# Patient Record
Sex: Female | Born: 1980 | Hispanic: No | Smoking: Former smoker
Health system: Southern US, Community
[De-identification: ages and names within clinical notes are randomized; demographics above are authoritative.]

## PROBLEM LIST (undated history)

## (undated) DIAGNOSIS — F419 Anxiety disorder, unspecified: Secondary | ICD-10-CM

## (undated) DIAGNOSIS — M545 Low back pain, unspecified: Secondary | ICD-10-CM

## (undated) DIAGNOSIS — Z87442 Personal history of urinary calculi: Secondary | ICD-10-CM

## (undated) DIAGNOSIS — K429 Umbilical hernia without obstruction or gangrene: Secondary | ICD-10-CM

## (undated) DIAGNOSIS — R10A0 Flank pain, unspecified side: Secondary | ICD-10-CM

## (undated) DIAGNOSIS — R109 Unspecified abdominal pain: Secondary | ICD-10-CM

## (undated) DIAGNOSIS — R002 Palpitations: Secondary | ICD-10-CM

## (undated) HISTORY — DX: Personal history of urinary calculi: Z87.442

## (undated) HISTORY — PX: NOSE SURGERY: SHX723

## (undated) HISTORY — DX: Anxiety disorder, unspecified: F41.9

## (undated) HISTORY — PX: INDUCED ABORTION: SHX677

## (undated) HISTORY — PX: APPENDECTOMY: SHX54

## (undated) HISTORY — DX: Palpitations: R00.2

## (undated) HISTORY — DX: Flank pain, unspecified side: R10.A0

## (undated) HISTORY — DX: Unspecified abdominal pain: R10.9

## (undated) HISTORY — DX: Umbilical hernia without obstruction or gangrene: K42.9

## (undated) HISTORY — PX: COSMETIC SURGERY: SHX468

## (undated) HISTORY — PX: LIPOSUCTION: SHX10

## (undated) HISTORY — DX: Low back pain, unspecified: M54.50

---

## 2011-02-15 ENCOUNTER — Other Ambulatory Visit: Payer: Self-pay | Admitting: *Deleted

## 2011-02-15 DIAGNOSIS — N23 Unspecified renal colic: Secondary | ICD-10-CM

## 2011-02-16 ENCOUNTER — Other Ambulatory Visit: Payer: Self-pay

## 2011-02-17 ENCOUNTER — Ambulatory Visit
Admission: RE | Admit: 2011-02-17 | Discharge: 2011-02-17 | Disposition: A | Discharge status: Home / Self Care | Payer: Self-pay | Source: Ambulatory Visit | Attending: *Deleted | Admitting: *Deleted

## 2011-02-17 DIAGNOSIS — N23 Unspecified renal colic: Secondary | ICD-10-CM

## 2014-12-23 ENCOUNTER — Other Ambulatory Visit (HOSPITAL_COMMUNITY)
Admission: RE | Admit: 2014-12-23 | Discharge: 2014-12-23 | Disposition: A | Discharge status: Home / Self Care | Payer: Self-pay | Source: Ambulatory Visit | Attending: Family Medicine | Admitting: Family Medicine

## 2014-12-23 ENCOUNTER — Emergency Department (HOSPITAL_COMMUNITY)
Admission: EM | Admit: 2014-12-23 | Discharge: 2014-12-23 | Disposition: A | Discharge status: Home / Self Care | Payer: Self-pay | Source: Home / Self Care | Attending: Family Medicine | Admitting: Family Medicine

## 2014-12-23 ENCOUNTER — Encounter (HOSPITAL_COMMUNITY): Payer: Self-pay | Admitting: Emergency Medicine

## 2014-12-23 DIAGNOSIS — N760 Acute vaginitis: Secondary | ICD-10-CM

## 2014-12-23 DIAGNOSIS — N12 Tubulo-interstitial nephritis, not specified as acute or chronic: Secondary | ICD-10-CM

## 2014-12-23 DIAGNOSIS — N939 Abnormal uterine and vaginal bleeding, unspecified: Secondary | ICD-10-CM

## 2014-12-23 DIAGNOSIS — Z113 Encounter for screening for infections with a predominantly sexual mode of transmission: Secondary | ICD-10-CM | POA: Insufficient documentation

## 2014-12-23 DIAGNOSIS — N730 Acute parametritis and pelvic cellulitis: Secondary | ICD-10-CM

## 2014-12-23 LAB — POCT PREGNANCY, URINE: Preg Test, Ur: NEGATIVE

## 2014-12-23 LAB — POCT URINALYSIS DIP (DEVICE)
BILIRUBIN URINE: NEGATIVE
Glucose, UA: NEGATIVE mg/dL
KETONES UR: NEGATIVE mg/dL
Nitrite: POSITIVE — AB
Protein, ur: 100 mg/dL — AB
Specific Gravity, Urine: >=1.03 (ref 1.005–1.030)
Urobilinogen, UA: 0.2 mg/dL (ref 0.0–1.0)
pH: 6 (ref 5.0–8.0)

## 2014-12-23 MED ORDER — CEFTRIAXONE SODIUM 1 G IJ SOLR
1.0000 g | Freq: Once | INTRAMUSCULAR | Status: AC
  Administered 2014-12-23: 1 g via INTRAMUSCULAR

## 2014-12-23 MED ORDER — AZITHROMYCIN 250 MG PO TABS
ORAL_TABLET | ORAL | Status: AC
  Filled 2014-12-23: qty 4

## 2014-12-23 MED ORDER — CEFTRIAXONE SODIUM 1 G IJ SOLR
INTRAMUSCULAR | Status: AC
Start: 2014-12-23 — End: 2014-12-23
  Filled 2014-12-23: qty 10

## 2014-12-23 MED ORDER — LIDOCAINE HCL (PF) 1 % IJ SOLN
INTRAMUSCULAR | Status: AC
  Filled 2014-12-23: qty 5

## 2014-12-23 MED ORDER — AZITHROMYCIN 250 MG PO TABS
1000.0000 mg | ORAL_TABLET | Freq: Every day | ORAL | Status: DC
  Administered 2014-12-23: 1000 mg via ORAL

## 2014-12-23 MED ORDER — CEPHALEXIN 500 MG PO CAPS: 500.0000 mg | ORAL_CAPSULE | Freq: Four times a day (QID) | ORAL | Status: DC

## 2014-12-23 MED ORDER — TRAMADOL HCL 50 MG PO TABS: ORAL_TABLET | ORAL | Status: DC

## 2014-12-23 NOTE — ED Notes (Addendum)
C/o RLQ pain that radiates to right flank x1 week Also reports white vag d/c and fevers; d/c has a foul odor to it Denies dysuria, hematuria, n/v Alert, no signs of acute distress.

## 2014-12-23 NOTE — Discharge Instructions (Signed)
Cervicitis (Cervicitis) Es el dolor e hinchazn (inflamacin) del cuello uterino. El cuello del tero se encuentra en la parte inferior del tero. Se abre hacia la vagina. CAUSAS   Enfermedades de transmisin sexual (ETS).   Reacciones alrgicas.   Medicamentos o dispositivos anticonceptivos que se Research officer, trade unioncolocan en la vagina.   Traumatismo en el cuello del tero.   Infecciones bacterianas.  FACTORES DE RIESGO Usted tendr mayor riesgo de sufrir esta enfermedad si:  Tiene relaciones sexuales sin proteccin.  Ha tenido relaciones sexuales con varias parejas.  Comienza a Management consultanttener relaciones sexuales a una edad temprana.  Tiene una historia de ETS. SNTOMAS  Puede ser que no haya sntomas. Si hay sntomas, pueden ser:   Secrecin vaginal de color gris, blanco, amarillo o que tiene mal olor.   Picazn o dolor en la zona externa de la vagina.   Relaciones sexuales dolorosas.   Dolor en la zona baja del abdomen o la espalda, especialmente durante las The St. Paul Travelersrelaciones sexuales.   Ganas de orinar con frecuencia.   Sangrado vaginal anormal entre perodos, despus de las relaciones sexuales o despus de la menopausia.   Sensacin de presin o pesadez en la pelvis.  DIAGNSTICO  El diagnstico se realiza mediante un examen plvico. Otras pruebas son:   Examen de las secreciones en el microscopio (preparado fresco).   Papanicolau  TRATAMIENTO  El tratamiento depender de la causa de la cervicitis. Si la causa es una enfermedad de transmisin sexual, usted y su pareja necesitarn Pensions consultantrealizar un tratamiento. Le prescribirn antibiticos.  INSTRUCCIONES PARA EL CUIDADO EN EL HOGAR   No tenga relaciones sexuales hasta que el mdico la autorice.   No tenga relaciones sexuales hasta que su compaero haya sido tratado si la causa de la cervicitis es una enfermedad de transmisin sexual.   Tome los antibiticos como se le indic. Tmelos todos, aunque se sienta mejor.  SOLICITE  ATENCIN MDICA SI:  Los sntomas vuelven a Research officer, trade unionaparecer.   Tiene fiebre.  ASEGRESE DE QUE:   Comprende estas instrucciones.  Controlar su afeccin.  Recibir ayuda de inmediato si no mejora o si empeora. Document Released: 10/30/2005 Document Revised: 07/02/2013 [facility] Patient Information 2015 [place], [place]. This information is not intended to replace advice given to you by your health care provider. Make sure you discuss any questions you have with your health care provider.  Enfermedad inflamatoria plvica (Pelvic Inflammatory Disease)  La enfermedad inflamatoria plvica (PID) se refiere a una infeccin en algunos o todos de los rganos femeninos. La infeccin puede estar en el tero, en los ovarios, en las trompas de [place] o en los tejidos circundantes en la pelvis. La PID puede causar un dolor abdominal o plvico que aparece repentinamente (dolor plvico agudo). La PID es una infeccin grave, ya que puede conducir a un dolor plvico duradero (crnico) o a la imposibilidad de Warehouse managertener hijos (infertilidad).  CAUSAS  La infeccin generalmente es causada por las bacterias normales que se encuentran en los tejidos vaginales. Otras causas son las infecciones que se transmiten durante el contacto sexual. Tambin puede aparecer despus de:   El nacimiento de un beb.   Un aborto espontneo.   Un aborto inducido.   Una ciruga plvica mayor.   El uso de un dispositivo intrauterino (DIU).   Una violacin sexual.  FACTORES DE RIESGO  Ciertos factores pueden aumentar el riesgo de PID, por ejemplo:   Ser menor de 25 aos de edad.  Ser sexualmente activa a una edad temprana.  El Home Gardensuso de  anticonceptivos que no son de barrera.  Tener mltiples compaeros sexuales.  Tener relaciones sexuales con alguien que tiene sntomas de una infeccin genital.  El uso de anticonceptivos orales. Otras veces, ciertos factores pueden aumentar la posibilidad de sufrir una PID, por ejemplo:     Tener relaciones sexuales durante la menstruacin.  Usar una ducha vaginal.  Tener un DIU (dispositivo intrauterino) en su lugar. SNTOMAS   Dolor abdominal o plvico   Fiebre.   Escalofros.   Flujo vaginal anormal.  Sangrado uterino anormal.   Dolor inusual poco despus de finalizado el perodo. DIAGNSTICO  El mdico decidir algunos de los siguientes mtodos para hacer el diagnstico:   Education officer, environmental un examen fsico y Neomia Dear historia clnica. El examen plvico muestra el tero y esa zona de la pelvis muy sensibles.   Pruebas de laboratorio, como un test de [place], [place] de [place] y de [place].  Cultivos de la vagina y del cuello uterino para Engineer, manufacturing una infeccin de transmisin sexual (ITS).  Realizar una ecografa.  Una laparoscopia para observar el interior de la pelvis.  TRATAMIENTO   Se pueden prescribir antibiticos por va oral.   Las parejas sexuales deben tratarse cuando la infeccin es [place] en una enfermedad de transmisin sexual (ETS).   Puede que necesite ser hospitalizada para aplicarle los antibiticos por va intravenosa.  En algunos casos poco frecuentes es necesaria la [place]. Pueden pasar semanas hasta la completa curacin. Si se le diagnostica una PID, tambin debern realizarle estudios para descartar el virus de inmunodeficiencia humana (VIH).  INSTRUCCIONES PARA EL CUIDADO EN EL HOGAR   Tome los antibiticos como le indic el mdico, si se los receta. Finalice el [place], aunque comience a Actor.   Slo tome medicamentos de venta libre o recetados para Primary school teacher, las molestias o bajar la fiebre segn las indicaciones de su mdico.   No tenga relaciones sexuales hasta completar el [place], o segn las indicaciones del mdico. Si se confirma la PID, sus compaeros sexuales recientes Therapist, occupational.   Cumpla con las citas tal como se le indic. SOLICITE ATENCIN MDICA SI:   Brett Fairy secrecin vaginal anormal o abundante.   Necesita medicamentos recetados para Chief Technology Officer.   Vomita.   No puede tomar los medicamentos.   Su pareja tiene una enfermedad de transmisin sexual.  SOLICITE ATENCIN MDICA DE Lanney Gins SI:   Lance Muss.   Aumenta el dolor abdominal o plvico.   Siente escalofros.   Siente dolor al [employer].   No mejora luego de 72 horas del tratamiento.  ASEGRESE DE QUE:   Comprende estas instrucciones.  Controlar su enfermedad.  Solicitar ayuda de inmediato si no mejora o si empeora. Document Released: 08/09/2005 Document Revised: 02/24/2013 [facility] Patient Information 2015 [place], [place]. This information is not intended to replace advice given to you by your health care provider. Make sure you discuss any questions you have with your health care provider.  Pielonefritis - Adultos  (Pyelonephritis, Adult)  La pielonefritis es una infeccin del rin. Hay dos tipos principales de pielonefritis:   Una infeccin que se inicia rpidamente sin sntomas previos (pielonefritis [place]).  Infecciones que persisten por un largo perodo (pielonefritis crnica). CAUSAS  Hay dos causas principales:   Pasaje de bacterias desde la vejiga al rin. Este problema aparece especialmente en mujeres embarazadas. La orina en la vejiga puede infectarse por diferentes causas, por ejemplo:  Inflamacin de la prstata (prostatitis).  Durante las [employer].  Infeccin en la vejiga (cistitis).  Pasaje de bacterias desde la sangre hacia el rin. Las enfermedades que aumentan el riesgo son:   Diabetes.  Clculos renales o en la vescula.  Cncer.  Un catter colocado en la vejiga.  Otras anormalidades del rin o de Engineer, mining. SNTOMAS   Dolor abdominal  Dolor en la zona del costado o flanco.  Grant Ruts.  Escalofros.  Malestar estomacal.  Sangre en la orina Larose Kells).  Necesidad frecuente de  orinar  Necesidad intensa o persistente de Geographical information systems officer.  Sensacin de ardor o pinchazos al [employer]. DIAGNSTICO  El mdico diagnosticar una infeccin en su rin basndose en los sntomas. Tambin tomar [place] de [place].  TRATAMIENTO  Generalmente el tratamiento depende de la gravedad de la infeccin.   Si la infeccin es leve y se diagnostica a tiempo, el mdico lo tratar con antibiticos por va oral y lo dejar irse a su casa.  Si la infeccin es ms grave, la bacteria podra haber ingresado al torrente sanguneo. Esto requerir antibiticos por va intravenosa y Administrator, arts en el hospital. Los sntomas pueden incluir:  Fiebre alta.  Dolor intenso en un costado del cuerpo.  Escalofros  An despus de Financial risk analyst hospital, el mdico podr indicarle antibiticos por va oral durante cierto perodo de [place].  Podr prescribirle otros tratamientos segn la causa de la infeccin. INSTRUCCIONES PARA EL CUIDADO EN EL HOGAR   Tome los antibiticos como se le indic. Tmelos todos, aunque se sienta mejor.  Concurra para Education officer, environmental un control y asegurarse de que la infeccin ha desaparecido.  Debe ingerir gran cantidad de lquido para mantener la orina de tono claro o color amarillo plido.  Tome medicamentos para la vejiga si siente urgencia para Geographical information systems officer o lo hace con mucha frecuencia. SOLICITE ATENCIN MDICA DE INMEDIATO SI:   Tiene fiebre o sntomas persistentes durante ms de 2  3 das.  Tiene fiebre y los sntomas [address].  No puede tomar los antibiticos ni ingerir lquidos.  Comienza a sentir escalofros.  Siente debilidad extrema o se desmaya.  No mejora despus de 2 das de [place]. ASEGRESE DE QUE:   Comprende estas instrucciones.  Controlar su enfermedad.  Solicitar ayuda de inmediato si no mejora o empeora. Document Released: 08/09/2005 Document Revised: 04/30/2012 [facility] Patient Information 2015 [place], [place]. This information is  not intended to replace advice given to you by your health care provider. Make sure you discuss any questions you have with your health care provider.  Infeccin urinaria  (Urinary Tract Infection)  La infeccin urinaria puede ocurrir en Corporate treasurer del tracto urinario. El tracto urinario es un sistema de drenaje del cuerpo por el que se eliminan los desechos y el exceso de [place]. El tracto urinario est formado por dos riones, dos urteres, la vejiga y Engineer, mining. Los riones son rganos que tienen forma de frijol. Cada rin tiene aproximadamente el tamao del puo. Estn situados debajo de las [place], uno a cada lado de la columna vertebral CAUSAS  La causa de la infeccin son los microbios, que son organismos microscpicos, que incluyen hongos, virus, y bacterias. Estos organismos son tan pequeos que slo pueden verse a travs del microscopio. Las bacterias son los microorganismos que ms comnmente causan infecciones urinarias.  SNTOMAS  Los sntomas pueden variar segn la edad y el sexo del paciente y por la ubicacin de la infeccin. Los sntomas en las mujeres jvenes incluyen la necesidad frecuente e intensa de orinar y Neomia Dear sensacin dolorosa  de ardor en la vejiga o en la uretra durante la miccin. Las mujeres y los hombres mayores podrn sentir cansancio, temblores y debilidad y Futures trader musculares y Engineer, mining abdominal. Si tiene [place], puede significar que la infeccin est en los riones. Otros sntomas son dolor en la espalda o en los lados debajo de las [place], nuseas y vmitos.  DIAGNSTICO  Para diagnosticar una infeccin urinaria, el mdico le preguntar acerca de sus sntomas. [employer] una [place] de [place]. La muestra de orina se analiza para Engineer, manufacturing bacterias y glbulos blancos de Risk manager. Los glbulos blancos se forman en el organismo para ayudar a Artist las infecciones.  TRATAMIENTO  Por lo general, las infecciones urinarias pueden tratarse con  medicamentos. Debido a que la [employer] de las infecciones son causadas por bacterias, por lo general pueden tratarse con antibiticos. La eleccin del antibitico y la duracin del tratamiento depender de sus sntomas y el tipo de bacteria causante de la infeccin.  INSTRUCCIONES PARA EL CUIDADO EN EL HOGAR   Si le recetaron antibiticos, tmelos exactamente como su mdico le indique. Termine el medicamento aunque se sienta mejor despus de haber tomado slo algunos.  Beba gran cantidad de lquido para mantener la orina de tono claro o color amarillo plido.  Evite la cafena, el t y las [address]. Estas sustancias irritan la vejiga.  Vaciar la vejiga con frecuencia. Evite retener la orina durante largos perodos.  Vace la vejiga antes y despus de Management consultant.  Despus de mover el intestino, las mujeres deben higienizarse la regin perineal desde adelante hacia atrs. Use slo un papel tissue por vez. SOLICITE ATENCIN MDICA SI:   Siente dolor en la espalda.  Le sube la fiebre.  Los sntomas no mejoran luego de [address]. SOLICITE ATENCIN MDICA DE INMEDIATO SI:   Siente dolor intenso en la espalda o en la zona inferior del abdomen.  Comienza a sentir escalofros.  Tiene nuseas o vmitos.  Tiene una sensacin continua de quemazn o molestias al [employer]. ASEGRESE DE QUE:   Comprende estas instrucciones.  Controlar su enfermedad.  Solicitar ayuda de inmediato si no mejora o empeora. Document Released: 08/09/2005 Document Revised: 07/24/2012 [facility] Patient Information 2015 [place], [place]. This information is not intended to replace advice given to you by your health care provider. Make sure you discuss any questions you have with your health care provider.  Vaginitis  (Vaginitis)  La vaginitis es la inflamacin de la vagina. Generalmente se debe a un cambio en el equilibrio normal de las bacterias y hongos que viven en la vagina. Este cambio en el  equilibrio causa un crecimiento excesivo de ciertas bacterias y hongos, lo que causa la inflamacin. Hay diferentes tipos de vaginitis, pero los ms comunes son:   Vaginitis bacteriana.  Infeccin por hongos (candidiasis).  Vaginitis por tricomoniasis. Esta es una enfermedad de transmisin sexual (ETS).  Vaginitis viral.  Vaginitis atrfica.  Vaginitis alrgica. CAUSAS  El tratamiento depende del tipo de vaginitis. Las causas pueden ser:   Bacterias (vaginitis bacteriana).  Hongos (infeccin por hongos).  Parsitos (vaginitis por tricomoniasis).  Virus (vaginitis viral).  Niveles hormonales bajos (vaginitis atrfica). Los niveles bajos de hormonas pueden ocurrir durante el Psychiatrist, la Market researcher o despus de la menopausia.  Irritantes, como los baos de [place], los tampones perfumados y los aerosoles femeninos (vaginitis Counselling psychologist). Otros factores pueden alterar el equilibrio normal de los hongos y las bacterias que viven en la vagina. Ellos son:   Antibiticos.  Higiene personal deficiente.  Diafragmas, esponjas vaginales, espermicidas, pldoras anticonceptivas y dispositivos intrauterinos (DIU).  Las [employer].  Infecciones.  La diabetes no controlada.  Tener un sistema inmunolgico debilitado. SNTOMAS  Los sntomas pueden variar segn la causa de la vaginitis. Los sntomas ms comunes son:   Flujo vaginal anormal.  La secrecin es de color blanco, gris o amarillento en la vaginitis bacteriana.  La secrecin es espesa, blanca y con apariencia de queso en la infeccin por hongos.  La secrecin es espumosa y de color amarillo o verdoso en la tricomoniasis.  Mal olor vaginal.  En la vaginitis bacteriana puede haber olor a "pescado".  Picazn, dolor o hinchazn vaginal.  Relaciones sexuales dolorosas.  Dolor o ardor al Geographical information systems officer. En ocasiones puede no haber sntomas.  TRATAMIENTO  El tratamiento depende de la gravedad de la lesin.   La vaginitis  bacteriana y la tricomoniasis a menudo se tratan con cremas o comprimidos antibiticos.  Las infecciones por hongos se tratan con medicamentos antifngicos, como cremas o supositorios vaginales.  La vaginitis viral no tiene [place], [employer] los sntomas pueden tratarse con medicamentos que [place] [employer]. Su pareja sexual tambin debe ser tratarse.  La vaginitis atrfica puede tratarse con crema, comprimidos, supositorios, o anillo vaginal con estrgenos. Si tiene sequedad vaginal, los lubricantes y las cremas hidratantes pueden ayudarla. Posiblemente le pedirn que evite los [place], aerosoles o duchas perfumados.  El tratamiento de la vaginitis alrgica implica renunciar al uso del producto que est causando el problema. Las cremas vaginales pueden usarse para tratar los sntomas. INSTRUCCIONES PARA EL CUIDADO EN EL HOGAR   Tome todos los medicamentos segn le indic su mdico.  Mantenga la zona vaginal limpia y seca. Evite el jabn y slo enjuague el rea con agua.  Evite la ducha vaginal. Puede eliminar las bacterias saludables que hay en la vagina.  No utilice tampones ni tenga relaciones sexuales hasta que el profesional la autorice. No use apsitos mientras tenga vaginitis.  Higiencese de adelante hacia atrs. Esto evita la propagacin de bacterias desde el recto hacia la vagina.  Deje que el aire llegue a su rea genital.  Use ropa interior de algodn para reducir la acumulacin de humedad.  Evite el uso de ropa interior cuando duerme hasta que la vaginitis haya mejorado.  Evite la ropa interior o medias de nylon ajustadas y que no tengan un panel de algodn.  Qutese la ropa hmeda (especialmente el traje de bao) tan pronto como sea posible.  Utilice productos suaves sin perfume. Evite el uso de sustancias irritantes como:  Aerosoles femeninos perfumados.  Suavizantes de tela.  Detergentes perfumados.  Tampones perfumados.  Jabones o baos de espuma  perfumados.  Practique el sexo seguro y use condones. Los condones pueden prevenir la transmisin de la tricomoniasis y la vaginitis viral. Romona Curls ATENCIN MDICA SI:   Siente dolor abdominal.  Tiene fiebre o sntomas persistentes durante ms de 2  3 das.  Tiene fiebre y los sntomas empeoran repentinamente. Document Released: 02/15/2009 Document Revised: 07/24/2012 [facility] Patient Information 2015 [place], [place]. This information is not intended to replace advice given to you by your health care provider. Make sure you discuss any questions you have with your health care provider.

## 2014-12-23 NOTE — ED Provider Notes (Signed)
CSN: [id number]     Arrival date & time 12/23/14  1621 History   First MD Initiated Contact with Patient 12/23/14 1654     Chief Complaint  Patient presents with  . Abdominal Pain   (Consider location/radiation/quality/duration/timing/severity/associated sxs/prior Treatment) HPI Comments: 34 year old female states that she has had pain across the lower back for 3 days. It was primarily to the right side and migrated to the left. She states it is worse it was 10 out of 10. After taking ibuprofen this afternoon it is now 8 out of 10. It is often worse with movement such as leaning forward and with ambulation. She is complaining of recurrent vaginal discharge. She has been treated for vaginal discharge and urinary tract infection several times over the past few months. In December she had an elective abortion. She denies dysuria. Her LMP was 1 week ago. At the end of the exam and decision making portion the patient stated that she took a "day after peel" 2 days ago.   History reviewed. No pertinent past medical history. History reviewed. No pertinent past surgical history. No family history on file. History  Substance Use Topics  . Smoking status: Current Every Day Smoker  . Smokeless tobacco: Not on file  . Alcohol Use: Yes   OB History    No data available     Review of Systems  Constitutional: Negative for fever, chills and activity change.  HENT: Negative.   Respiratory: Negative.   Cardiovascular: Negative for chest pain.  Gastrointestinal: Positive for abdominal pain and constipation. Negative for vomiting.  Genitourinary: Positive for flank pain, vaginal bleeding, vaginal discharge and pelvic pain. Negative for dysuria, urgency, frequency and menstrual problem.  Skin: Negative.   Neurological: Negative.     Allergies  Review of patient's allergies indicates no known allergies.  Home Medications   Prior to Admission medications   Medication Sig Start Date End Date Taking?  Authorizing Provider  cephALEXin (KEFLEX) 500 MG capsule Take 1 capsule (500 mg total) by mouth 4 (four) times daily. 12/23/14   Hayden Rasmussen, NP  traMADol (ULTRAM) 50 MG tablet 1-2 tabs po q 6 hr prn pain Maximum dose= 8 tablets per day 12/23/14   Hayden Rasmussen, NP   BP 108/70 mmHg  Pulse 80  Temp(Src) 98.2 F (36.8 C) (Oral)  Resp 16  SpO2 99%  LMP 12/17/2014 Physical Exam  Constitutional: She is oriented to person, place, and time. She appears well-developed and well-nourished. No distress.  Neck: Normal range of motion. Neck supple.  Cardiovascular: Normal rate, regular rhythm and normal heart sounds.   Pulmonary/Chest: Effort normal and breath sounds normal. No respiratory distress.  No tenderness to palpation to the back. Positive CVA tenderness bilaterally.  Abdominal: Soft. Bowel sounds are normal. She exhibits no distension and no mass. There is no rebound and no guarding.  Abdomen flat Minor generalized tenderness to include the right upper quadrant. Mild tenderness to the anterior pelvis.  Genitourinary:  Normal external female genitalia. There is a moderate amount of dark maroon blood with clots in the vaginal vault. The cervix is small and right of the midline. There is blood oozing slowly from the os. No cervical/echo cervical lesions seen. The blood was removed with Fox swabs. Bimanual reveals bilateral adnexal tenderness and cervical motion tenderness. There was a scant amount of white vaginal discharge but primarily deluded by the amount of blood.  Musculoskeletal: She exhibits no edema.  Neurological: She is alert and oriented to person, place,  and time. She exhibits normal muscle tone.  Skin: Skin is warm and dry.  Psychiatric: She has a normal mood and affect.  Nursing note and vitals reviewed.   ED Course  Procedures (including critical care time) Labs Review Labs Reviewed  POCT URINALYSIS DIP (DEVICE) - Abnormal; Notable for the following:    Hgb urine dipstick  MODERATE (*)    Protein, ur 100 (*)    Nitrite POSITIVE (*)    Leukocytes, UA LARGE (*)    All other components within normal limits  URINE CULTURE  POCT PREGNANCY, URINE  CERVICOVAGINAL ANCILLARY ONLY    Imaging Review No results found. Results for orders placed or performed during the hospital encounter of 12/23/14  POCT urinalysis dip (device)  Result Value Ref Range   Glucose, UA NEGATIVE NEGATIVE mg/dL   Bilirubin Urine NEGATIVE NEGATIVE   Ketones, ur NEGATIVE NEGATIVE mg/dL   Specific Gravity, Urine >=1.030 1.005 - 1.030   Hgb urine dipstick MODERATE (A) NEGATIVE   pH 6.0 5.0 - 8.0   Protein, ur 100 (A) NEGATIVE mg/dL   Urobilinogen, UA 0.2 0.0 - 1.0 mg/dL   Nitrite POSITIVE (A) NEGATIVE   Leukocytes, UA LARGE (A) NEGATIVE  Pregnancy, urine POC  Result Value Ref Range   Preg Test, Ur NEGATIVE NEGATIVE   Preg HCG negative.    MDM   1. Pyelonephritis   2. PID (acute pelvic inflammatory disease)   3. Recurrent vaginitis   4. Abnormal uterine bleeding (AUB)    Rocephin 1 g IM Azithromycin 1 g by mouth Prescription for Keflex 4 times a day Urine culture pending Cervical cytology pending Drink plenty of fluids, stay well-hydrated , Tramadol 50 mg #20 May take ibuprofen for pain when necessary Call your doctor for follow-up appointment to see later this week or early next week. For any worsening new symptoms or problems such as increased pain, increased bleeding, fevers or other symptoms [facility].    Hayden Rasmussenavid Fines Kimberlin, NP 12/23/14 [id number]

## 2014-12-24 LAB — CERVICOVAGINAL ANCILLARY ONLY
CHLAMYDIA, DNA PROBE: NEGATIVE
Neisseria Gonorrhea: NEGATIVE

## 2014-12-25 LAB — CERVICOVAGINAL ANCILLARY ONLY
WET PREP (BD AFFIRM): NEGATIVE
WET PREP (BD AFFIRM): NEGATIVE
Wet Prep (BD Affirm): NEGATIVE

## 2015-01-01 ENCOUNTER — Emergency Department (HOSPITAL_COMMUNITY)
Admission: EM | Admit: 2015-01-01 | Discharge: 2015-01-02 | Disposition: A | Discharge status: Home / Self Care | Payer: PRIVATE HEALTH INSURANCE | Attending: Emergency Medicine | Admitting: Emergency Medicine

## 2015-01-01 ENCOUNTER — Encounter (HOSPITAL_COMMUNITY): Payer: Self-pay | Admitting: Emergency Medicine

## 2015-01-01 DIAGNOSIS — B373 Candidiasis of vulva and vagina: Secondary | ICD-10-CM | POA: Insufficient documentation

## 2015-01-01 DIAGNOSIS — B3731 Acute candidiasis of vulva and vagina: Secondary | ICD-10-CM

## 2015-01-01 DIAGNOSIS — Z792 Long term (current) use of antibiotics: Secondary | ICD-10-CM | POA: Diagnosis not present

## 2015-01-01 DIAGNOSIS — Z3202 Encounter for pregnancy test, result negative: Secondary | ICD-10-CM | POA: Insufficient documentation

## 2015-01-01 DIAGNOSIS — R300 Dysuria: Secondary | ICD-10-CM | POA: Diagnosis present

## 2015-01-01 LAB — CBC
HCT: 39.5 % (ref 36.0–46.0)
HEMOGLOBIN: 12.9 g/dL (ref 12.0–15.0)
MCH: 30 pg (ref 26.0–34.0)
MCHC: 32.7 g/dL (ref 30.0–36.0)
MCV: 91.9 fL (ref 78.0–100.0)
PLATELETS: 230 10*3/uL (ref 150–400)
RBC: 4.3 MIL/uL (ref 3.87–5.11)
RDW: 13.1 % (ref 11.5–15.5)
WBC: 7.2 10*3/uL (ref 4.0–10.5)

## 2015-01-01 LAB — URINALYSIS, ROUTINE W REFLEX MICROSCOPIC
BILIRUBIN URINE: NEGATIVE
Glucose, UA: NEGATIVE mg/dL
Hgb urine dipstick: NEGATIVE
KETONES UR: NEGATIVE mg/dL
LEUKOCYTES UA: NEGATIVE
NITRITE: NEGATIVE
PROTEIN: NEGATIVE mg/dL
Specific Gravity, Urine: 1.021 (ref 1.005–1.030)
Urobilinogen, UA: 0.2 mg/dL (ref 0.0–1.0)
pH: 6.5 (ref 5.0–8.0)

## 2015-01-01 LAB — COMPREHENSIVE METABOLIC PANEL
ALT: 31 U/L (ref 0–35)
ANION GAP: 6 (ref 5–15)
AST: 21 U/L (ref 0–37)
Albumin: 4 g/dL (ref 3.5–5.2)
Alkaline Phosphatase: 55 U/L (ref 39–117)
BUN: 16 mg/dL (ref 6–23)
CALCIUM: 8.9 mg/dL (ref 8.4–10.5)
CO2: 23 mmol/L (ref 19–32)
Chloride: 107 mmol/L (ref 96–112)
Creatinine, Ser: 0.65 mg/dL (ref 0.50–1.10)
GFR calc non Af Amer: >90 mL/min (ref >90)
GLUCOSE: 92 mg/dL (ref 70–99)
Potassium: 3.9 mmol/L (ref 3.5–5.1)
Sodium: 136 mmol/L (ref 135–145)
TOTAL PROTEIN: 7.9 g/dL (ref 6.0–8.3)
Total Bilirubin: 0.3 mg/dL (ref 0.3–1.2)

## 2015-01-01 LAB — WET PREP, GENITAL
CLUE CELLS WET PREP: NONE SEEN
Trich, Wet Prep: NONE SEEN

## 2015-01-01 LAB — PREGNANCY, URINE: Preg Test, Ur: NEGATIVE

## 2015-01-01 MED ORDER — FLUCONAZOLE 150 MG PO TABS
150.0000 mg | ORAL_TABLET | Freq: Once | ORAL | Status: AC
  Administered 2015-01-01: 150 mg via ORAL
  Filled 2015-01-01: qty 1

## 2015-01-01 NOTE — ED Notes (Signed)
PA at bedside.

## 2015-01-01 NOTE — ED Provider Notes (Signed)
CSN: [id number]     Arrival date & time 01/01/15  1758 History   First MD Initiated Contact with Patient 01/01/15 2002     Chief Complaint  Patient presents with  . Vaginal Discharge  . Dysuria   HPI  Patient is a 34 year old with no past medical history presents emergency room for evaluation of dysuria and vaginal discharge. Patient states that last week she was seen and evaluated by an outside doctor who diagnosed her with a UTI versus early pyelonephritis. Patient was given Toradol and also Keflex. She states that she has been taking her medications. She has 2 doses of Keflex left. She has noticed that for the past couple days she has been having an increasing white vaginal discharge. She also has noted that the dysuria has returned. Patient states that she has a history of yeast infections with antibiotic use. She is requesting STD testing as well. Patient denies fevers, chills, nausea, vomiting, abdominal pain, diarrhea, melena, hematochezia.  History reviewed. No pertinent past medical history. History reviewed. No pertinent past surgical history. No family history on file. History  Substance Use Topics  . Smoking status: Never Smoker   . Smokeless tobacco: Not on file  . Alcohol Use: No   OB History    No data available     Review of Systems  Respiratory: Negative for chest tightness and shortness of breath.   Cardiovascular: Negative for chest pain and palpitations.  Gastrointestinal: Negative for nausea, vomiting, abdominal pain, diarrhea and constipation.  Genitourinary: Positive for dysuria, urgency, frequency and vaginal discharge. Negative for hematuria, flank pain, decreased urine volume, vaginal bleeding, vaginal pain and pelvic pain.  All other systems reviewed and are negative.     Allergies  Review of patient's allergies indicates no known allergies.  Home Medications   Prior to Admission medications   Medication Sig Start Date End Date Taking? Authorizing  Provider  cephALEXin (KEFLEX) 500 MG capsule Take 500 mg by mouth 4 (four) times daily.   Yes Historical Provider, MD  ibuprofen (ADVIL,MOTRIN) 200 MG tablet Take 200 mg by mouth every 6 (six) hours as needed for moderate pain.   Yes Historical Provider, MD   BP 95/68 mmHg  Pulse 64  Temp(Src) 98 F (36.7 C) (Oral)  Resp 18  SpO2 97%  LMP 12/18/2014 Physical Exam  Constitutional: She is oriented to person, place, and time. She appears well-developed and well-nourished. No distress.  HENT:  Head: Normocephalic and atraumatic.  Mouth/Throat: Oropharynx is clear and moist. No oropharyngeal exudate.  Eyes: Conjunctivae and EOM are normal. Pupils are equal, round, and reactive to light. No scleral icterus.  Neck: Normal range of motion. Neck supple. No JVD present. No thyromegaly present.  Cardiovascular: Normal rate, regular rhythm, normal heart sounds and intact distal pulses.  Exam reveals no gallop and no friction rub.   No murmur heard. Pulmonary/Chest: Effort normal and breath sounds normal. No respiratory distress. She has no wheezes. She has no rales. She exhibits no tenderness.  Abdominal: Soft. Normal appearance and bowel sounds are normal. She exhibits no mass. There is no tenderness. There is no rigidity, no rebound, no guarding, no CVA tenderness, no tenderness at McBurney's point and negative Murphy's sign.  Genitourinary: Uterus normal. No labial fusion. There is no rash, tenderness, lesion or injury on the right labia. There is no rash, tenderness, lesion or injury on the left labia. Cervix exhibits no motion tenderness, no discharge and no friability. Right adnexum displays no mass, no  tenderness and no fullness. Left adnexum displays no mass, no tenderness and no fullness. No erythema, tenderness or bleeding in the vagina. No foreign body around the vagina. No signs of injury around the vagina. Vaginal discharge found.  Patient has a thick white discharge in the vaginal vault   Musculoskeletal: Normal range of motion.  Lymphadenopathy:    She has no cervical adenopathy.  Neurological: She is alert and oriented to person, place, and time.  Skin: Skin is warm and dry. She is not diaphoretic.  Psychiatric: She has a normal mood and affect. Her behavior is normal. Judgment and thought content normal.  Nursing note and vitals reviewed.   ED Course  Procedures (including critical care time) Labs Review Labs Reviewed  WET PREP, GENITAL - Abnormal; Notable for the following:    Yeast Wet Prep HPF POC FEW (*)    WBC, Wet Prep HPF POC FEW (*)    All other components within normal limits  URINALYSIS, ROUTINE W REFLEX MICROSCOPIC - Abnormal; Notable for the following:    APPearance CLOUDY (*)    All other components within normal limits  CBC  COMPREHENSIVE METABOLIC PANEL  PREGNANCY, URINE  RPR  HIV ANTIBODY (ROUTINE TESTING)  GC/CHLAMYDIA PROBE AMP ([facility])    Imaging Review No results found.   EKG Interpretation None      MDM   Final diagnoses:  Vaginal candidiasis   Patient is a 34 year old female who presents emergency room for evaluation of vaginal discharge and dysuria. Physical exam reveals an alert and nontoxic-appearing female. There is a thick white vaginal discharge on pelvic exam. There is no cervical motion tenderness, friability, adnexal tenderness or mass. Patient was recently treated for urinary tract infection with Keflex and has 2 doses left. Patient has history very consistent with yeast infection. Wet prep does show few use. Urinalysis reveals no evidence of UTI. CBC, CMP, urine pregnancy are negative and unremarkable. GC, RPR, and HIV are currently pending. We'll treat the patient here with 150 mg of Diflucan. We'll refer the patient to [facility] outpatient office. Patient is stable for discharge at this time.    Eben Burow, PA-C 01/02/15 0006  Doug Sou, MD 01/02/15 0030

## 2015-01-01 NOTE — ED Notes (Signed)
Pt c/o vaginal discharge x 7 months, foul odor, more after sex.  Pt also thinks she is starting to get a UTI. Pt was treated one week ago for pyelonephritis. Pt sts she is having lower abdominal cramping "like period cramping" but she shouldn't be on her period. Pt sts she has blood in underwear at times but doesn't know "which hole it is coming from." A&Ox4. Ambulatory with Triage. Pt also sts that she gets vaginal infections after having sex with her partner of 5 years. Pt sts that they have unprotected sex and she is worried "they have something." Pt would like to be treated.

## 2015-01-01 NOTE — Discharge Instructions (Signed)
Infección por cándida °(Candida Infection) °Una infección por Cándida (también llamado infección por hongos levaduriformes, o infección por Monilia) es un crecimiento excesivo de hongos que puede ocurrir en cualquier parte del cuerpo. Una infección por cándida comúnmente ocurre en las zonas más calientes y húmedas del cuerpo. Usualmente, la infección permanece localizada pero puede diseminarse y convertirse en una infección sistémica. Una infección por cándida puede ser signo de un trastorno más grave como diabetes, leucemia, o SIDA. °Una infección por cándida puede ocurrir tanto en hombres como en mujeres. En mujeres, la vaginitis Cándida es una infección vaginal. Se trata de una de las causas más frecuentes de la vaginitis. Los hombres no suelen tener síntomas hasta que se le aparecen otros problemas. Pueden descubrir que tienen una infección por hongos porque su compañera sexual los tiene. Es más probable que los hombres no circuncisos adquieran una infección por hongo que aquellos que están circuncindados. Esto se debe a que el glande no circuncidado no está expuesto al aire y no se mantiene tan seco como un glande circuncidado. Los adultos mayores pueden desarrollar infecciones por cándida en la zona de la dentadura. °CAUSAS °Mujeres °· Antibióticos. °· Medicamento con esteroides durante mucho tiempo. °· Tener sobrepeso (obesidad). °· Diabetes. °· Sistema inmune deficiente. °· Ciertas enfermedades. °· Medicamentos inmunosupresores para pacientes con trasplante de órganos. °· Quimioterapia. °· El embarazo. °· Menstruación. °· Estrés o fatiga. °· Consumo de drogas de forma intravenosa. °· Anticonceptivos orales. °· Utilizar ropa ajustada en la zona de la entrepierna. °· Contagio a partir de un compañero sexual que ya tiene la infección. °· Los espermicidas. °· Catéteres intravenosos, urinarios, u de otro tipo. °Hombres °· Contagio a partir de una compañera sexual que ya tiene la infección. °· Tener sexo oral o  anal con una persona que tiene la infección. °· Los espermicidas. °· Diabetes. °· Antibióticos. °· Sistema inmune deficiente. °· Medicamentos que suprimen el sistema inmune. °· El uso de drogas por vía intravenosa. °· Intravenosa, urinarias o catéteres otros. °SÍNTOMAS °Mujeres °· Flujo vaginal espeso y blanco. °· Picazón vaginal. °· Enrojecimiento e hinchazón en la zona de la vagina. °· Irritación de los labios de la vagina y perineo. °· Úlceras en labios vaginales y perineo. °· Relaciones sexuales dolorosas. °· Bajo nivel de azúcar en la sangre (hipoglucemia). °· Dolor al orinar. °· Infecciones en la vejiga. °· Problemas intestinales como constipación, indigestión, mal aliento, hinchazón, gases, diarrea o heces blandas. °Hombres °· Primero los hombres desarrollan problemas intestinales como constipación, indigestión, mal aliento, hinchazón, gases, diarrea o heces blandas. °· Piel del pene seca y quebradiza con picazón o molestias. °· Prurito de la ingle. °· Piel seca y escamosa. °· Pie de atleta. °· Hipoglucemia. °DIAGNÓSTICO °Mujeres °· Se revisa el historial y se realiza un análisis. °· La supuración se observa con un microscopio. °· Deberá tomarse un cultivo de la secreción. °Hombres °· Se revisa el historial y se realiza un análisis. °· Se observará cualquier secreción proveniente del pene y la piel quebrada en el microscopio y se realizará un cultivo. °· Podrán realizarle cultivos de heces. °TRATAMIENTO °Mujeres °· Supositorios y cremas antifúngicos vaginales. °· Cremas medicadas para disminuir la picazón e irritación de la zona externa de la vagina. °· Aplique una bolsa caliente en la zona del perineo para disminuir molestias o inflamación. °· Medicamentos antifúngicos orales. °· Supositorios vaginales o cremas medicinales, para infecciones repetidas o recurrentes. °· Lave y seque la zona por completo antes de aplicar la crema. °· La ingesta de yogur   con lactobacillus le ayudar con la prevencin y [place].  Si las cremas y supositorios no funcionan, la aplicacin de solucin violeta de genciana puede ayudar. Hombres  Cremas anti hongos y medicamentos orales anti hongos.  A veces el tratamiento deber continuar por 30 das despus de que los sntomas desaparecen para prevenir la recurrencia. INSTRUCCIONES PARA EL CUIDADO DOMICILIARIO Mujeres  Utilice ropa interior de algodn y evite las ropas ajustadas.  Evite el papel higinico de color o perfumado y los tampones o toallitas con desodorante.  No utilice duchas vaginales.  Mantenga la diabetes bajo control.  Tome todos los medicamentos tal como se le indic.  Mantenga su piel limpia y [place].  Consuma leche o yogur con lactobacillus de [place] regular. Si contrae infecciones por levaduras frecuentes y cree saber cul es la infeccin, existen medicamentos de [place]. Si la infeccin no parece curarse en 3 das, hable con el mdico.  Comunique a su compaero sexual que padece una infeccin por hongos. El tambin Network engineer, en especial si la infeccin no desaparece o es recurrente. Hombres  Mantenga su piel limpia y [place].  Mantenga la diabetes bajo control.  Tome todos los medicamentos tal como se le indic.  Comunique a su compaera sexual que sufre una infeccin por hongos. SOLICITE ANTENCIN MDICA SI:  Los sntomas no desaparecen o empeoran luego de 1 semana de tratamiento.  Usted tiene una temperatura oral de ms de 38,9 C (102 F).  Presenta dificultades para tragar o para comer durante un tiempo prolongado.  Aparecen ampollas en los genitales.  Si aparece una hemorragia vaginal y no es el momento del perodo.  Siente dolor abdominal.  Presentara problemas intestinales como los ya descritos.  Si se siente dbil o desfalleciente.  Siente dolor al Geographical information systems officer u observa una mayor cantidad [place].  Tiene dolor durante las [employer]. ASEGRESE DE QUE:  Comprende esas  instrucciones para el alta mdica.  Controlar su enfermedad.  Pedir ayuda de inmediato si no mejora o empeora. Document Released: 10/30/2005 Document Revised: 03/16/2014 [facility] Patient Information 2015 [place], [place]. This information is not intended to replace advice given to you by your health care provider. Make sure you discuss any questions you have with your health care provider.   Emergency Department Resource Guide 1) Find a Doctor and Pay Out of Pocket Although you won't have to find out who is covered by your insurance plan, it is a good idea to ask around and get recommendations. You will then need to call the office and see if the doctor you have chosen will accept you as a new patient and what types of options they offer for patients who are self-pay. Some doctors offer discounts or will set up payment plans for their patients who do not have insurance, but you will need to ask so you aren't surprised when you get to your appointment.  2) Contact Your Local Health Department Not all health departments have doctors that can see patients for sick visits, but many do, so it is worth a call to see if yours does. If you don't know where your local health department is, you can check in your phone book. The CDC also has a tool to help you locate your state's health department, and many state websites also have listings of all of their local health departments.  3) Find a Walk-in Clinic If your illness is not likely to be very severe or complicated, you may want to try a  walk in clinic. These are popping up all over the country in pharmacies, drugstores, and shopping centers. They're usually staffed by nurse practitioners or physician assistants that have been trained to treat common illnesses and complaints. They're usually fairly quick and inexpensive. However, if you have serious medical issues or chronic medical problems, these are probably not your best option.  No Primary Care  Doctor: - Call Health Connect at  [phone] - they can help you locate a primary care doctor that  accepts your insurance, provides certain services, etc. - Physician Referral Service- [phone]  Chronic Pain Problems: Organization         Address  Phone   Notes  [facility]  [phone] Patients need to be referred by their primary care doctor.   Medication Assistance: Organization         Address  Phone   Notes  [facility] Medication [facility] [address] [place], [place] [address] [phone] --Must be a resident of [facility] [place] -- Must have NO insurance coverage whatsoever (no Medicaid/ Medicare, etc.) -- The pt. MUST have a primary care doctor that directs their care regularly and follows them in the community   MedAssist  [phone]   [employer]  [phone]    Agencies that provide inexpensive medical care: Organization         Address  Phone   Notes  [facility]  [phone]   [facility]    [phone]) [phone]   [facility] [address] [place], [place] [address] [phone]   [facility] [address], [place] [phone]   Planned Parenthood    [phone]   [facility]    [phone]   [facility]  [address], [place] Phone:  [phone], Fax:  [phone] Hours of Operation:  9 am - 6 pm, M-F.  Also accepts Medicaid/Medicare and self-pay.  [facility]  [address], [place] Phone: [phone], Fax: [phone]. Hours of Operation:  8:30 am - 5:30 pm, M-F.  Also accepts Medicaid and self-pay.  [facility] [place] [address], [place] Phone: [phone]) [phone]   [facility] [address], [place], [place] [phone], Ext. [phone] Mondays & Thursdays: 7-9 AM.  First 15 patients are seen on a first  come, first serve basis.    Medicaid-accepting [facility] Providers:  Organization         Address  Phone   Notes  [facility] [address], [place] [phone] Also accepts self-pay patients.  [facility] [address], [place]  [phone]   [facility] [address], [place] [phone]) [phone]   [facility] [address] [place] [address], [place] [phone]) [phone]   Renaye RakersVeita Bland [address], [place]   [phone] Only accepts [place] Access [place] patients after they have their name applied to their card.   Self-Pay (no insurance) in [facility]:  Organization         Address  Phone   Notes  Sickle Cell Patients, [facility] [address], [place] [phone]   [facility] [address], [place] [phone]   [facility]  [address], [place] [phone]   [facility]/Dr. Osei-Bonsu  [address], [place] or [address], [place] [phone] Phone number for both [place] and [place] locations is the same.  [facility] [address], [place] [phone]   [facility] [address] [place] or [address] [phone]   [facility] [address] [place] [phone], phone; [phone], fax Sees patients 1st and 3rd Saturday of every month.  Must not qualify for public or private insurance (i.e. Medicaid, Medicare, [place] Health Choice, Veterans' Benefits)  Household income should be no more than 200% of the poverty level The clinic cannot treat you if you are pregnant or think you are pregnant  Sexually transmitted diseases are not treated at the clinic.    Dental Care: Organization          Address  Phone  Notes  [facility] [address] [place], [place] [phone] Accepts children up to age 38 who are enrolled in [place] or [place] Health Choice; pregnant women with a Medicaid card; and children who have applied for Medicaid or [place] Health Choice, but were declined, whose parents can pay a reduced fee at time of service.  [facility]  [address], [place] [phone] Accepts children up to age 43 who are enrolled in [place] or [place] Health Choice; pregnant women with a Medicaid card; and children who have applied for Medicaid or [place] Health Choice, but were declined, whose parents can pay a reduced fee at time of service.  [facility]  [address] [place], [place] [phone] Patients are seen by appointment only. Walk-ins are not accepted. [facility] will see patients 33 years of age and older. Monday - Tuesday (8am-5pm) Most Wednesdays (8:30-5pm) $30 per visit, cash only  [facility] Adult [facility] Access PRO[facility]  [address], [facility] [phone] Patients are seen by appointment only. Walk-ins are not accepted. [facility] will see patients 24 years of age and older. One Wednesday Evening (Monthly: Volunteer Based).  $30 per visit, cash only  [employer]  [phone] for adults; Children under age 58, call Graduate Pediatric Dentistry at [phone]. Children aged 31-14, please call [phone] to request a pediatric application.  Dental services are provided in all areas of dental care including fillings, crowns and bridges, complete and partial dentures, implants, gum treatment, root canals, and extractions. Preventive care is also provided. Treatment is provided to both adults and children. Patients are selected via a lottery and there is often a waiting list.   [facility] [address], [place]  [phone] [email or web address]   [facility] [address] [place], [place] [phone], Ext. [phone] Second and Fourth Thursday of each month, opens at 6:30 AM; Clinic ends at 9 AM.  Patients are seen on a first-come first-served basis, and a limited number are seen during each clinic.   [facility]  [address] [place] [place], [place] [phone]   Eligibility Requirements You must have lived in [place], [place], or [place] counties for at least the last three months.   You cannot be eligible for state or federal sponsored healthcare  insurance, including CIGNA, [place], or [employer].   You generally cannot be eligible for healthcare insurance through your employer.    How to apply: Eligibility screenings are held every Tuesday and Wednesday afternoon from 1:00 pm until 4:00 pm. You do not need an appointment for the interview!  [facility] [address], [place], [place] [phone]   [facility] Health Department  [phone]   [facility] Health Department  [phone]   [facility] Health Department  [phone]    Behavioral Health Resources in the Community: Intensive Outpatient Programs Organization         Address  Phone  Notes  [facility] [address], [place], [place] [phone]   [facility]patient [address], [place], [place] [phone]   [facility] [address] [place], [place]  [phone]   [facility] Mental Health [address],  [place], [place] [phone] or [phone]   Substance Abuse Resources Organization         Address  Phone  Notes  [facility]  [phone]   [facility]  [phone]   [facility] [place]  [phone]   Floydene Flock  [phone]   Residential & Outpatient Substance Abuse Program  [phone]   Psychological  Services Organization         Address  Phone  Notes  [facility] Behavioral Health  [phone]   [facility]  [phone]   [facility] Mental Health [address] [place] [phone] or [phone]    Mobile Crisis Teams Organization         Address  Phone  Notes  [facility], Mobile Crisis Care Unit  [phone]   [facility]  [address] [place], [place] [phone]   Doristine Locks [address] [place] [phone]    Self-Help/Support Groups Organization         Address  Phone             Notes  [facility] [place] - variety of support groups  336- I7437963 Call for more information  Narcotics Anonymous (NA), [facility] [address], [employer] [place]  2 meetings at this location   Statistician         Address  Phone  Notes  [facility] [address],    [place]  [phone]   [facility]  [address] [place] [address], [place], [place] [phone]   [facility] [address] [place] [address] [place], [place] [phone] Admissions: 8am-3pm M-F  [facility] [address]    [place], [place] [phone]   [facility] [address] [place], [place], [place] [phone]   [facility] [address],  [place], [place] [phone]   [facility] - Intensive Outpatient [address], [place], [place] [phone]   [facility] ([facility] [address]  [place], [place] [phone] or [phone]   [facility] ([facility]) [address], [place], [place] [phone] Accepts Medicaid  [facility] [address],  [place] [phone] Substance Abuse/Addiction Treatment   [facility] Organization         Address  Phone  Notes  [facility]  [phone]   Angie Fava, PhD [address] [place], [place]   [phone] or (  336) [phone]   °[facility]   [address] °[place], [place] [phone]) [phone]   °[facility] [address], [place], [place] [phone]) [phone] Insurance/Medicaid/sponsorship through [facility]  °[facility] [address]                                    [place], [place] [phone] Therapy/tele-psych/case  °[facility] [address]  ° [place], [place] [phone]    °Dr. Arfeen  ([phone]   °[facility] [place]  [employer] [place] [facility] 1) [address], [place] °2) [address], [place] °3)  [address], [place] [phone] °([phone]) [phone] ° °([phone]) [phone]   °[place] Child Abuse Hotline (336) 342-1394 or (336) 342-3537 (After Hours)    ° ° ° °

## 2015-01-01 NOTE — ED Notes (Signed)
Delay in blood draw, pt refusing...notified nurse and nurse spoke with pt.

## 2015-01-01 NOTE — ED Notes (Signed)
Patient upset r/t ED wait time Patient informed of reason for increased ED time: higher acuity of other patients, patient volume, waiting for ordered test results to come back Patient did not acknowledge this nurse after providing answer to questions

## 2015-01-01 NOTE — ED Notes (Signed)
Pelvic cart set up in room 

## 2015-01-01 NOTE — ED Notes (Signed)
PA at bedside to speak with patient about test results Per PA, patient to be DC home

## 2015-01-02 NOTE — ED Notes (Signed)
Patient did not want to stay for DC VS or to sign for papers Patient denies c/o pain or additional questions at time of DC home Patient in NAD at time of DC

## 2015-01-03 LAB — RPR: RPR Ser Ql: NONREACTIVE

## 2015-01-04 LAB — GC/CHLAMYDIA PROBE AMP (~~LOC~~) NOT AT ARMC
CHLAMYDIA, DNA PROBE: NEGATIVE
NEISSERIA GONORRHEA: NEGATIVE

## 2015-01-04 LAB — HIV ANTIBODY (ROUTINE TESTING W REFLEX): HIV Screen 4th Generation wRfx: NONREACTIVE

## 2015-01-19 ENCOUNTER — Ambulatory Visit: Payer: Self-pay

## 2015-04-27 ENCOUNTER — Inpatient Hospital Stay (HOSPITAL_COMMUNITY)
Admission: AD | Admit: 2015-04-27 | Discharge: 2015-04-27 | Disposition: A | Discharge status: Home / Self Care | Payer: Self-pay | Source: Ambulatory Visit | Attending: Family Medicine | Admitting: Family Medicine

## 2015-04-27 ENCOUNTER — Encounter (HOSPITAL_COMMUNITY): Payer: Self-pay | Admitting: *Deleted

## 2015-04-27 DIAGNOSIS — N898 Other specified noninflammatory disorders of vagina: Secondary | ICD-10-CM | POA: Insufficient documentation

## 2015-04-27 DIAGNOSIS — F17210 Nicotine dependence, cigarettes, uncomplicated: Secondary | ICD-10-CM | POA: Insufficient documentation

## 2015-04-27 LAB — URINALYSIS, ROUTINE W REFLEX MICROSCOPIC
Bilirubin Urine: NEGATIVE
Glucose, UA: NEGATIVE mg/dL
HGB URINE DIPSTICK: NEGATIVE
Ketones, ur: NEGATIVE mg/dL
Leukocytes, UA: NEGATIVE
Nitrite: NEGATIVE
PROTEIN: NEGATIVE mg/dL
SPECIFIC GRAVITY, URINE: 1.025 (ref 1.005–1.030)
Urobilinogen, UA: 0.2 mg/dL (ref 0.0–1.0)
pH: 7 (ref 5.0–8.0)

## 2015-04-27 LAB — POCT PREGNANCY, URINE: PREG TEST UR: NEGATIVE

## 2015-04-27 NOTE — MAU Provider Note (Signed)
CSN: [id number]     Arrival date & time 04/27/15  1628 History   None    Chief Complaint  Patient presents with  . Vaginal Discharge     (Consider location/radiation/quality/duration/timing/severity/associated sxs/prior Treatment) HPI Belinda Mcintosh. Belinda Mcintosh is a 34 y.o. female who presents to the ED with vaginal d/c that has been off and on since Feb. She was evaluated in the ED at [facility] and treated for yeast infection after being on Keflex for a UTI. She was to follow up with the GYN clinic for pap smear and yearly physical, per review of previous visit. Patient understood that [facility] time for the [facility] smear. She is here today requesting pap smear and testing for HPV.   History reviewed. No pertinent past medical history. Past Surgical History  Procedure Laterality Date  . Induced abortion    . Nose surgery     Family History  Problem Relation Age of Onset  . Alcohol abuse Neg Hx   . Arthritis Neg Hx   . Asthma Neg Hx   . Birth defects Neg Hx   . Cancer Neg Hx   . COPD Neg Hx   . Depression Neg Hx   . Diabetes Neg Hx   . Drug abuse Neg Hx   . Early death Neg Hx   . Heart disease Neg Hx   . Hearing loss Neg Hx   . Hyperlipidemia Neg Hx   . Hypertension Neg Hx   . Kidney disease Neg Hx   . Learning disabilities Neg Hx   . Mental illness Neg Hx   . Mental retardation Neg Hx   . Miscarriages / Stillbirths Neg Hx   . Stroke Neg Hx   . Vision loss Neg Hx   . Varicose Veins Neg Hx    History  Substance Use Topics  . Smoking status: Current Every Day Smoker -- 1.00 packs/day  . Smokeless tobacco: Not on file  . Alcohol Use: No   OB History    Gravida Para Term Preterm AB TAB SAB Ectopic Multiple Living   1    1 1          Review of Systems Negative except as stated in HPI   Allergies  Review of patient's allergies indicates no known allergies.  Home Medications   Prior to Admission medications   Medication Sig Start Date End Date Taking?  Authorizing Provider  cephALEXin (KEFLEX) 500 MG capsule Take 500 mg by mouth 4 (four) times daily.    Historical Provider, MD  ibuprofen (ADVIL,MOTRIN) 200 MG tablet Take 200 mg by mouth every 6 (six) hours as needed for moderate pain.    Historical Provider, MD   BP 116/69 mmHg  Pulse 75  Temp(Src) 98.6 F (37 C) (Oral)  Resp 18  LMP 04/22/2015 Physical Exam  Constitutional: She is oriented to person, place, and time. She appears well-developed and well-nourished.  HENT:  Head: Atraumatic.  Eyes: EOM are normal.  Neck: Neck supple.  Cardiovascular: Normal rate.   Pulmonary/Chest: Effort normal.  Musculoskeletal: Normal range of motion.  Neurological: She is alert and oriented to person, place, and time. No cranial nerve deficit.  Skin: Skin is warm and dry.  Psychiatric: She has a normal mood and affect. Her behavior is normal.  Nursing note and vitals reviewed.   ED Course  Procedures (including critical care time) Labs Review Results for orders placed or performed during the hospital encounter of 04/27/15 (from  the past 24 hour(s))  Urinalysis, Routine w reflex microscopic (not at [facility])     Status: Abnormal   Collection Time: 04/27/15  4:40 PM  Result Value Ref Range   Color, Urine YELLOW YELLOW   APPearance HAZY (A) CLEAR   Specific Gravity, Urine 1.025 1.005 - 1.030   pH 7.0 5.0 - 8.0   Glucose, UA NEGATIVE NEGATIVE mg/dL   Hgb urine dipstick NEGATIVE NEGATIVE   Bilirubin Urine NEGATIVE NEGATIVE   Ketones, ur NEGATIVE NEGATIVE mg/dL   Protein, ur NEGATIVE NEGATIVE mg/dL   Urobilinogen, UA 0.2 0.0 - 1.0 mg/dL   Nitrite NEGATIVE NEGATIVE   Leukocytes, UA NEGATIVE NEGATIVE  Pregnancy, urine POC     Status: None   Collection Time: 04/27/15  4:51 PM  Result Value Ref Range   Preg Test, Ur NEGATIVE NEGATIVE    I discussed with the patient that we could do pelvic exam with wet prep and cultures but she will need to make an appointment with the GYN Clinic for her pap  smear. Patient states that she will make the appointment and have everything done at the same time.   MDM  34 y.o. female requesting pap smear and GYN check up. She will follow up with the GYN Clinic. Stable for d/c without pain, fever or other problems.  Final diagnoses:  Vaginal discharge

## 2015-04-27 NOTE — MAU Note (Signed)
Pt came to MAU and she was suppose to go to the clinic downstairs; given information and phone number for clinic in her discharge papers; no treatment given;

## 2015-04-27 NOTE — MAU Note (Signed)
Heavy d/c noted, has been itching.  Intercourse is painful. Odor noted with d/c. Has had before and was treated, doesn't understand how it is back.  Thinks her partner has cheated on her

## 2015-05-14 ENCOUNTER — Encounter: Payer: Self-pay | Admitting: Obstetrics & Gynecology

## 2015-05-14 ENCOUNTER — Ambulatory Visit (INDEPENDENT_AMBULATORY_CARE_PROVIDER_SITE_OTHER): Payer: No Typology Code available for payment source | Admitting: Obstetrics & Gynecology

## 2015-05-14 ENCOUNTER — Other Ambulatory Visit: Payer: Self-pay | Admitting: Obstetrics & Gynecology

## 2015-05-14 VITALS — BP 119/65 | HR 60 | Temp 97.8°F | Ht 60.0 in | Wt 120.6 lb

## 2015-05-14 DIAGNOSIS — Z118 Encounter for screening for other infectious and parasitic diseases: Secondary | ICD-10-CM

## 2015-05-14 DIAGNOSIS — Z113 Encounter for screening for infections with a predominantly sexual mode of transmission: Secondary | ICD-10-CM | POA: Diagnosis not present

## 2015-05-14 NOTE — Progress Notes (Signed)
[employer] [phone].  Free Pap Screening # given to pt to make an appt. Pt stated understanding.

## 2015-05-14 NOTE — Patient Instructions (Signed)
Vaginosis bacteriana (Bacterial Vaginosis) La vaginosis bacteriana es una infeccin vaginal que perturba el equilibrio normal de las bacterias que se encuentran en la vagina. Es el resultado de un crecimiento excesivo de ciertas bacterias. Esta es la infeccin vaginal ms frecuente en mujeres en edad reproductiva. El tratamiento es importante para prevenir complicaciones, especialmente en mujeres embarazadas, dado que puede causar un parto prematuro. CAUSAS  La vaginosis bacteriana se origina por un aumento de bacterias nocivas que, generalmente, estn presentes en cantidades ms pequeas en la vagina. Varios tipos diferentes de bacterias pueden causar esta afeccin. Sin embargo, la causa de su desarrollo no se comprende totalmente. FACTORES DE RIESGO Ciertas actividades o comportamientos pueden exponerlo a un mayor riesgo de desarrollar vaginosis bacteriana, entre los que se incluyen:  Tener una nueva pareja sexual o mltiples parejas sexuales.  Las duchas vaginales  El uso del DIU (dispositivo intrauterino) como mtodo anticonceptivo. El contagio no se produce en baos, por ropas de cama, en piscinas o por contacto con objetos. SIGNOS Y SNTOMAS  Algunas mujeres que padecen vaginosis bacteriana no presentan signos ni sntomas. Los sntomas ms comunes son:  Secrecin vaginal de color grisceo.  Secrecin vaginal con olor similar al pescado, especialmente despus de mantener relaciones sexuales.  Picazn o sensacin de ardor en la vagina o la vulva.  Ardor o dolor al orinar. DIAGNSTICO  Su mdico analizar su historia clnica y le examinar la vagina para detectar signos de vaginosis bacteriana. Puede tomarle una muestra de flujo vaginal. Su mdico examinar esta muestra con un microscopio para controlar las bacterias y clulas anormales. Tambin puede realizarse un anlisis del pH vaginal.  TRATAMIENTO  La vaginosis bacteriana puede tratarse con antibiticos, en forma de comprimidos o  de crema vaginal. Puede indicarse una segunda tanda de antibiticos si la afeccin se repite despus del tratamiento.  INSTRUCCIONES PARA EL CUIDADO EN EL HOGAR   Tome solo medicamentos de venta libre o recetados, segn las indicaciones del mdico.  Si le han recetado antibiticos, tmelos como se le indic. Asegrese de que finaliza la prescripcin completa aunque se sienta mejor.  No mantenga relaciones sexuales hasta completar el tratamiento.  Comunique a sus compaeros sexuales que sufre una infeccin vaginal. Deben consultar a su mdico y recibir tratamiento si tienen problemas, como picazn o una erupcin cutnea leve.  Practique el sexo seguro usando preservativos y tenga un nico compaero sexual. SOLICITE ATENCIN MDICA SI:   Sus sntomas no mejoran despus de 3 das de tratamiento.  Aumenta la secrecin o el dolor.  Tiene fiebre. ASEGRESE DE QUE:   Comprende estas instrucciones.  Controlar su afeccin.  Recibir ayuda de inmediato si no mejora o si empeora. PARA OBTENER MS INFORMACIN  Centros para el control y la prevencin de enfermedades (Centers for Disease Control and Prevention, CDC): www.cdc.gov/std Asociacin Estadounidense de la Salud Sexual (American Sexual Health Association, SHA): www.ashastd.org  Document Released: 02/06/2008 Document Revised: 08/20/2013 ExitCare Patient Information 2015 ExitCare, [employer]. This information is not intended to replace advice given to you by your health care provider. Make sure you discuss any questions you have with your health care provider.  

## 2015-05-14 NOTE — Progress Notes (Signed)
Patient ID: Belinda Mcintosh, female   DOB: 04/28/1981, 34 y.o.   MRN: [id number] History:  34 y.o. G1P0010 here today for abnormal discharge.  She reports a watery discharge with a slight odor.  She is  Worried about infertility from her partner.  She reports that she was told that she had a UTI and early kidney infxn prev.   The following portions of the patient's history were reviewed and updated as appropriate: allergies, current medications, past family history, past medical history, past social history, past surgical history and problem list.  Review of Systems:  Pertinent items are noted in HPI.  Objective:  Physical Exam Blood pressure 119/65, pulse 60, temperature 97.8 F (36.6 C), temperature source Oral, height 5' (1.524 m), weight 120 lb 9.6 oz (54.704 kg), last menstrual period 04/22/2015. Gen: NAD Abd: Soft, nontender and nondistended Pelvic: Normal appearing external genitalia; normal appearing vaginal mucosa and cervix.  Normal discharge.  Small uterus, no other palpable masses, no uterine or adnexal tenderness  Labs and Imaging No results found.  Assessment & Plan:  F/u cx and wet smear Need into for free PAP clinic No treatment indicated at present F/u prn  Cathie Bonnell L. Harraway-Smith, M.D., Evern CoreFACOG

## 2015-05-14 NOTE — Progress Notes (Deleted)
Patient ID: Belinda Mcintosh, female   DOB: 05/05/1981, [age] y.o.   MRN: [id number] Subjective:     Belinda Mcintosh is a 34 y.o. female here for a routine exam.  Current complaints: ***.  Personal health questionnaire reviewed: {yes/no:9010}.   Gynecologic History Patient's last menstrual period was 04/22/2015. Contraception: {method:5051} Last Pap: ***. Results were: {norm/abn:16337} Last mammogram: ***. Results were: {norm/abn:16337}  Obstetric History OB History  Gravida Para Term Preterm AB SAB TAB Ectopic Multiple Living  1    1  1        # Outcome Date GA Lbr Len/2nd Weight Sex Delivery Anes PTL Lv  1 TAB                {Common ambulatory SmartLinks:19316}  Review of Systems {ros; complete:30496}    Objective:    {exam; complete:18323}    Assessment:    Healthy female exam.    Plan:    {plan:19193}

## 2015-05-15 LAB — WET PREP, GENITAL
Trich, Wet Prep: NONE SEEN
Yeast Wet Prep HPF POC: NONE SEEN

## 2015-05-18 ENCOUNTER — Other Ambulatory Visit: Payer: Self-pay | Admitting: Obstetrics & Gynecology

## 2015-05-18 DIAGNOSIS — B9689 Other specified bacterial agents as the cause of diseases classified elsewhere: Secondary | ICD-10-CM

## 2015-05-18 DIAGNOSIS — N760 Acute vaginitis: Principal | ICD-10-CM

## 2015-05-18 MED ORDER — METRONIDAZOLE 500 MG PO TABS: 500.0000 mg | ORAL_TABLET | Freq: Two times a day (BID) | ORAL | Status: DC

## 2015-05-19 LAB — GC/CHLAMYDIA PROBE AMP
CT Probe RNA: NEGATIVE
GC PROBE AMP APTIMA: NEGATIVE

## 2015-05-21 NOTE — Progress Notes (Signed)
Called patient with [employer] [phone] at both numbers. No answer on either. Left message on both that we are trying to reach you with results nothing urgent but please call us back at the clinics

## 2015-05-26 ENCOUNTER — Encounter: Payer: Self-pay | Admitting: *Deleted

## 2015-05-26 NOTE — Progress Notes (Signed)
Certified letter to patient with results.

## 2015-05-27 ENCOUNTER — Encounter: Payer: Self-pay | Admitting: Obstetrics & Gynecology

## 2015-06-09 ENCOUNTER — Encounter: Payer: No Typology Code available for payment source | Admitting: Obstetrics & Gynecology

## 2015-06-16 ENCOUNTER — Other Ambulatory Visit: Payer: Self-pay | Admitting: General Practice

## 2015-06-16 DIAGNOSIS — B379 Candidiasis, unspecified: Secondary | ICD-10-CM

## 2015-06-16 MED ORDER — MICONAZOLE NITRATE 100 MG VA SUPP: 100.0000 mg | Freq: Every day | VAGINAL | Status: DC

## 2015-06-16 NOTE — Progress Notes (Signed)
Patient came by front office stating she took the medication for BV but she is still having discharge and feels very irritated down there. Patient is newly pregnant. Told patient we will send in a prescription to her pharmacy as it is possible she has a yeast infection. Patient verbalized understanding and told to call back if symptoms do not resolve. Patient had no questions

## 2016-10-10 ENCOUNTER — Ambulatory Visit (INDEPENDENT_AMBULATORY_CARE_PROVIDER_SITE_OTHER): Payer: Self-pay | Admitting: *Deleted

## 2016-10-10 ENCOUNTER — Encounter: Payer: Self-pay | Admitting: Family Medicine

## 2016-10-10 DIAGNOSIS — Z3200 Encounter for pregnancy test, result unknown: Secondary | ICD-10-CM

## 2016-10-10 DIAGNOSIS — Z3201 Encounter for pregnancy test, result positive: Secondary | ICD-10-CM

## 2016-10-10 LAB — POCT PREGNANCY, URINE: Preg Test, Ur: POSITIVE — AB

## 2016-10-10 NOTE — Progress Notes (Signed)
Unsure LMP  08/23/16 - EDD 05/30/17. Medication reconciliation completed.  Pt advised that she may take prenatal vitamins and Tylenol only - no ibuprofen or Naproxen.

## 2016-11-13 DIAGNOSIS — K429 Umbilical hernia without obstruction or gangrene: Secondary | ICD-10-CM

## 2016-11-13 HISTORY — DX: Umbilical hernia without obstruction or gangrene: K42.9

## 2016-11-13 NOTE — L&D Delivery Note (Signed)
Delivery Note At 2:25 AM a viable female was delivered via Vaginal, Spontaneous Delivery (Presentation: OA).  Cord: 2V with the following complications: tight nuchal x1, unable to reduce, delivered through and reduced via somersault maneuver. Poor tone, color, and respiraory effort. Cord clamped and cut and infant taken to warmer. Cord pH: 7.2. APGAR: 4, 8; weight 7 lb 4.9 oz (3315 g). Placenta status: delivered, intact.  Anesthesia: epidural  Episiotomy: None Lacerations: 2nd degree Suture Repair: 2.0 vicryl rapide Est. Blood Loss (mL): 150  Mom to postpartum.  Baby to Couplet care / Skin to Skin.  Donette LarryMelanie Rishik Tubby, CNM 05/21/2017, 2:49 AM

## 2016-11-21 ENCOUNTER — Encounter: Payer: Self-pay | Admitting: Medical

## 2016-11-21 ENCOUNTER — Ambulatory Visit (INDEPENDENT_AMBULATORY_CARE_PROVIDER_SITE_OTHER): Payer: Self-pay | Admitting: Medical

## 2016-11-21 VITALS — BP 92/64 | HR 85 | Wt 135.0 lb

## 2016-11-21 DIAGNOSIS — Z23 Encounter for immunization: Secondary | ICD-10-CM

## 2016-11-21 DIAGNOSIS — Z3481 Encounter for supervision of other normal pregnancy, first trimester: Secondary | ICD-10-CM

## 2016-11-21 DIAGNOSIS — Z3491 Encounter for supervision of normal pregnancy, unspecified, first trimester: Secondary | ICD-10-CM

## 2016-11-21 DIAGNOSIS — O219 Vomiting of pregnancy, unspecified: Secondary | ICD-10-CM

## 2016-11-21 DIAGNOSIS — Z3492 Encounter for supervision of normal pregnancy, unspecified, second trimester: Secondary | ICD-10-CM

## 2016-11-21 DIAGNOSIS — O26891 Other specified pregnancy related conditions, first trimester: Secondary | ICD-10-CM

## 2016-11-21 DIAGNOSIS — Z3403 Encounter for supervision of normal first pregnancy, third trimester: Secondary | ICD-10-CM | POA: Insufficient documentation

## 2016-11-21 DIAGNOSIS — R12 Heartburn: Secondary | ICD-10-CM

## 2016-11-21 DIAGNOSIS — R110 Nausea: Secondary | ICD-10-CM

## 2016-11-21 LAB — POCT URINALYSIS DIP (DEVICE)
Bilirubin Urine: NEGATIVE
Glucose, UA: NEGATIVE mg/dL
Hgb urine dipstick: NEGATIVE
Ketones, ur: NEGATIVE mg/dL
Nitrite: NEGATIVE
PROTEIN: NEGATIVE mg/dL
Specific Gravity, Urine: 1.015 (ref 1.005–1.030)
UROBILINOGEN UA: 0.2 mg/dL (ref 0.0–1.0)
pH: 7 (ref 5.0–8.0)

## 2016-11-21 MED ORDER — PROMETHAZINE HCL 12.5 MG PO TABS: 12.5000 mg | ORAL_TABLET | Freq: Four times a day (QID) | ORAL | 0 refills | Status: DC | PRN

## 2016-11-21 MED ORDER — PANTOPRAZOLE SODIUM 20 MG PO TBEC: 20.0000 mg | DELAYED_RELEASE_TABLET | Freq: Every day | ORAL | 3 refills | Status: DC

## 2016-11-21 NOTE — Patient Instructions (Signed)
Plan de alimentacin para la hiperemesis gravdica (Eating Plan for Hyperemesis Gravidarum) Los casos graves de hiperemesis gravdica pueden derivar en deshidratacin y desnutricin. El plan de alimentacin para la hiperemesis es una forma de disminuir los sntomas de nuseas y vmitos. A menudo se utiliza con medicamentos recetados para controlar los sntomas.  QU PUEDO HACER PARA ALIVIAR MIS SNTOMAS? Prstele atencin a su cuerpo. Todas las personas son diferentes y Radiographer, therapeutictienen diferentes preferencias. Descubra qu funciona mejor para usted. Algunas de las siguientes opciones pueden ser de ayuda:  Coma y beba lentamente.  Realice 5 o 6comidas pequeas por da en vez de tres grandes.  Consuma galletitas saladas antes de levantarse por la maana.  Los alimentos que contienen almidn normalmente se toleran bien, como cereales, tostadas, pan, papas, pasta, arroz y pretzels.  El jengibre tambin es til para Automotive engineerevitar las nuseas. Aada  de cucharadita de jengibre molido al t caliente o beba t de jengibre.  Intente tomar jugo 100% de frutas o una bebida de electrolitos.  Contine tomando las vitaminas prenatales segn las indicaciones del mdico. Si tiene problemas para tomas sus vitaminas prenatales, hable con su mdico sobre las diferentes opciones.  Incluya al menos una porcin de protenas con las comidas y las colaciones (como carne de vaca o de ave, frijoles, frutos secos, Regulatory affairs officerhuevos o yogur). Intente comer una colacin rica en protenas antes de irse a dormir (como queso y Gaffergalletas o medio sndwich de Indian Springsmantequilla de man o pavo). QU DEBO EVITAR PARA REDUCIR MIS SNTOMAS? Las siguientes opciones pueden ser de ayuda para reducir sus sntomas:  Evite los alimentos con olores fuertes. Intente comer en reas bien ventiladas libres de olores.  Evite tomar agua u otras bebidas con las comidas. Intente no beber nada 30minutos antes y despus de las comidas.  Evite beber ms de una taza de  lquidos a la vez.  Evite las comidas fritas o con gran contenido de Highlandgrasas, como salsas con Welchmanteca y crema.  Evite los alimentos muy condimentados.  Evite saltear comidas tanto como sea posible. Las nuseas pueden ser ms intensas con el estmago vaco. Si no puede Performance Food Grouptolerar los alimentos en ese momento, no se obligue. Intente chupar trozos de hielo u otros elementos congelados e ingiera las caloras ms tarde.  Evite recostarse Weyerhaeuser Companydurante las dos horas siguientes a comer. Esta informacin no tiene Theme park managercomo fin reemplazar el consejo del mdico. Asegrese de hacerle al mdico cualquier pregunta que tenga. Document Released: 02/15/2009 Document Revised: 11/04/2013 Elsevier Interactive Patient Education  2017 [employer]

## 2016-11-21 NOTE — Progress Notes (Signed)
   PRENATAL VISIT NOTE  Subjective:  Belinda Mcintosh is a 36 y.o. G2P0010 at 2326w6d being seen today for initial prenatal care visit.  She is currently monitored for the following issues for this low-risk pregnancy and has Supervision of normal pregnancy in first trimester on her problem list.  Patient reports heartburn and nausea.  Contractions: Not present. Vag. Bleeding: None.  Movement: Absent. Denies leaking of fluid.   The following portions of the patient's history were reviewed and updated as appropriate: allergies, current medications, past family history, past medical history, past social history, past surgical history and problem list. Problem list updated.  Objective:   Vitals:   11/21/16 1259  BP: 92/64  Pulse: 85  Weight: 135 lb (61.2 kg)    Fetal Status: Fetal Heart Rate (bpm): 154   Movement: Absent     General:  Alert, oriented and cooperative. Patient is in no acute distress.  Skin: Skin is warm and dry. No rash noted.   Cardiovascular: Normal heart rate noted  Respiratory: Normal respiratory effort, no problems with respiration noted  Abdomen: Soft, gravid, appropriate for gestational age. Pain/Pressure: Present     Pelvic:  Cervical exam deferred        Extremities: Normal range of motion.  Edema: None  Mental Status: Normal mood and affect. Normal behavior. Normal judgment and thought content.   Assessment and Plan:  Pregnancy: G2P0010 at 326w6d  1. Supervision of low-risk pregnancy, first trimester - Cytology - PAP - [facility] records requested - Culture, OB Urine - Pain Mgmt, Profile 6 Conf w/o mM, U - US MFM OB COMP + 14 WK; scheduled - Prenatal Profile   2. Nausea - pantoprazole (PROTONIX) 20 MG tablet; Take 1 tablet (20 mg total) by mouth daily.  Dispense: 30 tablet; Refill: 3 - promethazine (PHENERGAN) 12.5 MG tablet; Take 1 tablet (12.5 mg total) by mouth every 6 (six) hours as needed for nausea or vomiting.  Dispense: 30  tablet; Refill: 0  3. Heartburn in pregnancy in first trimester - pantoprazole (PROTONIX) 20 MG tablet; Take 1 tablet (20 mg total) by mouth daily.  Dispense: 30 tablet; Refill: 3  first trimester warning symptoms and general obstetric precautions including but not limited to vaginal bleeding, contractions, leaking of fluid and fetal movement were reviewed in detail with the patient. Please refer to After Visit Summary for other counseling recommendations.  Return in about 7 weeks (around 01/09/2017) for LOB.   Marny LowensteinJulie N Lashala Laser, PA-C

## 2016-11-21 NOTE — Progress Notes (Signed)
Spanish Designer, multimedianterpreter Marisol  New ob packet given   Unable to schedule First Trimester Screen due to appt slots available.  Anatomy US scheduled for February 21 st @ 1430.  Pt notified.

## 2016-11-22 LAB — PRENATAL PROFILE (SOLSTAS)
Antibody Screen: NEGATIVE
Basophils Absolute: 0 cells/uL (ref 0–200)
Basophils Relative: 0 %
EOS ABS: 0 {cells}/uL — AB (ref 15–500)
EOS PCT: 0 %
HCT: 38 % (ref 35.0–45.0)
HEMOGLOBIN: 12.7 g/dL (ref 11.7–15.5)
HIV 1&2 Ab, 4th Generation: NONREACTIVE
Hepatitis B Surface Ag: NEGATIVE
Lymphocytes Relative: 19 %
Lymphs Abs: 1824 cells/uL (ref 850–3900)
MCH: 30.2 pg (ref 27.0–33.0)
MCHC: 33.4 g/dL (ref 32.0–36.0)
MCV: 90.3 fL (ref 80.0–100.0)
MPV: 12.2 fL (ref 7.5–12.5)
Monocytes Absolute: 480 cells/uL (ref 200–950)
Monocytes Relative: 5 %
NEUTROS PCT: 76 %
Neutro Abs: 7296 cells/uL (ref 1500–7800)
PLATELETS: 158 10*3/uL (ref 140–400)
RBC: 4.21 MIL/uL (ref 3.80–5.10)
RDW: 13.6 % (ref 11.0–15.0)
RUBELLA: 2.26 {index} — AB (ref <0.90)
Rh Type: POSITIVE
WBC: 9.6 10*3/uL (ref 3.8–10.8)

## 2016-11-22 LAB — CULTURE, OB URINE

## 2016-11-22 LAB — PAIN MGMT, PROFILE 6 CONF W/O MM, U
6 Acetylmorphine: NEGATIVE ng/mL (ref <10)
Alcohol Metabolites: NEGATIVE ng/mL (ref <500)
Amphetamines: NEGATIVE ng/mL (ref <500)
BARBITURATES: NEGATIVE ng/mL (ref <300)
Benzodiazepines: NEGATIVE ng/mL (ref <100)
Cocaine Metabolite: NEGATIVE ng/mL (ref <150)
Creatinine: 157.1 mg/dL (ref >=20.0)
Marijuana Metabolite: NEGATIVE ng/mL (ref <20)
Methadone Metabolite: NEGATIVE ng/mL (ref <100)
OXIDANT: NEGATIVE ug/mL (ref <200)
Opiates: NEGATIVE ng/mL (ref <100)
Oxycodone: NEGATIVE ng/mL (ref <100)
PH: 7.06 (ref 4.5–9.0)
Phencyclidine: NEGATIVE ng/mL (ref <25)
Please note:: 0

## 2016-11-22 NOTE — Addendum Note (Signed)
Addended by: Faythe CasaBELLAMY, Miana Politte M on: 11/22/2016 08:47 AM   Modules accepted: Orders

## 2016-12-11 ENCOUNTER — Other Ambulatory Visit: Payer: Self-pay

## 2016-12-11 DIAGNOSIS — Z1371 Encounter for nonprocreative screening for genetic disease carrier status: Secondary | ICD-10-CM

## 2016-12-12 LAB — AFP, QUAD SCREEN
AFP: 36 ng/mL
Age Alone: 1:286 {titer}
CURR GEST AGE: 18.1 wk
Down Syndrome Scr Risk Est: 1:608 {titer}
HCG TOTAL: 36.42 [IU]/mL
INH: 143.7 pg/mL
Interpretation-AFP: NEGATIVE
MOM FOR AFP: 0.78
MoM for INH: 0.8
MoM for hCG: 1.29
OPEN SPINA BIFIDA: NEGATIVE
Osb Risk: <1:27300 {titer}
Tri 18 Scr Risk Est: NEGATIVE
uE3 Mom: 0.7
uE3 Value: 0.89 ng/mL

## 2016-12-29 ENCOUNTER — Encounter (HOSPITAL_COMMUNITY): Payer: Self-pay | Admitting: Medical

## 2017-01-03 ENCOUNTER — Ambulatory Visit (HOSPITAL_COMMUNITY)
Admission: RE | Admit: 2017-01-03 | Discharge: 2017-01-03 | Disposition: A | Discharge status: Home / Self Care | Payer: Self-pay | Source: Ambulatory Visit | Attending: Medical | Admitting: Medical

## 2017-01-03 ENCOUNTER — Other Ambulatory Visit: Payer: Self-pay | Admitting: Medical

## 2017-01-03 ENCOUNTER — Encounter (HOSPITAL_COMMUNITY): Payer: Self-pay

## 2017-01-03 DIAGNOSIS — Z3689 Encounter for other specified antenatal screening: Secondary | ICD-10-CM

## 2017-01-03 DIAGNOSIS — Z3A21 21 weeks gestation of pregnancy: Secondary | ICD-10-CM | POA: Insufficient documentation

## 2017-01-03 DIAGNOSIS — Z3A19 19 weeks gestation of pregnancy: Secondary | ICD-10-CM

## 2017-01-03 DIAGNOSIS — Z363 Encounter for antenatal screening for malformations: Secondary | ICD-10-CM | POA: Insufficient documentation

## 2017-01-03 DIAGNOSIS — Z3492 Encounter for supervision of normal pregnancy, unspecified, second trimester: Secondary | ICD-10-CM

## 2017-01-03 DIAGNOSIS — Z3687 Encounter for antenatal screening for uncertain dates: Secondary | ICD-10-CM | POA: Insufficient documentation

## 2017-01-03 DIAGNOSIS — O09522 Supervision of elderly multigravida, second trimester: Secondary | ICD-10-CM | POA: Insufficient documentation

## 2017-01-08 ENCOUNTER — Ambulatory Visit (INDEPENDENT_AMBULATORY_CARE_PROVIDER_SITE_OTHER): Payer: Self-pay | Admitting: Obstetrics and Gynecology

## 2017-01-08 VITALS — BP 109/51 | HR 88 | Wt 144.9 lb

## 2017-01-08 DIAGNOSIS — Z3482 Encounter for supervision of other normal pregnancy, second trimester: Secondary | ICD-10-CM

## 2017-01-08 DIAGNOSIS — N898 Other specified noninflammatory disorders of vagina: Secondary | ICD-10-CM

## 2017-01-08 DIAGNOSIS — Z3491 Encounter for supervision of normal pregnancy, unspecified, first trimester: Secondary | ICD-10-CM

## 2017-01-08 DIAGNOSIS — Z113 Encounter for screening for infections with a predominantly sexual mode of transmission: Secondary | ICD-10-CM

## 2017-01-08 DIAGNOSIS — Z3481 Encounter for supervision of other normal pregnancy, first trimester: Secondary | ICD-10-CM

## 2017-01-08 NOTE — Progress Notes (Signed)
   PRENATAL VISIT NOTE  Subjective:  Belinda Mcintosh is a 36 y.o. G2P0010 at 303w5d being seen today for ongoing prenatal care.  She is currently monitored for the following issues for this low-risk pregnancy and has Supervision of normal pregnancy in first trimester on her problem list.  Patient reports vaginal irritation.  Contractions: Not present. Vag. Bleeding: None.  Movement: Present. Denies leaking of fluid.   Patient reports white discharge and vaginal itching for a few days. Reports her husband had a lesion that they got check out that they sad was normal.   The following portions of the patient's history were reviewed and updated as appropriate: allergies, current medications, past family history, past medical history, past social history, past surgical history and problem list. Problem list updated.  Objective:   Vitals:   01/08/17 1100  BP: (!) 109/51  Pulse: 88  Weight: 144 lb 14.4 oz (65.7 kg)    Fetal Status: Fetal Heart Rate (bpm): 140   Movement: Present     General:  Alert, oriented and cooperative. Patient is in no acute distress.  Skin: Skin is warm and dry. No rash noted.   Cardiovascular: Normal heart rate noted  Respiratory: Normal respiratory effort, no problems with respiration noted  Abdomen: Soft, gravid, appropriate for gestational age. Pain/Pressure: Absent     Pelvic:  Cervical exam deferred        Extremities: Normal range of motion.  Edema: None  Mental Status: Normal mood and affect. Normal behavior. Normal judgment and thought content.   Assessment and Plan:  Pregnancy: G2P0010 at 643w5d  1. Vaginal itching - Cervicovaginal ancillary only, GC/CT, yeast BV, trich  2. Supervision of low-risk pregnancy, first trimester Routine care uptodate Anatomy US reviewed.  Preterm labor symptoms and general obstetric precautions including but not limited to vaginal bleeding, contractions, leaking of fluid and fetal movement were reviewed in detail with the  patient. Please refer to After Visit Summary for other counseling recommendations.  Return for LOB.   Lorne SkeensNicholas Michael Miche Loughridge, MD

## 2017-01-08 NOTE — Patient Instructions (Signed)
How a Baby Grows During Pregnancy Introduction Pregnancy begins when a female's sperm enters a female's egg (fertilization). This happens in one of the tubes (fallopian tubes) that connect the ovaries to the womb (uterus). The fertilized egg is called an embryo until it reaches 10 weeks. From 10 weeks until birth, it is called a fetus. The fertilized egg moves down the fallopian tube to the uterus. Then it implants into the lining of the uterus and begins to grow. The developing fetus receives oxygen and nutrients through the pregnant woman's bloodstream and the tissues that grow (placenta) to support the fetus. The placenta is the life support system for the fetus. It provides nutrition and removes waste. Learning as much as you can about your pregnancy and how your baby is developing can help you enjoy the experience. It can also make you aware of when there might be a problem and when to ask questions. How long does a typical pregnancy last? A pregnancy usually lasts 280 days, or about 40 weeks. Pregnancy is divided into three trimesters:  First trimester: 0-13 weeks.  Second trimester: 14-27 weeks.  Third trimester: 28-40 weeks. The day when your baby is considered ready to be born (full term) is your estimated date of delivery. How does my baby develop month by month? First month  The fertilized egg attaches to the inside of the uterus.  Some cells will form the placenta. Others will form the fetus.  The arms, legs, brain, spinal cord, lungs, and heart begin to develop.  At the end of the first month, the heart begins to beat. Second month  The bones, inner ear, eyelids, hands, and feet form.  The genitals develop.  By the end of 8 weeks, all major organs are developing. Third month  All of the internal organs are forming.  Teeth develop below the gums.  Bones and muscles begin to grow. The spine can flex.  The skin is transparent.  Fingernails and toenails begin to  form.  Arms and legs continue to grow longer, and hands and feet develop.  The fetus is about 3 in (7.6 cm) long. Fourth month  The placenta is completely formed.  The external sex organs, neck, outer ear, eyebrows, eyelids, and fingernails are formed.  The fetus can hear, swallow, and move its arms and legs.  The kidneys begin to produce urine.  The skin is covered with a white waxy coating (vernix) and very fine hair (lanugo). Fifth month  The fetus moves around more and can be felt for the first time (quickening).  The fetus starts to sleep and wake up and may begin to suck its finger.  The nails grow to the end of the fingers.  The organ in the digestive system that makes bile (gallbladder) functions and helps to digest the nutrients.  If your baby is a girl, eggs are present in her ovaries. If your baby is a boy, testicles start to move down into his scrotum. Sixth month  The lungs are formed, but the fetus is not yet able to breathe.  The eyes open. The brain continues to develop.  Your baby has fingerprints and toe prints. Your baby's hair grows thicker.  At the end of the second trimester, the fetus is about 9 in (22.9 cm) long. Seventh month  The fetus kicks and stretches.  The eyes are developed enough to sense changes in light.  The hands can make a grasping motion.  The fetus responds to sound. Eighth month    All organs and body systems are fully developed and functioning.  Bones harden and taste buds develop. The fetus may hiccup.  Certain areas of the brain are still developing. The skull remains soft. Ninth month  The fetus gains about  lb (0.23 kg) each week.  The lungs are fully developed.  Patterns of sleep develop.  The fetus's head typically moves into a head-down position (vertex) in the uterus to prepare for birth. If the buttocks move into a vertex position instead, the baby is breech.  The fetus weighs 6-9 lbs (2.72-4.08 kg) and is  19-20 in (48.26-50.8 cm) long. What can I do to have a healthy pregnancy and help my baby develop?  Eating and Drinking  Eat a healthy diet.  Talk with your health care provider to make sure that you are getting the nutrients that you and your baby need.  Visit www.choosemyplate.gov to learn about creating a healthy diet.  Gain a healthy amount of weight during pregnancy as advised by your health care provider. This is usually 25-35 pounds. You may need to:  Gain more if you were underweight before getting pregnant or if you are pregnant with more than one baby.  Gain less if you were overweight or obese when you got pregnant. Medicines and Vitamins  Take prenatal vitamins as directed by your health care provider. These include vitamins such as folic acid, iron, calcium, and vitamin D. They are important for healthy development.  Take medicines only as directed by your health care provider. Read labels and ask a pharmacist or your health care provider whether over-the-counter medicines, supplements, and prescription drugs are safe to take during pregnancy. Activities  Be physically active as advised by your health care provider. Ask your health care provider to recommend activities that are safe for you to do, such as walking or swimming.  Do not participate in strenuous or extreme sports.  Lifestyle  Do not drink alcohol.  Do not use any tobacco products, including cigarettes, chewing tobacco, or electronic cigarettes. If you need help quitting, ask your health care provider.  Do not use illegal drugs. Safety  Avoid exposure to mercury, lead, or other heavy metals. Ask your health care provider about common sources of these heavy metals.  Avoid listeria infection during pregnancy. Follow these precautions:  Do not eat soft cheeses or deli meats.  Do not eat hot dogs unless they have been warmed up to the point of steaming, such as in the microwave oven.  Do not drink  unpasteurized milk.  Avoid toxoplasmosis infection during pregnancy. Follow these precautions:  Do not change your cat's litter box, if you have a cat. Ask someone else to do this for you.  Wear gardening gloves while working in the yard. General Instructions  Keep all follow-up visits as directed by your health care provider. This is important. This includes prenatal care and screening tests.  Manage any chronic health conditions. Work closely with your health care provider to keep conditions, such as diabetes, under control. How do I know if my baby is developing well? At each prenatal visit, your health care provider will do several different tests to check on your health and keep track of your baby's development. These include:  Fundal height.  Your health care provider will measure your growing belly from top to bottom using a tape measure.  Your health care provider will also feel your belly to determine your baby's position.  Heartbeat.  An ultrasound in the first trimester   can confirm pregnancy and show a heartbeat, depending on how far along you are.  Your health care provider will check your baby's heart rate at every prenatal visit.  As you get closer to your delivery date, you may have regular fetal heart rate monitoring to make sure that your baby is not in distress.  Second trimester ultrasound.  This ultrasound checks your baby's development. It also indicates your baby's gender. What should I do if I have concerns about my baby's development? Always talk with your health care provider about any concerns that you may have. This information is not intended to replace advice given to you by your health care provider. Make sure you discuss any questions you have with your health care provider. Document Released: 04/17/2008 Document Revised: 04/06/2016 Document Reviewed: 04/08/2014  2017 Elsevier  

## 2017-01-08 NOTE — Progress Notes (Signed)
C/o itchy vaginal discharge,

## 2017-01-09 LAB — CERVICOVAGINAL ANCILLARY ONLY
BACTERIAL VAGINITIS: NEGATIVE
Candida vaginitis: POSITIVE — AB
Chlamydia: NEGATIVE
Neisseria Gonorrhea: NEGATIVE
TRICH (WINDOWPATH): NEGATIVE

## 2017-01-10 ENCOUNTER — Telehealth: Payer: Self-pay | Admitting: Obstetrics and Gynecology

## 2017-01-10 MED ORDER — MICONAZOLE NITRATE 4 % VA CREA: 1.0000 | TOPICAL_CREAM | Freq: Every day | VAGINAL | 0 refills | Status: DC

## 2017-01-10 NOTE — Telephone Encounter (Signed)
Patient with candida on swab. Will treat with miconazole.

## 2017-02-07 ENCOUNTER — Encounter: Payer: Self-pay | Admitting: Family Medicine

## 2017-02-07 ENCOUNTER — Ambulatory Visit (INDEPENDENT_AMBULATORY_CARE_PROVIDER_SITE_OTHER): Payer: Self-pay | Admitting: Advanced Practice Midwife

## 2017-02-07 ENCOUNTER — Encounter: Payer: Self-pay | Admitting: Advanced Practice Midwife

## 2017-02-07 VITALS — BP 114/55 | HR 73 | Wt 152.3 lb

## 2017-02-07 DIAGNOSIS — Z3481 Encounter for supervision of other normal pregnancy, first trimester: Secondary | ICD-10-CM

## 2017-02-07 DIAGNOSIS — K429 Umbilical hernia without obstruction or gangrene: Secondary | ICD-10-CM | POA: Insufficient documentation

## 2017-02-07 NOTE — Progress Notes (Signed)
   PRENATAL VISIT NOTE  Subjective:  Belinda Mcintosh is a 36 y.o. G2P0010 at 3748w2d being seen today for ongoing prenatal care.  She is currently monitored for the following issues for this low-risk pregnancy and has Supervision of normal pregnancy in first trimester and Umbilical hernia on her problem list.  Patient reports bulging on abdomen, painful at times.  Contractions: Not present. Vag. Bleeding: None.  Movement: Present. Denies leaking of fluid.   The following portions of the patient's history were reviewed and updated as appropriate: allergies, current medications, past family history, past medical history, past social history, past surgical history and problem list. Problem list updated.  Objective:   Vitals:   02/07/17 1409  BP: (!) 114/55  Pulse: 73  Weight: 152 lb 4.8 oz (69.1 kg)    Fetal Status: Fetal Heart Rate (bpm): 137   Movement: Present     General:  Alert, oriented and cooperative. Patient is in no acute distress.  Skin: Skin is warm and dry. No rash noted.   Cardiovascular: Normal heart rate noted  Respiratory: Normal respiratory effort, no problems with respiration noted  Abdomen: Soft, gravid, appropriate for gestational age. Pain/Pressure: Present     There is an umbilical hernia present, it extends laterally to right. About 4cm long, 1-2cm wide.  NO incarceration.  Very slight bulge.  Pelvic:  Cervical exam deferred        Extremities: Normal range of motion.  Edema: None  Mental Status: Normal mood and affect. Normal behavior. Normal judgment and thought content.   Assessment and Plan:  Pregnancy: G2P0010 at 4148w2d  1. Encounter for supervision of other normal pregnancy in first trimester   2. Umbilical hernia without obstruction and without gangrene      Discussed hernia.  Recommend referral to surgeon, declines due to no insurance. Discussed limiting straining.  Stool softeners if constipated.  Discussed incarceration is a surgical emergency, go to ED  right away.  Discussed they may suggest repair after delivery.   Preterm labor symptoms and general obstetric precautions including but not limited to vaginal bleeding, contractions, leaking of fluid and fetal movement were reviewed in detail with the patient. Needs glucola 2 weeks Please refer to After Visit Summary for other counseling recommendations.  Return in about 2 weeks (around 02/21/2017) for For visit and glucola.   Aviva SignsMarie L Drema Eddington, CNM

## 2017-02-07 NOTE — Patient Instructions (Signed)
Glucose Tolerance Test The glucose tolerance test (GTT) is one of several tests used to diagnose diabetes mellitus. The GTT is a blood test, and it may include a urine test as well. The GTT checks to see how your body processes sugar (glucose). For this test, you will consume a drink containing a high level of glucose. Your blood glucose levels will be checked before you consume the drink and then again 1, 2, 3, and possibly 4 hours after you consume it. Your health care provider may recommend that you have the GTT if you:  Have a family history of diabetes.  Are very overweight (obese).  Have experienced infections that keep coming back.  Have had numerous cuts or wounds that did not heal quickly, especially on your legs and feet.  Are a woman and have a history of giving birth to very large babies or a history of repeated fetal loss (stillbirth).  Have had glucose in your urine or high blood sugar: ? During pregnancy. ? After a heart attack, surgery, or prolonged periods of high stress.  The GTT lasts 3-4 hours. Other than the glucose solution, you will not be allowed to eat or drink anything during the test. You must remain at the testing location to make sure that your blood and urine samples are taken on time. How do I prepare for this test? Eat normally for 3 days prior to the GTT test, including having plenty of carbohydrate-rich foods. Do not eat or drink anything except water during the final 12 hours before the test. You should not smoke or exercise during the test. In addition, your health care provider may ask you to stop taking certain medicines before the test. What do the results mean? It is your responsibility to obtain your test results. Ask the lab or department performing the test when and how you will get your results. Contact your health care provider to discuss any questions you have about your results. Range of Normal Values Ranges for normal values may vary among  different labs and hospitals. You should always check with your health care provider after having lab work or other tests done to discuss whether your values are considered within normal limits. Normal levels of blood glucose are as follows:  Fasting: less than 110 mg/dL or less than 6.1 mmol/L (SI units).  1 hour after consuming the glucose drink: less than 200 mg/dL or less than 11.1 mmol/L.  2 hours after consuming the glucose drink: less than 140 mg/dL or less than 7.8 mmol/L.  3 hours after consuming the glucose drink: 70-115 mg/dL or less than 6.4 mmol/L.  4 hours after consuming the glucose drink: 70-115 mg/dL or less than 6.4 mmol/L.  The normal result for the urine test is negative, meaning that glucose is absent from your urine. Some substances can interfere with GTT results. These may include:  Blood pressure and heart failure medicines, including beta blockers, furosemide, and thiazides.  Anti-inflammatory medicines, including aspirin.  Nicotine.  Some psychiatric medicines.  Oral contraceptives.  Diuretics or corticosteroids.  Meaning of Results Outside Normal Value Ranges GTT test results that are above normal values may indicate health problems, such as:  Diabetes mellitus.  Acute stress response.  Cushing syndrome.  Tumors such as pheochromocytoma or glucagonoma.  Chronic renal failure.  Pancreatitis.  Hyperthyroidism.  Current infection.  Discuss your test results with your health care provider. He or she will use the results to make a diagnosis and determine a treatment plan   that is right for you. Talk with your health care provider to discuss your results, treatment options, and if necessary, the need for more tests. Talk with your health care provider if you have any questions about your results. This information is not intended to replace advice given to you by your health care provider. Make sure you discuss any questions you have with your  health care provider. Document Released: 11/22/2004 Document Revised: 07/04/2016 Document Reviewed: 03/06/2014 Elsevier Interactive Patient Education  2017 Elsevier Inc.  

## 2017-03-06 ENCOUNTER — Ambulatory Visit (INDEPENDENT_AMBULATORY_CARE_PROVIDER_SITE_OTHER): Payer: Self-pay | Admitting: Student

## 2017-03-06 ENCOUNTER — Encounter: Payer: Self-pay | Admitting: Student

## 2017-03-06 VITALS — BP 106/59 | HR 84 | Wt 155.0 lb

## 2017-03-06 DIAGNOSIS — O26893 Other specified pregnancy related conditions, third trimester: Secondary | ICD-10-CM

## 2017-03-06 DIAGNOSIS — Z3493 Encounter for supervision of normal pregnancy, unspecified, third trimester: Secondary | ICD-10-CM

## 2017-03-06 DIAGNOSIS — K429 Umbilical hernia without obstruction or gangrene: Secondary | ICD-10-CM

## 2017-03-06 DIAGNOSIS — Z3483 Encounter for supervision of other normal pregnancy, third trimester: Secondary | ICD-10-CM

## 2017-03-06 DIAGNOSIS — N898 Other specified noninflammatory disorders of vagina: Secondary | ICD-10-CM

## 2017-03-06 DIAGNOSIS — O2243 Hemorrhoids in pregnancy, third trimester: Secondary | ICD-10-CM

## 2017-03-06 DIAGNOSIS — Z113 Encounter for screening for infections with a predominantly sexual mode of transmission: Secondary | ICD-10-CM

## 2017-03-06 MED ORDER — DOCUSATE SODIUM 100 MG PO CAPS: 100.0000 mg | ORAL_CAPSULE | Freq: Two times a day (BID) | ORAL | 0 refills | Status: DC

## 2017-03-06 NOTE — Progress Notes (Signed)
   PRENATAL VISIT NOTE  Subjective:  Belinda Mcintosh. Belinda Mcintosh is a 35 y.o. G2P0010 at [redacted]w[redacted]d being seen today for ongoing prenatal care.  She is currently monitored for the following issues for this low-risk pregnancy and has Encounter for supervision of normal first pregnancy in third trimester and Umbilical hernia on her problem list.  Patient reports hemorrhoids, vaginal irritation, & abdominal pain r/t hernia.   Noted hemorrhoids in the past month. Causes pain during bowel movement. No pain or itching when not having BM. No rectal bleeding. Denies constipation.  Reports thick white vaginal discharge with vaginal irritation since last week. States she had yeast infection last month & used tx but symptoms returned.  Patient reports continued intermittent abdominal pain associated with her umbilical hernia. Pain is worse at night and after lifting things. Has been taking tylenol with mild relief. During previous visit, declined referral to general surgery but is now requesting referral.    Contractions: Not present. Vag. Bleeding: None.  Movement: Present. Denies leaking of fluid.   The following portions of the patient's history were reviewed and updated as appropriate: allergies, current medications, past family history, past medical history, past social history, past surgical history and problem list. Problem list updated.  Objective:   Vitals:   03/06/17 0822  BP: (!) 106/59  Pulse: 84  Weight: 155 lb (70.3 kg)    Fetal Status: Fetal Heart Rate (bpm): 140 Fundal Height: 29 cm Movement: Present     General:  Alert, oriented and cooperative. Patient is in no acute distress.  Skin: Skin is warm and dry. No rash noted.   Cardiovascular: Normal heart rate noted  Respiratory: Normal respiratory effort, no problems with respiration noted  Abdomen: Soft, gravid, appropriate for gestational age. Pain/Pressure: Present   Reducible hernia.    Pelvic:  Cervical exam deferred        Extremities: Normal  range of motion.  Edema: None  Mental Status: Normal mood and affect. Normal behavior. Normal judgment and thought content.   Assessment and Plan:  Pregnancy: G2P0010 at [redacted]w[redacted]d  1. Encounter for supervision of normal pregnancy in third trimester, unspecified gravidity  - Glucose Tolerance, 2 Hours w/1 Hour - CBC - RPR - HIV antibody (with reflex)  2. Vaginal discharge during pregnancy in third trimester  - Cervicovaginal ancillary only  3. Umbilical hernia without obstruction and without gangrene  - Ambulatory referral to General Surgery -- per patient request  4. Hemorrhoids during pregnancy in third trimester -Discussed use of preparation H and/or tucks pads with BM. Increase water/fiber & take stool softeners - docusate sodium (COLACE) 100 MG capsule; Take 1 capsule (100 mg total) by mouth 2 (two) times daily.  Dispense: 60 capsule; Refill: 0  Preterm labor symptoms and general obstetric precautions including but not limited to vaginal bleeding, contractions, leaking of fluid and fetal movement were reviewed in detail with the patient. Please refer to After Visit Summary for other counseling recommendations.  Return in about 2 weeks (around 03/20/2017) for Routine OB.   Judeth Horn, NP

## 2017-03-06 NOTE — Progress Notes (Signed)
Pt states that she has vaginal dryness.

## 2017-03-06 NOTE — Patient Instructions (Signed)
Hernia - Adultos (Hernia, Adult) Una hernia ocurre cuando un rgano o un tejido interno se protruye a travs de un punto debilitado del vientre (abdomen). CUIDADOS EN EL HOGAR  No estire ni use en exceso (sobrecargue) los msculos que estn cerca de la hernia.  No levante ningn objeto que pese ms de 10libras (4,5kg).  Para levantar objetos, use los msculos de las piernas. No use los msculos de la espalda.  Cuando tosa, hgalo con suavidad.  Consuma una dieta con alto contenido de [place]. Coma gran cantidad de frutas y verduras.  Beba suficiente lquido para mantener el pis (orina) claro o de color amarillo plido. Trate de beber 6 u 8vasos de Warehouse manager.  Tome medicamentos para ablandar la materia fecal (ablandadores de heces) como se lo haya indicado el mdico.  Baje de [place], si tiene sobrepeso.  No consuma ningn producto que contenga tabaco, lo que incluye cigarrillos, tabaco de Theatre manager o Administrator, Civil Service. Si necesita ayuda para dejar de fumar, consulte al mdico.  Concurra a todas las visitas de control como se lo haya indicado el mdico. Esto es importante. SOLICITE AYUDA SI:  La piel que rodea la hernia se inflama (hincha) o se enrojece.  Le duele la hernia. SOLICITE AYUDA DE INMEDIATO SI:  Tiene fiebre.  Siente dolor abdominal que empeora.  Tiene malestar estomacal (nuseas) o vomita.  No puede volver a Electrical engineer hernia en su lugar al ejercer sobre esta una presin suave mientras est acostado.  La hernia:  Cambia de forma o de tamao.  Se le atasca fuera del vientre.  Cambia de color.  Est dura al tacto o le causa dolor a la palpacin. Esta informacin no tiene Theme park manager el consejo del mdico. Asegrese de hacerle al mdico cualquier pregunta que tenga. Document Released: 08/20/2013 Document Revised: 11/20/2014 Document Reviewed: 09/09/2014 Elsevier Interactive Patient Education  2017 Elsevier Inc. Hemorroides (Hemorrhoids) Las  hemorroides son venas inflamadas adentro o alrededor del recto o del ano. Las hemorroides pueden causar dolor, picazn o hemorragias. Generalmente no causan problemas graves. Con frecuencia mejoran al [employer] dieta, el estilo de vida y otros tratamientos Facilities manager. CUIDADOS EN EL HOGAR Comida y bebida  Consuma alimentos que contengan fibra, como cereales integrales, porotos, frutos secos, frutas y verduras. Pregntele a su mdico acerca de tomar productos con fibra aadida en ellos (complementos defibra).  Beba suficiente lquido para mantener el pis (orina) claro o de color amarillo plido. En caso de dolor e hinchazn  Tome un bao de agua tibia (bao de asiento) durante 20 minutos para Engineer, materials. Hgalo 3 o 4veces al da.  Si se lo indican, aplique hielo sobre la zona adolorida. Puede ser beneficioso aplicar hielo [employer] baos con agua tibia.  Ponga el hielo en una bolsa plstica.  Coloque una toalla entre la piel y la bolsa de hielo.  Coloque el hielo durante 20 minutos, 2 a 3 veces por da. Instrucciones generales  [employer] de venta libre y los recetados solamente como se lo haya indicado el mdico.  Las cremas y medicamentos recetados que se colocan en el ano (supositorios) se pueden usar o Sales executive se lo hayan indicado.  Haga ejercicio fsico con frecuencia.  Vaya al bao cuando sienta la necesidad de defecar. No espere.  Evite hacer demasiada fuerza al defecar.  Mantenga la zona del ano limpia y [place]. Use papel higinico hmedo o toallas de papel hmedas.  No pase mucho tiempo sentado en  el inodoro. SOLICITE AYUDA SI:  Tiene alguno de estos sntomas:  Dolor e hinchazn que no mejoran con el tratamiento o los medicamentos.  Hemorragia que no se detiene.  Dificultad para defecar o imposibilidad de hacerlo.  Dolor o hinchazn en la zona exterior de las hemorroides. Esta informacin no tiene Theme park manager el consejo del mdico.  Asegrese de hacerle al mdico cualquier pregunta que tenga. Document Released: 02/24/2013 Document Revised: 02/21/2016 Document Reviewed: 07/14/2015 Elsevier Interactive Patient Education  2017 [employer].

## 2017-03-07 ENCOUNTER — Encounter: Payer: Self-pay | Admitting: Student

## 2017-03-07 DIAGNOSIS — O99119 Other diseases of the blood and blood-forming organs and certain disorders involving the immune mechanism complicating pregnancy, unspecified trimester: Secondary | ICD-10-CM

## 2017-03-07 DIAGNOSIS — D696 Thrombocytopenia, unspecified: Secondary | ICD-10-CM | POA: Insufficient documentation

## 2017-03-07 LAB — GLUCOSE TOLERANCE, 2 HOURS W/ 1HR
Glucose, 1 hour: 147 mg/dL (ref 65–179)
Glucose, 2 hour: 121 mg/dL (ref 65–152)
Glucose, Fasting: 80 mg/dL (ref 65–91)

## 2017-03-07 LAB — CBC
HEMATOCRIT: 34.6 % (ref 34.0–46.6)
Hemoglobin: 11.3 g/dL (ref 11.1–15.9)
MCH: 29.7 pg (ref 26.6–33.0)
MCHC: 32.7 g/dL (ref 31.5–35.7)
MCV: 91 fL (ref 79–97)
Platelets: 132 10*3/uL — ABNORMAL LOW (ref 150–379)
RBC: 3.81 x10E6/uL (ref 3.77–5.28)
RDW: 14.5 % (ref 12.3–15.4)
WBC: 9.4 10*3/uL (ref 3.4–10.8)

## 2017-03-07 LAB — CERVICOVAGINAL ANCILLARY ONLY
Bacterial vaginitis: NEGATIVE
Candida vaginitis: NEGATIVE
TRICH (WINDOWPATH): NEGATIVE

## 2017-03-07 LAB — RPR: RPR Ser Ql: NONREACTIVE

## 2017-03-07 LAB — HIV ANTIBODY (ROUTINE TESTING W REFLEX): HIV SCREEN 4TH GENERATION: NONREACTIVE

## 2017-03-12 ENCOUNTER — Telehealth: Payer: Self-pay | Admitting: *Deleted

## 2017-03-12 NOTE — Telephone Encounter (Signed)
Per message from Judeth Horn, NP called patient and notified passed GDM test, no yeast or infection per swab. Platelets a little low- will discuss at next visit. Patient voices understanding.

## 2017-03-26 ENCOUNTER — Ambulatory Visit (INDEPENDENT_AMBULATORY_CARE_PROVIDER_SITE_OTHER): Payer: Self-pay | Admitting: Medical

## 2017-03-26 VITALS — BP 122/64 | HR 85 | Wt 157.0 lb

## 2017-03-26 DIAGNOSIS — O2243 Hemorrhoids in pregnancy, third trimester: Secondary | ICD-10-CM

## 2017-03-26 DIAGNOSIS — Z23 Encounter for immunization: Secondary | ICD-10-CM

## 2017-03-26 DIAGNOSIS — Z3403 Encounter for supervision of normal first pregnancy, third trimester: Secondary | ICD-10-CM

## 2017-03-26 MED ORDER — PHENYLEPH-SHARK LIV OIL-MO-PET 0.25-3-14-71.9 % RE OINT: 1.0000 "application " | TOPICAL_OINTMENT | Freq: Two times a day (BID) | RECTAL | 0 refills | Status: DC | PRN

## 2017-03-26 NOTE — Progress Notes (Signed)
   PRENATAL VISIT NOTE  Subjective:  Belinda Mcintosh is a 36 y.o. G2P0010 at 819w0d being seen today for ongoing prenatal care.  She is currently monitored for the following issues for this low-risk pregnancy and has Encounter for supervision of normal first pregnancy in third trimester; Umbilical hernia; and Thrombocytopenia during pregnancy (HCC) on her problem list.  Patient reports fatigue and perpipheral edema. Patient states mild peripheral edema. She states that this improves with rest. She is normotensive today and denies headache, visual changes or RUQ abdominal pain. Contractions: Not present. Vag. Bleeding: None.  Movement: Present. Denies leaking of fluid.   The following portions of the patient's history were reviewed and updated as appropriate: allergies, current medications, past family history, past medical history, past social history, past surgical history and problem list. Problem list updated.  Objective:   Vitals:   03/26/17 1414  BP: 122/64  Pulse: 85  Weight: 157 lb (71.2 kg)    Fetal Status: Fetal Heart Rate (bpm): 130 Fundal Height: 32 cm Movement: Present     General:  Alert, oriented and cooperative. Patient is in no acute distress.  Skin: Skin is warm and dry. No rash noted.   Cardiovascular: Normal heart rate noted  Respiratory: Normal respiratory effort, no problems with respiration noted  Abdomen: Soft, gravid, appropriate for gestational age. Pain/Pressure: Absent     Pelvic:  Cervical exam deferred        Extremities: Normal range of motion.  Edema: Trace  Mental Status: Normal mood and affect. Normal behavior. Normal judgment and thought content.   Assessment and Plan:  Pregnancy: G2P0010 at 4219w0d  1. Encounter for supervision of normal first pregnancy in third trimester - Tdap vaccine greater than or equal to 7yo IM - Advised to increase PO hydration and elevate feet to reduce swelling  2. Hemorrhoids during pregnancy, antepartum, third  trimester - phenylephrine-shark liver oil-mineral oil-petrolatum (PREPARATION H) 0.25-3-14-71.9 % rectal ointment; Place 1 application rectally 2 (two) times daily as needed for hemorrhoids.  Dispense: 30 g; Refill: 0  3. Thrombocytopenia - Patient aware, she believes she has had this issue in the past - Will recheck CBC at 36 weeks  Preterm labor symptoms and general obstetric precautions including but not limited to vaginal bleeding, contractions, leaking of fluid and fetal movement were reviewed in detail with the patient. Please refer to After Visit Summary for other counseling recommendations.  Return in about 2 weeks (around 04/09/2017) for LOB with MD.   Marny LowensteinWenzel, Liel Rudden N, PA-C

## 2017-03-26 NOTE — Progress Notes (Signed)
Per chart review patient initially enrolled as [facility] optimization patient; but she never enrolled and was removed from optimization.

## 2017-03-26 NOTE — Progress Notes (Signed)
Needs hemorrhoids medications.

## 2017-03-26 NOTE — Patient Instructions (Signed)
Fetal Movement Counts Patient Name: ________________________________________________ Patient Due Date: ____________________ What is a fetal movement count? A fetal movement count is the number of times that you feel your baby move during a certain amount of time. This may also be called a fetal kick count. A fetal movement count is recommended for every pregnant woman. You may be asked to start counting fetal movements as early as week 28 of your pregnancy. Pay attention to when your baby is most active. You may notice your baby's sleep and wake cycles. You may also notice things that make your baby move more. You should do a fetal movement count:  When your baby is normally most active.  At the same time each day. A good time to count movements is while you are resting, after having something to eat and drink. How do I count fetal movements? 1. Find a quiet, comfortable area. Sit, or lie down on your side. 2. Write down the date, the start time and stop time, and the number of movements that you felt between those two times. Take this information with you to your health care visits. 3. For 2 hours, count kicks, flutters, swishes, rolls, and jabs. You should feel at least 10 movements during 2 hours. 4. You may stop counting after you have felt 10 movements. 5. If you do not feel 10 movements in 2 hours, have something to eat and drink. Then, keep resting and counting for 1 hour. If you feel at least 4 movements during that hour, you may stop counting. Contact a health care provider if:  You feel fewer than 4 movements in 2 hours.  Your baby is not moving like he or she usually does. Date: ____________ Start time: ____________ Stop time: ____________ Movements: ____________ Date: ____________ Start time: ____________ Stop time: ____________ Movements: ____________ Date: ____________ Start time: ____________ Stop time: ____________ Movements: ____________ Date: ____________ Start time:  ____________ Stop time: ____________ Movements: ____________ Date: ____________ Start time: ____________ Stop time: ____________ Movements: ____________ Date: ____________ Start time: ____________ Stop time: ____________ Movements: ____________ Date: ____________ Start time: ____________ Stop time: ____________ Movements: ____________ Date: ____________ Start time: ____________ Stop time: ____________ Movements: ____________ Date: ____________ Start time: ____________ Stop time: ____________ Movements: ____________ This information is not intended to replace advice given to you by your health care provider. Make sure you discuss any questions you have with your health care provider. Document Released: 11/29/2006 Document Revised: 06/28/2016 Document Reviewed: 12/09/2015 Elsevier Interactive Patient Education  2017 [employer] Braxton Hicks Contractions Contractions of the uterus can occur throughout pregnancy, but they are not always a sign that you are in labor. You may have practice contractions called Braxton Hicks contractions. These false labor contractions are sometimes confused with true labor. What are Braxton Hicks contractions? Braxton Hicks contractions are tightening movements that occur in the muscles of the uterus before labor. Unlike true labor contractions, these contractions do not result in opening (dilation) and thinning of the cervix. Toward the end of pregnancy (32-34 weeks), Braxton Hicks contractions can happen more often and may become stronger. These contractions are sometimes difficult to tell apart from true labor because they can be very uncomfortable. You should not feel embarrassed if you go to the hospital with false labor. Sometimes, the only way to tell if you are in true labor is for your health care provider to look for changes in the cervix. The health care provider will do a physical exam and may monitor your contractions. If you   are not in true labor, the exam  should show that your cervix is not dilating and your water has not broken. If there are no prenatal problems or other health problems associated with your pregnancy, it is completely safe for you to be sent home with false labor. You may continue to have Braxton Hicks contractions until you go into true labor. How can I tell the difference between true labor and false labor?  Differences  False labor  Contractions last 30-70 seconds.: Contractions are usually shorter and not as strong as true labor contractions.  Contractions become very regular.: Contractions are usually irregular.  Discomfort is usually felt in the top of the uterus, and it spreads to the lower abdomen and low back.: Contractions are often felt in the front of the lower abdomen and in the groin.  Contractions do not go away with walking.: Contractions may go away when you walk around or change positions while lying down.  Contractions usually become more intense and increase in frequency.: Contractions get weaker and are shorter-lasting as time goes on.  The cervix dilates and gets thinner.: The cervix usually does not dilate or become thin. Follow these instructions at home:  Take over-the-counter and prescription medicines only as told by your health care provider.  Keep up with your usual exercises and follow other instructions from your health care provider.  Eat and drink lightly if you think you are going into labor.  If Braxton Hicks contractions are making you uncomfortable:  Change your position from lying down or resting to walking, or change from walking to resting.  Sit and rest in a tub of warm water.  Drink enough fluid to keep your urine clear or pale yellow. Dehydration may cause these contractions.  Do slow and deep breathing several times an hour.  Keep all follow-up prenatal visits as told by your health care provider. This is important. Contact a health care provider if:  You have a  fever.  You have continuous pain in your abdomen. Get help right away if:  Your contractions become stronger, more regular, and closer together.  You have fluid leaking or gushing from your vagina.  You pass blood-tinged mucus (bloody show).  You have bleeding from your vagina.  You have low back pain that you never had before.  You feel your baby's head pushing down and causing pelvic pressure.  Your baby is not moving inside you as much as it used to. Summary  Contractions that occur before labor are called Braxton Hicks contractions, false labor, or practice contractions.  Braxton Hicks contractions are usually shorter, weaker, farther apart, and less regular than true labor contractions. True labor contractions usually become progressively stronger and regular and they become more frequent.  Manage discomfort from Braxton Hicks contractions by changing position, resting in a warm bath, drinking plenty of water, or practicing deep breathing. This information is not intended to replace advice given to you by your health care provider. Make sure you discuss any questions you have with your health care provider. Document Released: 10/30/2005 Document Revised: 09/18/2016 Document Reviewed: 09/18/2016 Elsevier Interactive Patient Education  2017 [employer]  

## 2017-04-10 ENCOUNTER — Ambulatory Visit (INDEPENDENT_AMBULATORY_CARE_PROVIDER_SITE_OTHER): Payer: Self-pay | Admitting: Obstetrics and Gynecology

## 2017-04-10 ENCOUNTER — Encounter: Payer: Self-pay | Admitting: Family Medicine

## 2017-04-10 ENCOUNTER — Encounter: Payer: Self-pay | Admitting: Obstetrics and Gynecology

## 2017-04-10 VITALS — BP 112/72 | HR 84 | Wt 159.5 lb

## 2017-04-10 DIAGNOSIS — Z3403 Encounter for supervision of normal first pregnancy, third trimester: Secondary | ICD-10-CM

## 2017-04-10 DIAGNOSIS — D696 Thrombocytopenia, unspecified: Secondary | ICD-10-CM

## 2017-04-10 DIAGNOSIS — O99113 Other diseases of the blood and blood-forming organs and certain disorders involving the immune mechanism complicating pregnancy, third trimester: Secondary | ICD-10-CM

## 2017-04-10 DIAGNOSIS — O99119 Other diseases of the blood and blood-forming organs and certain disorders involving the immune mechanism complicating pregnancy, unspecified trimester: Secondary | ICD-10-CM

## 2017-04-10 NOTE — Progress Notes (Signed)
   PRENATAL VISIT NOTE  Subjective:  Ammie DaltonEva V. Allene Dillonorres is a 36 y.o. G2P0010 at 2832w1d being seen today for ongoing prenatal care.  She is currently monitored for the following issues for this low-risk pregnancy and has Encounter for supervision of normal first pregnancy in third trimester; Umbilical hernia; and Thrombocytopenia during pregnancy (HCC) on her problem list.  Patient reports no complaints.  Contractions: Not present. Vag. Bleeding: None.  Movement: Present. Denies leaking of fluid.   The following portions of the patient's history were reviewed and updated as appropriate: allergies, current medications, past family history, past medical history, past social history, past surgical history and problem list. Problem list updated.  Objective:   Vitals:   04/10/17 1305  BP: 112/72  Pulse: 84  Weight: 159 lb 8 oz (72.3 kg)    Fetal Status: Fetal Heart Rate (bpm): 150 Fundal Height: 35 cm Movement: Present     General:  Alert, oriented and cooperative. Patient is in no acute distress.  Skin: Skin is warm and dry. No rash noted.   Cardiovascular: Normal heart rate noted  Respiratory: Normal respiratory effort, no problems with respiration noted  Abdomen: Soft, gravid, appropriate for gestational age. Pain/Pressure: Absent     Pelvic:  Cervical exam deferred        Extremities: Normal range of motion.  Edema: None  Mental Status: Normal mood and affect. Normal behavior. Normal judgment and thought content.   Assessment and Plan:  Pregnancy: G2P0010 at 4832w1d  1. Encounter for supervision of normal first pregnancy in third trimester Patient is doing well without complaints Cultures next visit  2. Thrombocytopenia during pregnancy (HCC) Will check CBC next week  Preterm labor symptoms and general obstetric precautions including but not limited to vaginal bleeding, contractions, leaking of fluid and fetal movement were reviewed in detail with the patient. Please refer to After  Visit Summary for other counseling recommendations.  Return in about 1 week (around 04/17/2017) for ROB.   Catalina AntiguaPeggy Aayan Haskew, MD

## 2017-04-16 ENCOUNTER — Ambulatory Visit (INDEPENDENT_AMBULATORY_CARE_PROVIDER_SITE_OTHER): Payer: Self-pay | Admitting: Family Medicine

## 2017-04-16 VITALS — BP 114/62 | HR 84 | Wt 159.3 lb

## 2017-04-16 DIAGNOSIS — O2343 Unspecified infection of urinary tract in pregnancy, third trimester: Secondary | ICD-10-CM

## 2017-04-16 DIAGNOSIS — D696 Thrombocytopenia, unspecified: Secondary | ICD-10-CM

## 2017-04-16 DIAGNOSIS — O99119 Other diseases of the blood and blood-forming organs and certain disorders involving the immune mechanism complicating pregnancy, unspecified trimester: Secondary | ICD-10-CM

## 2017-04-16 DIAGNOSIS — Z113 Encounter for screening for infections with a predominantly sexual mode of transmission: Secondary | ICD-10-CM

## 2017-04-16 DIAGNOSIS — O99113 Other diseases of the blood and blood-forming organs and certain disorders involving the immune mechanism complicating pregnancy, third trimester: Secondary | ICD-10-CM

## 2017-04-16 DIAGNOSIS — R300 Dysuria: Secondary | ICD-10-CM

## 2017-04-16 DIAGNOSIS — Z3403 Encounter for supervision of normal first pregnancy, third trimester: Secondary | ICD-10-CM

## 2017-04-16 LAB — POCT URINALYSIS DIP (DEVICE)
BILIRUBIN URINE: NEGATIVE
Glucose, UA: NEGATIVE mg/dL
Hgb urine dipstick: NEGATIVE
Ketones, ur: NEGATIVE mg/dL
Leukocytes, UA: NEGATIVE
Nitrite: NEGATIVE
PH: 6 (ref 5.0–8.0)
Protein, ur: 30 mg/dL — AB
Urobilinogen, UA: 0.2 mg/dL (ref 0.0–1.0)

## 2017-04-16 NOTE — Progress Notes (Signed)
   PRENATAL VISIT NOTE  Subjective:  Ammie DaltonEva V. Allene Dillonorres is a 36 y.o. G2P0010 at 1714w0d being seen today for ongoing prenatal care.  She is currently monitored for the following issues for this low-risk pregnancy and has Encounter for supervision of normal first pregnancy in third trimester; Umbilical hernia; and Thrombocytopenia during pregnancy (HCC) on her problem list.  Patient reports dysuria.  Contractions: Not present. Vag. Bleeding: None.  Movement: Present. Denies leaking of fluid.   The following portions of the patient's history were reviewed and updated as appropriate: allergies, current medications, past family history, past medical history, past social history, past surgical history and problem list. Problem list updated.  Objective:   Vitals:   04/16/17 1557  BP: 114/62  Pulse: 84  Weight: 159 lb 4.8 oz (72.3 kg)    Fetal Status: Fetal Heart Rate (bpm): 135 Fundal Height: 34 cm Movement: Present  Presentation: Vertex  General:  Alert, oriented and cooperative. Patient is in no acute distress.  Skin: Skin is warm and dry. No rash noted.   Cardiovascular: Normal heart rate noted  Respiratory: Normal respiratory effort, no problems with respiration noted  Abdomen: Soft, gravid, appropriate for gestational age. Pain/Pressure: Present     Pelvic:  Cervical exam performed Dilation: 1 Effacement (%): 50 Station: -1  Extremities: Normal range of motion.  Edema: None  Mental Status: Normal mood and affect. Normal behavior. Normal judgment and thought content.   Urinalysis    Component Value Date/Time   COLORURINE YELLOW 04/27/2015 1640   APPEARANCEUR HAZY (A) 04/27/2015 1640   LABSPEC 1.015 11/21/2016 1331   PHURINE 7.0 11/21/2016 1331   GLUCOSEU NEGATIVE 11/21/2016 1331   HGBUR NEGATIVE 11/21/2016 1331   BILIRUBINUR NEGATIVE 11/21/2016 1331   KETONESUR NEGATIVE 11/21/2016 1331   PROTEINUR NEGATIVE 11/21/2016 1331   UROBILINOGEN 0.2 11/21/2016 1331   NITRITE NEGATIVE  11/21/2016 1331   LEUKOCYTESUR TRACE (A) 11/21/2016 1331     Assessment and Plan:  Pregnancy: G2P0010 at 414w0d  1. Encounter for supervision of normal first pregnancy in third trimester Cultures today - Cervicovaginal ancillary only - Strep Gp B NAA  2. Thrombocytopenia during pregnancy (HCC) Repeat plt count - CBC  3. Dysuria Normal u/a--check culture  Preterm labor symptoms and general obstetric precautions including but not limited to vaginal bleeding, contractions, leaking of fluid and fetal movement were reviewed in detail with the patient. Please refer to After Visit Summary for other counseling recommendations.  Return in 1 week (on 04/23/2017).   Reva Boresanya S Jovon Streetman, MD

## 2017-04-16 NOTE — Patient Instructions (Signed)
 Third Trimester of Pregnancy The third trimester is from week 28 through week 40 (months 7 through 9). The third trimester is a time when the unborn baby (fetus) is growing rapidly. At the end of the ninth month, the fetus is about 20 inches in length and weighs 6-10 pounds. Body changes during your third trimester Your body will continue to go through many changes during pregnancy. The changes vary from woman to woman. During the third trimester:  Your weight will continue to increase. You can expect to gain 25-35 pounds (11-16 kg) by the end of the pregnancy.  You may begin to get stretch marks on your hips, abdomen, and breasts.  You may urinate more often because the fetus is moving lower into your pelvis and pressing on your bladder.  You may develop or continue to have heartburn. This is caused by increased hormones that slow down muscles in the digestive tract.  You may develop or continue to have constipation because increased hormones slow digestion and cause the muscles that push waste through your intestines to relax.  You may develop hemorrhoids. These are swollen veins (varicose veins) in the rectum that can itch or be painful.  You may develop swollen, bulging veins (varicose veins) in your legs.  You may have increased body aches in the pelvis, back, or thighs. This is due to weight gain and increased hormones that are relaxing your joints.  You may have changes in your hair. These can include thickening of your hair, rapid growth, and changes in texture. Some women also have hair loss during or after pregnancy, or hair that feels dry or thin. Your hair will most likely return to normal after your baby is born.  Your breasts will continue to grow and they will continue to become tender. A yellow fluid (colostrum) may leak from your breasts. This is the first milk you are producing for your baby.  Your belly button may stick out.  You may notice more swelling in your  hands, face, or ankles.  You may have increased tingling or numbness in your hands, arms, and legs. The skin on your belly may also feel numb.  You may feel short of breath because of your expanding uterus.  You may have more problems sleeping. This can be caused by the size of your belly, increased need to urinate, and an increase in your body's metabolism.  You may notice the fetus "dropping," or moving lower in your abdomen (lightening).  You may have increased vaginal discharge.  You may notice your joints feel loose and you may have pain around your pelvic bone.  What to expect at prenatal visits You will have prenatal exams every 2 weeks until week 36. Then you will have weekly prenatal exams. During a routine prenatal visit:  You will be weighed to make sure you and the baby are growing normally.  Your blood pressure will be taken.  Your abdomen will be measured to track your baby's growth.  The fetal heartbeat will be listened to.  Any test results from the previous visit will be discussed.  You may have a cervical check near your due date to see if your cervix has softened or thinned (effaced).  You will be tested for Group B streptococcus. This happens between 35 and 37 weeks.  Your health care provider may ask you:  What your birth plan is.  How you are feeling.  If you are feeling the baby move.  If you have   had any abnormal symptoms, such as leaking fluid, bleeding, severe headaches, or abdominal cramping.  If you are using any tobacco products, including cigarettes, chewing tobacco, and electronic cigarettes.  If you have any questions.  Other tests or screenings that may be performed during your third trimester include:  Blood tests that check for low iron levels (anemia).  Fetal testing to check the health, activity level, and growth of the fetus. Testing is done if you have certain medical conditions or if there are problems during the  pregnancy.  Nonstress test (NST). This test checks the health of your baby to make sure there are no signs of problems, such as the baby not getting enough oxygen. During this test, a belt is placed around your belly. The baby is made to move, and its heart rate is monitored during movement.  What is false labor? False labor is a condition in which you feel small, irregular tightenings of the muscles in the womb (contractions) that usually go away with rest, changing position, or drinking water. These are called Braxton Hicks contractions. Contractions may last for hours, days, or even weeks before true labor sets in. If contractions come at regular intervals, become more frequent, increase in intensity, or become painful, you should see your health care provider. What are the signs of labor?  Abdominal cramps.  Regular contractions that start at 10 minutes apart and become stronger and more frequent with time.  Contractions that start on the top of the uterus and spread down to the lower abdomen and back.  Increased pelvic pressure and dull back pain.  A watery or bloody mucus discharge that comes from the vagina.  Leaking of amniotic fluid. This is also known as your "water breaking." It could be a slow trickle or a gush. Let your health care provider know if it has a color or strange odor. If you have any of these signs, call your health care provider right away, even if it is before your due date. Follow these instructions at home: Medicines  Follow your health care provider's instructions regarding medicine use. Specific medicines may be either safe or unsafe to take during pregnancy.  Take a prenatal vitamin that contains at least 600 micrograms (mcg) of folic acid.  If you develop constipation, try taking a stool softener if your health care provider approves. Eating and drinking  Eat a balanced diet that includes fresh fruits and vegetables, whole grains, good sources of protein  such as meat, eggs, or tofu, and low-fat dairy. Your health care provider will help you determine the amount of weight gain that is right for you.  Avoid raw meat and uncooked cheese. These carry germs that can cause birth defects in the baby.  If you have low calcium intake from food, talk to your health care provider about whether you should take a daily calcium supplement.  Eat four or five small meals rather than three large meals a day.  Limit foods that are high in fat and processed sugars, such as fried and sweet foods.  To prevent constipation: ? Drink enough fluid to keep your urine clear or pale yellow. ? Eat foods that are high in fiber, such as fresh fruits and vegetables, whole grains, and beans. Activity  Exercise only as directed by your health care provider. Most women can continue their usual exercise routine during pregnancy. Try to exercise for 30 minutes at least 5 days a week. Stop exercising if you experience uterine contractions.  Avoid   heavy lifting.  Do not exercise in extreme heat or humidity, or at high altitudes.  Wear low-heel, comfortable shoes.  Practice good posture.  You may continue to have sex unless your health care provider tells you otherwise. Relieving pain and discomfort  Take frequent breaks and rest with your legs elevated if you have leg cramps or low back pain.  Take warm sitz baths to soothe any pain or discomfort caused by hemorrhoids. Use hemorrhoid cream if your health care provider approves.  Wear a good support bra to prevent discomfort from breast tenderness.  If you develop varicose veins: ? Wear support pantyhose or compression stockings as told by your healthcare provider. ? Elevate your feet for 15 minutes, 3-4 times a day. Prenatal care  Write down your questions. Take them to your prenatal visits.  Keep all your prenatal visits as told by your health care provider. This is important. Safety  Wear your seat belt at  all times when driving.  Make a list of emergency phone numbers, including numbers for family, friends, the hospital, and police and fire departments. General instructions  Avoid cat litter boxes and soil used by cats. These carry germs that can cause birth defects in the baby. If you have a cat, ask someone to clean the litter box for you.  Do not travel far distances unless it is absolutely necessary and only with the approval of your health care provider.  Do not use hot tubs, steam rooms, or saunas.  Do not drink alcohol.  Do not use any products that contain nicotine or tobacco, such as cigarettes and e-cigarettes. If you need help quitting, ask your health care provider.  Do not use any medicinal herbs or unprescribed drugs. These chemicals affect the formation and growth of the baby.  Do not douche or use tampons or scented sanitary pads.  Do not cross your legs for long periods of time.  To prepare for the arrival of your baby: ? Take prenatal classes to understand, practice, and ask questions about labor and delivery. ? Make a trial run to the hospital. ? Visit the hospital and tour the maternity area. ? Arrange for maternity or paternity leave through employers. ? Arrange for family and friends to take care of pets while you are in the hospital. ? Purchase a rear-facing car seat and make sure you know how to install it in your car. ? Pack your hospital bag. ? Prepare the baby's nursery. Make sure to remove all pillows and stuffed animals from the baby's crib to prevent suffocation.  Visit your dentist if you have not gone during your pregnancy. Use a soft toothbrush to brush your teeth and be gentle when you floss. Contact a health care provider if:  You are unsure if you are in labor or if your water has broken.  You become dizzy.  You have mild pelvic cramps, pelvic pressure, or nagging pain in your abdominal area.  You have lower back pain.  You have persistent  nausea, vomiting, or diarrhea.  You have an unusual or bad smelling vaginal discharge.  You have pain when you urinate. Get help right away if:  Your water breaks before 37 weeks.  You have regular contractions less than 5 minutes apart before 37 weeks.  You have a fever.  You are leaking fluid from your vagina.  You have spotting or bleeding from your vagina.  You have severe abdominal pain or cramping.  You have rapid weight loss or weight   gain.  You have shortness of breath with chest pain.  You notice sudden or extreme swelling of your face, hands, ankles, feet, or legs.  Your baby makes fewer than 10 movements in 2 hours.  You have severe headaches that do not go away when you take medicine.  You have vision changes. Summary  The third trimester is from week 28 through week 40, months 7 through 9. The third trimester is a time when the unborn baby (fetus) is growing rapidly.  During the third trimester, your discomfort may increase as you and your baby continue to gain weight. You may have abdominal, leg, and back pain, sleeping problems, and an increased need to urinate.  During the third trimester your breasts will keep growing and they will continue to become tender. A yellow fluid (colostrum) may leak from your breasts. This is the first milk you are producing for your baby.  False labor is a condition in which you feel small, irregular tightenings of the muscles in the womb (contractions) that eventually go away. These are called Braxton Hicks contractions. Contractions may last for hours, days, or even weeks before true labor sets in.  Signs of labor can include: abdominal cramps; regular contractions that start at 10 minutes apart and become stronger and more frequent with time; watery or bloody mucus discharge that comes from the vagina; increased pelvic pressure and dull back pain; and leaking of amniotic fluid. This information is not intended to replace advice  given to you by your health care provider. Make sure you discuss any questions you have with your health care provider. Document Released: 10/24/2001 Document Revised: 04/06/2016 Document Reviewed: 12/31/2012 [employer] Interactive Patient Education  2017 [employer]   Breastfeeding Deciding to breastfeed is one of the best choices you can make for you and your baby. A change in hormones during pregnancy causes your breast tissue to grow and increases the number and size of your milk ducts. These hormones also allow proteins, sugars, and fats from your blood supply to make breast milk in your milk-producing glands. Hormones prevent breast milk from being released before your baby is born as well as prompt milk flow after birth. Once breastfeeding has begun, thoughts of your baby, as well as his or her sucking or crying, can stimulate the release of milk from your milk-producing glands. Benefits of breastfeeding For Your Baby  Your first milk (colostrum) helps your baby's digestive system function better.  There are antibodies in your milk that help your baby fight off infections.  Your baby has a lower incidence of asthma, allergies, and sudden infant death syndrome.  The nutrients in breast milk are better for your baby than infant formulas and are designed uniquely for your baby's needs.  Breast milk improves your baby's brain development.  Your baby is less likely to develop other conditions, such as childhood obesity, asthma, or type 2 diabetes mellitus.  For You  Breastfeeding helps to create a very special bond between you and your baby.  Breastfeeding is convenient. Breast milk is always available at the correct temperature and costs nothing.  Breastfeeding helps to burn calories and helps you lose the weight gained during pregnancy.  Breastfeeding makes your uterus contract to its prepregnancy size faster and slows bleeding (lochia) after you give birth.  Breastfeeding helps  to lower your risk of developing type 2 diabetes mellitus, osteoporosis, and breast or ovarian cancer later in life.  Signs that your baby is hungry Early Signs of Hunger    Increased alertness or activity.  Stretching.  Movement of the head from side to side.  Movement of the head and opening of the mouth when the corner of the mouth or cheek is stroked (rooting).  Increased sucking sounds, smacking lips, cooing, sighing, or squeaking.  Hand-to-mouth movements.  Increased sucking of fingers or hands.  Late Signs of Hunger  Fussing.  Intermittent crying.  Extreme Signs of Hunger Signs of extreme hunger will require calming and consoling before your baby will be able to breastfeed successfully. Do not wait for the following signs of extreme hunger to occur before you initiate breastfeeding:  Restlessness.  A loud, strong cry.  Screaming.  Breastfeeding basics Breastfeeding Initiation  Find a comfortable place to sit or lie down, with your neck and back well supported.  Place a pillow or rolled up blanket under your baby to bring him or her to the level of your breast (if you are seated). Nursing pillows are specially designed to help support your arms and your baby while you breastfeed.  Make sure that your baby's abdomen is facing your abdomen.  Gently massage your breast. With your fingertips, massage from your chest wall toward your nipple in a circular motion. This encourages milk flow. You may need to continue this action during the feeding if your milk flows slowly.  Support your breast with 4 fingers underneath and your thumb above your nipple. Make sure your fingers are well away from your nipple and your baby's mouth.  Stroke your baby's lips gently with your finger or nipple.  When your baby's mouth is open wide enough, quickly bring your baby to your breast, placing your entire nipple and as much of the colored area around your nipple (areola) as possible into  your baby's mouth. ? More areola should be visible above your baby's upper lip than below the lower lip. ? Your baby's tongue should be between his or her lower gum and your breast.  Ensure that your baby's mouth is correctly positioned around your nipple (latched). Your baby's lips should create a seal on your breast and be turned out (everted).  It is common for your baby to suck about 2-3 minutes in order to start the flow of breast milk.  Latching Teaching your baby how to latch on to your breast properly is very important. An improper latch can cause nipple pain and decreased milk supply for you and poor weight gain in your baby. Also, if your baby is not latched onto your nipple properly, he or she may swallow some air during feeding. This can make your baby fussy. Burping your baby when you switch breasts during the feeding can help to get rid of the air. However, teaching your baby to latch on properly is still the best way to prevent fussiness from swallowing air while breastfeeding. Signs that your baby has successfully latched on to your nipple:  Silent tugging or silent sucking, without causing you pain.  Swallowing heard between every 3-4 sucks.  Muscle movement above and in front of his or her ears while sucking.  Signs that your baby has not successfully latched on to nipple:  Sucking sounds or smacking sounds from your baby while breastfeeding.  Nipple pain.  If you think your baby has not latched on correctly, slip your finger into the corner of your baby's mouth to break the suction and place it between your baby's gums. Attempt breastfeeding initiation again. Signs of Successful Breastfeeding Signs from your baby:  A   gradual decrease in the number of sucks or complete cessation of sucking.  Falling asleep.  Relaxation of his or her body.  Retention of a small amount of milk in his or her mouth.  Letting go of your breast by himself or herself.  Signs from  you:  Breasts that have increased in firmness, weight, and size 1-3 hours after feeding.  Breasts that are softer immediately after breastfeeding.  Increased milk volume, as well as a change in milk consistency and color by the fifth day of breastfeeding.  Nipples that are not sore, cracked, or bleeding.  Signs That Your Baby is Getting Enough Milk  Wetting at least 1-2 diapers during the first 24 hours after birth.  Wetting at least 5-6 diapers every 24 hours for the first week after birth. The urine should be clear or pale yellow by 5 days after birth.  Wetting 6-8 diapers every 24 hours as your baby continues to grow and develop.  At least 3 stools in a 24-hour period by age 5 days. The stool should be soft and yellow.  At least 3 stools in a 24-hour period by age 7 days. The stool should be seedy and yellow.  No loss of weight greater than 10% of birth weight during the first 3 days of age.  Average weight gain of 4-7 ounces (113-198 g) per week after age 4 days.  Consistent daily weight gain by age 5 days, without weight loss after the age of 2 weeks.  After a feeding, your baby may spit up a small amount. This is common. Breastfeeding frequency and duration Frequent feeding will help you make more milk and can prevent sore nipples and breast engorgement. Breastfeed when you feel the need to reduce the fullness of your breasts or when your baby shows signs of hunger. This is called "breastfeeding on demand." Avoid introducing a pacifier to your baby while you are working to establish breastfeeding (the first 4-6 weeks after your baby is born). After this time you may choose to use a pacifier. Research has shown that pacifier use during the first year of a baby's life decreases the risk of sudden infant death syndrome (SIDS). Allow your baby to feed on each breast as long as he or she wants. Breastfeed until your baby is finished feeding. When your baby unlatches or falls asleep  while feeding from the first breast, offer the second breast. Because newborns are often sleepy in the first few weeks of life, you may need to awaken your baby to get him or her to feed. Breastfeeding times will vary from baby to baby. However, the following rules can serve as a guide to help you ensure that your baby is properly fed:  Newborns (babies 4 weeks of age or younger) may breastfeed every 1-3 hours.  Newborns should not go longer than 3 hours during the day or 5 hours during the night without breastfeeding.  You should breastfeed your baby a minimum of 8 times in a 24-hour period until you begin to introduce solid foods to your baby at around 6 months of age.  Breast milk pumping Pumping and storing breast milk allows you to ensure that your baby is exclusively fed your breast milk, even at times when you are unable to breastfeed. This is especially important if you are going back to work while you are still breastfeeding or when you are not able to be present during feedings. Your lactation consultant can give you guidelines on how   long it is safe to store breast milk. A breast pump is a machine that allows you to pump milk from your breast into a sterile bottle. The pumped breast milk can then be stored in a refrigerator or freezer. Some breast pumps are operated by hand, while others use electricity. Ask your lactation consultant which type will work best for you. Breast pumps can be purchased, but some hospitals and breastfeeding support groups lease breast pumps on a monthly basis. A lactation consultant can teach you how to hand express breast milk, if you prefer not to use a pump. Caring for your breasts while you breastfeed Nipples can become dry, cracked, and sore while breastfeeding. The following recommendations can help keep your breasts moisturized and healthy:  Avoid using soap on your nipples.  Wear a supportive bra. Although not required, special nursing bras and tank  tops are designed to allow access to your breasts for breastfeeding without taking off your entire bra or top. Avoid wearing underwire-style bras or extremely tight bras.  Air dry your nipples for 3-4minutes after each feeding.  Use only cotton bra pads to absorb leaked breast milk. Leaking of breast milk between feedings is normal.  Use lanolin on your nipples after breastfeeding. Lanolin helps to maintain your skin's normal moisture barrier. If you use pure lanolin, you do not need to wash it off before feeding your baby again. Pure lanolin is not toxic to your baby. You may also hand express a few drops of breast milk and gently massage that milk into your nipples and allow the milk to air dry.  In the first few weeks after giving birth, some women experience extremely full breasts (engorgement). Engorgement can make your breasts feel heavy, warm, and tender to the touch. Engorgement peaks within 3-5 days after you give birth. The following recommendations can help ease engorgement:  Completely empty your breasts while breastfeeding or pumping. You may want to start by applying warm, moist heat (in the shower or with warm water-soaked hand towels) just before feeding or pumping. This increases circulation and helps the milk flow. If your baby does not completely empty your breasts while breastfeeding, pump any extra milk after he or she is finished.  Wear a snug bra (nursing or regular) or tank top for 1-2 days to signal your body to slightly decrease milk production.  Apply ice packs to your breasts, unless this is too uncomfortable for you.  Make sure that your baby is latched on and positioned properly while breastfeeding.  If engorgement persists after 48 hours of following these recommendations, contact your health care provider or a lactation consultant. Overall health care recommendations while breastfeeding  Eat healthy foods. Alternate between meals and snacks, eating 3 of each per  day. Because what you eat affects your breast milk, some of the foods may make your baby more irritable than usual. Avoid eating these foods if you are sure that they are negatively affecting your baby.  Drink milk, fruit juice, and water to satisfy your thirst (about 10 glasses a day).  Rest often, relax, and continue to take your prenatal vitamins to prevent fatigue, stress, and anemia.  Continue breast self-awareness checks.  Avoid chewing and smoking tobacco. Chemicals from cigarettes that pass into breast milk and exposure to secondhand smoke may harm your baby.  Avoid alcohol and drug use, including marijuana. Some medicines that may be harmful to your baby can pass through breast milk. It is important to ask your health care   provider before taking any medicine, including all over-the-counter and prescription medicine as well as vitamin and herbal supplements. It is possible to become pregnant while breastfeeding. If birth control is desired, ask your health care provider about options that will be safe for your baby. Contact a health care provider if:  You feel like you want to stop breastfeeding or have become frustrated with breastfeeding.  You have painful breasts or nipples.  Your nipples are cracked or bleeding.  Your breasts are red, tender, or warm.  You have a swollen area on either breast.  You have a fever or chills.  You have nausea or vomiting.  You have drainage other than breast milk from your nipples.  Your breasts do not become full before feedings by the fifth day after you give birth.  You feel sad and depressed.  Your baby is too sleepy to eat well.  Your baby is having trouble sleeping.  Your baby is wetting less than 3 diapers in a 24-hour period.  Your baby has less than 3 stools in a 24-hour period.  Your baby's skin or the white part of his or her eyes becomes yellow.  Your baby is not gaining weight by 5 days of age. Get help right away  if:  Your baby is overly tired (lethargic) and does not want to wake up and feed.  Your baby develops an unexplained fever. This information is not intended to replace advice given to you by your health care provider. Make sure you discuss any questions you have with your health care provider. Document Released: 10/30/2005 Document Revised: 04/12/2016 Document Reviewed: 04/23/2013 Elsevier Interactive Patient Education  2017 Elsevier Inc.  

## 2017-04-17 LAB — CBC
HEMATOCRIT: 37.2 % (ref 34.0–46.6)
Hemoglobin: 11.8 g/dL (ref 11.1–15.9)
MCH: 27.6 pg (ref 26.6–33.0)
MCHC: 31.7 g/dL (ref 31.5–35.7)
MCV: 87 fL (ref 79–97)
Platelets: 146 10*3/uL — ABNORMAL LOW (ref 150–379)
RBC: 4.27 x10E6/uL (ref 3.77–5.28)
RDW: 14.7 % (ref 12.3–15.4)
WBC: 9.1 10*3/uL (ref 3.4–10.8)

## 2017-04-17 LAB — CERVICOVAGINAL ANCILLARY ONLY
Chlamydia: NEGATIVE
Neisseria Gonorrhea: NEGATIVE

## 2017-04-18 LAB — STREP GP B NAA: Strep Gp B NAA: NEGATIVE

## 2017-04-20 ENCOUNTER — Telehealth: Payer: Self-pay | Admitting: General Practice

## 2017-04-20 NOTE — Telephone Encounter (Signed)
-----   Message from Reva Boresanya S Pratt, MD sent at 04/17/2017  8:18 AM EDT ----- Please inform patient, her platelets are good.

## 2017-04-20 NOTE — Telephone Encounter (Signed)
Called patient with [facility] Mcintosh for interpreter & informed her of Platelet results. Patient verbalized understanding & asked if her urine results were back yet. Told patient urine cultures take several days to come back and haven't resulted yet. Patient verbalized understanding & asked about STIs. Told patient it was negative as well. Patient verbalized understanding & had no other questions

## 2017-04-23 ENCOUNTER — Ambulatory Visit (INDEPENDENT_AMBULATORY_CARE_PROVIDER_SITE_OTHER): Payer: Self-pay | Admitting: Obstetrics & Gynecology

## 2017-04-23 VITALS — BP 117/64 | HR 98 | Wt 161.3 lb

## 2017-04-23 DIAGNOSIS — Z3403 Encounter for supervision of normal first pregnancy, third trimester: Secondary | ICD-10-CM

## 2017-04-23 NOTE — Progress Notes (Signed)
Breastfeeding tip reviewed 

## 2017-04-23 NOTE — Progress Notes (Signed)
Concerned about genital lesion appeared today   PRENATAL VISIT NOTE  Subjective:  Belinda DaltonEva V. Allene Mcintosh is a 36 y.o. G2P0010 at [redacted]w[redacted]d being seen today for ongoing prenatal care.  She is currently monitored for the following issues for this low-risk pregnancy and has Encounter for supervision of normal first pregnancy in third trimester; Umbilical hernia; and Thrombocytopenia during pregnancy (HCC) on her problem list.  Patient reports new vulvar lesion, concerned it may be STD.  Contractions: Not present. Vag. Bleeding: None.  Movement: Present. Denies leaking of fluid.   The following portions of the patient's history were reviewed and updated as appropriate: allergies, current medications, past family history, past medical history, past social history, past surgical history and problem list. Problem list updated.  Objective:   Vitals:   04/23/17 1339  BP: 117/64  Pulse: 98  Weight: 161 lb 4.8 oz (73.2 kg)    Fetal Status: Fetal Heart Rate (bpm): 158   Movement: Present     General:  Alert, oriented and cooperative. Patient is in no acute distress.  Skin: Skin is warm and dry. No rash noted.   Cardiovascular: Normal heart rate noted  Respiratory: Normal respiratory effort, no problems with respiration noted  Abdomen: Soft, gravid, appropriate for gestational age. Pain/Pressure: Present     Pelvic:  Cervical exam deferred        Extremities: Normal range of motion.  Edema: Trace  Mental Status: Normal mood and affect. Normal behavior. Normal judgment and thought content.   Assessment and Plan:  Pregnancy: G2P0010 at [redacted]w[redacted]d  1. 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 Please refer to After Visit  Summary for other counseling recommendations.  Return in about 1 week (around 04/30/2017). Info on HPV sent   Scheryl DarterJames Tamiya Colello, MD

## 2017-04-23 NOTE — Patient Instructions (Signed)
Verrugas genitales (Genital Warts) Las verrugas genitales son pequeos crecimientos en la zona genital o anal. Son causadas por un tipo de germen (virus del papiloma humano, VPH) que se transmite de una persona a otra durante las relaciones sexuales. Se puede transmitir durante el sexo vaginal, anal y oral. Si no se las trata, las verrugas genitales pueden causar otros problemas. CUIDADOS EN EL HOGAR Medicamentos  Aplquese los medicamentos de venta libre y los recetados solamente como se lo haya indicado el mdico.  No use los medicamentos indicados para el tratamiento de las verrugas de las manos.  Pregntele al mdico si puede usar cremas para aliviar la picazn. Instrucciones generales  No toque ni rasque las verrugas.  No mantenga relaciones sexuales hasta completar el tratamiento.  Informe a sus excompaeros sexuales y a los actuales sobre la afeccin que padece. Tal vez deban recibir tratamiento.  Concurra a todas las visitas de control como se lo haya indicado el mdico. Esto es importante.  Despus del tratamiento, use preservativos durante las relaciones sexuales. Otras indicaciones para las mujeres  Es posible que las mujeres que tienen verrugas genitales deban controlarse ms a menudo para detectar la presencia de cncer de cuello de tero.  Si queda embarazada, infrmele al mdico que tuvo verrugas genitales, ya que el germen puede transmitirse al feto. SOLICITE AYUDA SI:  Siente dolor en la zona de la piel tratada o esta est enrojecida e hinchada.  Tiene fiebre.  Siente un malestar generalizado.  Palpa bultos en la zona genital o en la anal, o alrededor de estas.  Tiene sangrado en la zona genital o la anal.  Siente dolor durante las relaciones sexuales. Esta informacin no tiene como fin reemplazar el consejo del mdico. Asegrese de hacerle al mdico cualquier pregunta que tenga. Document Released: 06/28/2011 Document Revised: 07/21/2015 Document Reviewed:  01/25/2015 Elsevier Interactive Patient Education  2018 Elsevier Inc.  

## 2017-04-25 LAB — URINE CULTURE, OB REFLEX

## 2017-04-25 LAB — CULTURE, OB URINE

## 2017-04-26 ENCOUNTER — Other Ambulatory Visit: Payer: Self-pay

## 2017-04-26 DIAGNOSIS — O2343 Unspecified infection of urinary tract in pregnancy, third trimester: Secondary | ICD-10-CM

## 2017-04-26 DIAGNOSIS — R300 Dysuria: Secondary | ICD-10-CM

## 2017-04-26 MED ORDER — NITROFURANTOIN MONOHYD MACRO 100 MG PO CAPS: 100.0000 mg | ORAL_CAPSULE | Freq: Two times a day (BID) | ORAL | 1 refills | Status: DC

## 2017-04-26 NOTE — Addendum Note (Signed)
Addended by: Jaynie CollinsANYANWU, Weylyn Ricciuti A on: 04/26/2017 12:17 PM   Modules accepted: Orders

## 2017-04-26 NOTE — Telephone Encounter (Signed)
Called patient inform her of results and need to [facility] [address] Patient voice understanding at this time.

## 2017-04-30 ENCOUNTER — Ambulatory Visit (INDEPENDENT_AMBULATORY_CARE_PROVIDER_SITE_OTHER): Payer: Self-pay | Admitting: Advanced Practice Midwife

## 2017-04-30 VITALS — BP 104/71 | HR 72 | Wt 166.5 lb

## 2017-04-30 DIAGNOSIS — N3943 Post-void dribbling: Secondary | ICD-10-CM

## 2017-04-30 DIAGNOSIS — O2343 Unspecified infection of urinary tract in pregnancy, third trimester: Secondary | ICD-10-CM

## 2017-04-30 DIAGNOSIS — A630 Anogenital (venereal) warts: Secondary | ICD-10-CM | POA: Insufficient documentation

## 2017-04-30 DIAGNOSIS — N9089 Other specified noninflammatory disorders of vulva and perineum: Secondary | ICD-10-CM

## 2017-04-30 DIAGNOSIS — O98313 Other infections with a predominantly sexual mode of transmission complicating pregnancy, third trimester: Secondary | ICD-10-CM

## 2017-04-30 DIAGNOSIS — R300 Dysuria: Secondary | ICD-10-CM

## 2017-04-30 HISTORY — DX: Anogenital (venereal) warts: A63.0

## 2017-04-30 MED ORDER — CEPHALEXIN 500 MG PO CAPS: 500.0000 mg | ORAL_CAPSULE | Freq: Four times a day (QID) | ORAL | 0 refills | Status: DC

## 2017-04-30 NOTE — Patient Instructions (Signed)
Breastfeeding Challenges and Solutions  Even though breastfeeding is natural, it can be challenging, especially in the first few weeks after childbirth. It is normal for problems to arise when starting to breastfeed your new baby, even if you have breastfed before. This document provides some solutions to the most common breastfeeding challenges.  Challenges and solutions  Challenge--Cracked or Sore Nipples  Cracked or sore nipples are commonly experienced by breastfeeding mothers. Cracked or sore nipples often are caused by inadequate latching (when your baby's mouth attaches to your breast to breastfeed). Soreness can also happen if your baby is not positioned properly at your breast. Although nipple cracking and soreness are common during the first week after birth, nipple pain is never normal. If you experience nipple cracking or soreness that lasts longer than 1 week or nipple pain, call your health care provider or lactation consultant.  Solution  Ensure proper latching and positioning of your baby by following the steps below:  · Find a comfortable place to sit or lie down, with your neck and back well supported.  · Place a pillow or rolled up blanket under your baby to bring him or her to the level of your breast (if you are seated).  · Make sure that your baby's abdomen is facing your abdomen.  · Gently massage your breast. With your fingertips, massage from your chest wall toward your nipple in a circular motion. This encourages milk flow. You may need to continue this action during the feeding if your milk flows slowly.  · Support your breast with 4 fingers underneath and your thumb above your nipple. Make sure your fingers are well away from your nipple and your baby’s mouth.  · Stroke your baby's lips gently with your finger or nipple.  · When your baby's mouth is open wide enough, quickly bring your baby to your breast, placing your entire nipple and as much of the colored area around your nipple  (areola) as possible into your baby's mouth.  ? More areola should be visible above your baby's upper lip than below the lower lip.  ? Your baby's tongue should be between his or her lower gum and your breast.  · Ensure that your baby's mouth is correctly positioned around your nipple (latched). Your baby's lips should create a seal on your breast and be turned out (everted).  · It is common for your baby to suck for about 2-3 minutes in order to start the flow of breast milk.    Signs that your baby has successfully latched on to your nipple include:  · Quietly tugging or quietly sucking without causing you pain.  · Swallowing heard between every 3-4 sucks.  · Muscle movement above and in front of his or her ears with sucking.    Signs that your baby has not successfully latched on to nipple include:  · Sucking sounds or smacking sounds from your baby while nursing.  · Nipple pain.    Ensure that your breasts stay moisturized and healthy by:  · Avoiding the use of soap on your nipples.  · Wearing a supportive bra. Avoid wearing underwire-style bras or tight bras.  · Air drying your nipples for 3-4 minutes after each feeding.  · Using only cotton bra pads to absorb breast milk leakage. Leaking of breast milk between feedings is normal. Be sure to change the pads if they become soaked with milk.  · Using lanolin on your nipples after nursing. Lanolin helps to maintain your   skin's normal moisture barrier. If you use pure lanolin you do not need to wash it off before feeding your baby again. Pure lanolin is not toxic to your baby. You may also hand express a few drops of breast milk and gently massage that milk into your nipples, allowing it to air dry.    Challenge--Breast Engorgement  Breast engorgement is the overfilling of your breasts with breast milk. In the first few weeks after giving birth, you may experience breast engorgement. Breast engorgement can make your breasts throb and feel hard, tightly stretched,  warm, and tender. Engorgement peaks about the fifth day after you give birth. Having breast engorgement does not mean you have to stop breastfeeding your baby.  Solution  · Breastfeed when you feel the need to reduce the fullness of your breasts or when your baby shows signs of hunger. This is called "breastfeeding on demand."  · Newborns (babies younger than 4 weeks) often breastfeed every 1-3 hours during the day. You may need to awaken your baby to feed if he or she is asleep at a feeding time.  · Do not allow your baby to sleep longer than 5 hours during the night without a feeding.  · Pump or hand express breast milk before breastfeeding to soften your breast, areola, and nipple.  · Apply warm, moist heat (in the shower or with warm water-soaked hand towels) just before feeding or pumping, or massage your breast before or during breastfeeding. This increases circulation and helps your milk to flow.  · Completely empty your breasts when breastfeeding or pumping. Afterward, wear a snug bra (nursing or regular) or tank top for 1-2 days to signal your body to slightly decrease milk production. Only wear snug bras or tank tops to treat engorgement. Tight bras typically should be avoided by breastfeeding mothers. Once engorgement is relieved, return to wearing regular, loose-fitting clothes.  · Apply ice packs to your breasts to lessen the pain from engorgement and relieve swelling, unless the ice is uncomfortable for you.  · Do not delay feedings. Try to relax when it is time to feed your baby. This helps to trigger your "let-down reflex," which releases milk from your breast.  · Ensure your baby is latched on to your breast and positioned properly while breastfeeding.  · Allow your baby to remain at your breast as long as he or she is latched on well and actively sucking. Your baby will let you know when he or she is done breastfeeding by pulling away from your breast or falling asleep.  · Avoid introducing bottles  or pacifiers to your baby in the early weeks of breastfeeding. Wait to introduce these things until after resolving any breastfeeding challenges.  · Try to pump your milk on the same schedule as when your baby would breastfeed if you are returning to work or away from home for an extended period.  · Drink plenty of fluids to avoid dehydration, which can eventually put you at greater risk of breast engorgement.    If you follow these suggestions, your engorgement should improve in 24-48 hours. If you are still experiencing difficulty, call your lactation consultant or health care provider.  Challenge--Plugged Milk Ducts  Plugged milk ducts occur when the duct does not drain milk effectively and becomes swollen. Wearing a tight-fitting nursing bra or having difficulty with latching may cause plugged milk ducts. Not drinking enough water (8-10 c [1.9-2.4 L] per day) can contribute to plugged milk ducts. Once a   duct has become plugged, hard lumps, soreness, and redness may develop in your breast.  Solution  Do not delay feedings. Feed your baby frequently and try to empty your breasts of milk at each feeding. Try breastfeeding from the affected side first so there is a better chance that the milk will drain completely from that breast. Apply warm, moist towels to your breasts for 5-10 minutes before feeding. Alternatively, a hot shower right before breastfeeding can provide the moist heat that can encourage milk flow. Gentle massage of the sore area before and during a feeding may also help. Avoid wearing tight clothing or bras that put pressure on your breasts. Wear bras that offer good support to your breasts, but avoid underwire bras. If you have a plugged milk duct and develop a fever, you need to see your health care provider.  Challenge--Mastitis  Mastitis is inflammation of your breast. It usually is caused by a bacterial infection and can cause flu-like symptoms. You may develop redness in your breast and a  fever. Often when mastitis occurs, your breast becomes firm, warm, and very painful. The most common causes of mastitis are poor latching, ineffective sucking from your baby, consistent pressure on your breast (possibly from wearing a tight-fitting bra or shirt that restricts the milk flow), unusual stress or fatigue, or missed feedings.  Solution  You will be given antibiotic medicine to treat the infection. It is still important to breastfeed frequently to empty your breasts. Continuing to breastfeed while you recover from mastitis will not harm your baby. Make sure your baby is positioned properly during every feeding. Apply moist heat to your breasts for a few minutes before feeding to help the milk flow and to help your breasts empty more easily.  Challenge--Thrush  Thrush is a yeast infection that can form on your nipples, in your breast, or in your baby's mouth. It causes itching, soreness, burning or stabbing pain, and sometimes a rash.  Solution  You will be given a medicated ointment for your nipples, and your baby will be given a liquid medicine for his or her mouth. It is important that you and your baby are treated at the same time because thrush can be passed between you and your baby. Change disposable nursing pads often. Any bras, towels, or clothing that come in contact with infected areas of your body or your baby's body need to be washed in very hot water every day. Wash your hands and your baby's hands often. All pacifiers, bottle nipples, or toys your baby puts in his or her mouth should be boiled once a day for 20 minutes. After 1 week of treatment, discard pacifiers and bottle nipples and buy new ones. All breast pump parts that touch the milk need to be boiled for 20 minutes every day.  Challenge--Low Milk Supply  You may not be producing enough milk if your baby is not gaining the proper amount of weight. Breast milk production is based on a supply-and-demand system. Your milk supply depends  on how frequently and effectively your baby empties your breast.  Solution  The more you breastfeed and pump, the more breast milk you will produce. It is important that your baby empties at least one of your breasts at each feeding. If this is not happening, then use a breast pump or hand express any milk that remains. This will help to drain as much milk as possible at each feeding. It will also signal your body to produce more   milk. If your baby is not emptying your breasts, it may be due to latching, sucking, or positioning problems. If low milk supply continues after addressing these issues, contact your health care provider or a lactation specialist as soon as possible.  Challenge--Inverted or Flat Nipples  Some women have nipples that turn inward instead of protruding outward. Other women have nipples that are flat. Inverted or flat nipples can sometimes make it more difficult for your baby to latch onto your breast.  Solution  You may be given a small device that pulls out inverted nipples. This device should be applied right before your baby is brought to your breast. You can also try using a breast pump for a short time before placing the baby at your breast. The pump can pull your nipple outwards to help your infant latch more easily. The baby's sucking motion will help the inverted nipple protrude as well.  If you have flat nipples, encourage your baby to latch onto your breast and feed frequently in the early days after birth. This will give your baby practice latching on correctly while your breast is still soft. When your milk supply increases, between the second and fifth day after birth and your breasts become full, your baby will have an easier time latching.  Contact a lactation consultant if you still have concerns. She or he can teach you additional techniques to address breastfeeding problems related to nipple shape and position.  Where to find more information:  [employer]:  www.llli.org  This information is not intended to replace advice given to you by your health care provider. Make sure you discuss any questions you have with your health care provider.  Document Released: 04/23/2006 Document Revised: 04/12/2016 Document Reviewed: 04/25/2013  Elsevier Interactive Patient Education © 2017 Elsevier Inc.

## 2017-04-30 NOTE — Progress Notes (Signed)
   PRENATAL VISIT NOTE  Subjective:  Belinda Mcintosh is a 36 y.o. G2P0010 at 7578w0d being seen today for ongoing prenatal care.  She is currently monitored for the following issues for this low-risk pregnancy and has Encounter for supervision of normal first pregnancy in third trimester; Umbilical hernia; and Thrombocytopenia during pregnancy (HCC) on her problem list.  Patient reports urinary frequency, possible leaking urine. Concerned about vulvar lesion that was evaluated last week. No change in lesion. No pain.  Dx'd w/ UTI from last visit, but ABX Rx was not at Pharmacy. Contractions: Not present. Vag. Bleeding: None.  Movement: Present. Denies leaking of fluid.   The following portions of the patient's history were reviewed and updated as appropriate: allergies, current medications, past family history, past medical history, past social history, past surgical history and problem list. Problem list updated.  Objective:   Vitals:   04/30/17 1429  BP: 104/71  Pulse: 72  Weight: 166 lb 8 oz (75.5 kg)    Fetal Status: Fetal Heart Rate (bpm): 145 Fundal Height: 37 cm Movement: Present  Presentation: Vertex by US  General:  Alert, oriented and cooperative. Patient is in no acute distress.  Skin: Skin is warm and dry. No rash noted.   Cardiovascular: Normal heart rate noted  Respiratory: Normal respiratory effort, no problems with respiration noted  Abdomen: Soft, gravid, appropriate for gestational age. Pain/Pressure: Present     Pelvic:  Cervical exam performed Dilation: Fingertip Effacement (%): 50 Station: Ballotable. Two lesions. #1: 3 mm, NT, filiform, non-vesicular lesion at posterior fourchette. #2: Same appearance slightly superior to other lesion, 1 mm. HSV swab performed.   Extremities: Normal range of motion.     Mental Status: Normal mood and affect. Normal behavior. Normal judgment and thought content.   Assessment and Plan:  Pregnancy: G2P0010 at 6378w0d  1. Dysuria - Rx  Keflex. Paper Rx given.   2. UTI (urinary tract infection) during pregnancy, third trimester - Keflex  3. Lesion of vulva--C/W genital wart, NOT HSV.  - Previous order not being processed. Specimen recollected. - Herpes simplex virus culture  4. Post-void dribbling--Likely 2/2 UTI - Keflex - POCT Ferning--Neg  Term labor symptoms and general obstetric precautions including but not limited to vaginal bleeding, contractions, leaking of fluid and fetal movement were reviewed in detail with the patient. Please refer to After Visit Summary for other counseling recommendations.  Return in about 1 week (around 05/07/2017) for ROB.   Belinda Mcintosh, CNM

## 2017-04-30 NOTE — Progress Notes (Signed)
Vertex presentation per bedside US today.

## 2017-05-03 LAB — HERPES SIMPLEX VIRUS CULTURE

## 2017-05-07 ENCOUNTER — Ambulatory Visit (INDEPENDENT_AMBULATORY_CARE_PROVIDER_SITE_OTHER): Payer: Self-pay | Admitting: Advanced Practice Midwife

## 2017-05-07 ENCOUNTER — Encounter: Payer: Self-pay | Admitting: Advanced Practice Midwife

## 2017-05-07 VITALS — BP 113/70 | HR 81 | Wt 162.0 lb

## 2017-05-07 DIAGNOSIS — Z113 Encounter for screening for infections with a predominantly sexual mode of transmission: Secondary | ICD-10-CM

## 2017-05-07 DIAGNOSIS — N898 Other specified noninflammatory disorders of vagina: Secondary | ICD-10-CM

## 2017-05-07 DIAGNOSIS — R070 Pain in throat: Secondary | ICD-10-CM | POA: Insufficient documentation

## 2017-05-07 DIAGNOSIS — R197 Diarrhea, unspecified: Secondary | ICD-10-CM

## 2017-05-07 DIAGNOSIS — R300 Dysuria: Secondary | ICD-10-CM

## 2017-05-07 DIAGNOSIS — O2343 Unspecified infection of urinary tract in pregnancy, third trimester: Secondary | ICD-10-CM

## 2017-05-07 DIAGNOSIS — N3000 Acute cystitis without hematuria: Secondary | ICD-10-CM

## 2017-05-07 DIAGNOSIS — N390 Urinary tract infection, site not specified: Secondary | ICD-10-CM | POA: Insufficient documentation

## 2017-05-07 MED ORDER — NITROFURANTOIN MONOHYD MACRO 100 MG PO CAPS: 100.0000 mg | ORAL_CAPSULE | Freq: Two times a day (BID) | ORAL | 1 refills | Status: DC

## 2017-05-07 MED ORDER — PHENAZOPYRIDINE HCL 100 MG PO TABS: 100.0000 mg | ORAL_TABLET | Freq: Three times a day (TID) | ORAL | 0 refills | Status: DC | PRN

## 2017-05-07 NOTE — Progress Notes (Signed)
   PRENATAL VISIT NOTE  Subjective:  Belinda Mcintosh is a 36 y.o. G2P0010 at 7327w0d being seen today for ongoing prenatal care.  She is currently monitored for the following issues for this low-risk pregnancy and has Encounter for supervision of normal first pregnancy in third trimester; Umbilical hernia; Thrombocytopenia during pregnancy (HCC); Genital warts complicating pregnancy, third trimester; UTI (urinary tract infection); and Throat discomfort on her problem list.  Patient reports Dysuria, diarrhea ( 5/day x 1 month), vaginal irritation, throat with abnormal sensation, worried it is HPV or cancer.  Contractions: Not present. Vag. Bleeding: None.  Movement: Present. Denies leaking of fluid.   The following portions of the patient's history were reviewed and updated as appropriate: allergies, current medications, past family history, past medical history, past social history, past surgical history and problem list. Problem list updated.  Objective:   Vitals:   05/07/17 1249  BP: 113/70  Pulse: 81  Weight: 162 lb (73.5 kg)    Fetal Status: Fetal Heart Rate (bpm): 140   Movement: Present     General:  Alert, oriented and cooperative. Patient is in no acute distress.  Skin: Skin is warm and dry. No rash noted.   Cardiovascular: Normal heart rate noted  Respiratory: Normal respiratory effort, no problems with respiration noted  Abdomen: Soft, gravid, appropriate for gestational age. Pain/Pressure: Present     Pelvic:  Cervical exam performed        Cervx FT/50/B/vtx  Extremities: Normal range of motion.  Edema: Mild pitting, slight indentation  Mental Status: Normal mood and affect. Normal behavior. Normal judgment and thought content.   Assessment and Plan:  Pregnancy: G2P0010 at 6427w0d  1. Dysuria     D/C Keflex     Rx Macrobid and Pyridium (per request) - Cervicovaginal ancillary only  2. UTI (urinary tract infection) during pregnancy, third trimester     See above -  Cervicovaginal ancillary only  3. Diarrhea, unspecified type      States has had it for a month, 5x/day - Stool Culture - Ova and parasite examination - Cervicovaginal ancillary only  4. Vaginal irritation     Testing done  5. Acute cystitis without hematuria   6. Throat discomfort      May need referral to ENT if persists  Term labor symptoms and general obstetric precautions including but not limited to vaginal bleeding, contractions, leaking of fluid and fetal movement were reviewed in detail with the patient. Please refer to After Visit Summary for other counseling recommendations.  Return in about 1 week (around 05/14/2017) for [facility].   Wynelle BourgeoisMarie Arhan Mcmanamon, CNM

## 2017-05-07 NOTE — Patient Instructions (Signed)
Genital Warts Genital warts are a common STD (sexually transmitted disease). They may appear as small bumps on the tissues of the genital area or anal area. Sometimes, they can become irritated and cause pain. Genital warts are easily passed to other people through sexual contact. Getting treatment is important because genital warts can lead to other problems. In females, the virus that causes genital warts may increase the risk of cervical cancer. What are the causes? Genital warts are caused by a virus that is called human papillomavirus (HPV). HPV is spread by having unprotected sex with an infected person. It can be spread through vaginal, anal, and oral sex. Many people do not know that they are infected. They may be infected for years without problems. However, even if they do not have problems, they can pass the infection to their sexual partners. What increases the risk? Genital warts are more likely to develop in:  People who have unprotected sex.  People who have multiple sexual partners.  People who become sexually active before they are 36 years of age.  Men who are not circumcised.  Women who have a female sexual partner who is not circumcised.  People who have a weakened body defense system (immune system) due to disease or medicine.  People who smoke.  What are the signs or symptoms? Symptoms of genital warts include:  Small growths in the genital area or anal area. These warts often grow in clusters.  Itching and irritation in the genital area or anal area.  Bleeding from the warts.  Painful sexual intercourse.  How is this diagnosed? Genital warts can usually be diagnosed from their appearance on the vagina, vulva, penis, perineum, anus, or rectum. Tests may also be done, such as:  Biopsy. A tissue sample is removed so it can be looked at under a microscope.  Colposcopy. In females, a magnifying tool is used to examine the vagina and cervix. Certain solutions may  be used to make the HPV cells change color so they can be seen more easily.  A Pap test in females.  Tests for other STDs.  How is this treated? Treatment for genital warts may include:  Applying prescription medicines to the warts. These may be solutions or creams.  Freezing the warts with liquid nitrogen (cryotherapy).  Burning the warts with: ? Laser treatment. ? An electrified probe (electrocautery).  Injecting a substance (Candida antigen or Trichophyton antigen) into the warts to help the body's immune system to fight off the warts.  Interferon injections.  Surgery to remove the warts.  Follow these instructions at home: Medicines  Apply over-the-counter and prescription medicines only as told by your health care provider.  Do not treat genital warts with medicines that are used for treating hand warts.  Talk with your health care provider about using over-the-counter anti-itch creams. General instructions  Do not touch or scratch the warts.  Do not have sex until your treatment has been completed.  Tell your current and past sexual partners about your condition because they may also need treatment.  Keep all follow-up visits as told by your health care provider. This is important.  After treatment, use condoms during sex to prevent future infections. Other Instructions for Women  Women who have genital warts might need increased screening for cervical cancer. This type of cancer is slow growing and can be cured if it is found early. Chances of developing cervical cancer are increased with HPV.  If you become pregnant, tell your health   care provider that you have had HPV. Your health care provider will monitor you closely during pregnancy to be sure that your baby is safe. How is this prevented? Talk with your health care provider about getting the HPV vaccines. These vaccines prevent some HPV infections and cancers. It is recommended that the vaccine be given to  males and females who are 199-36 years of age. It will not work if you already have HPV, and it is not recommended for pregnant women. Contact a health care provider if:  You have redness, swelling, or pain in the area of the treated skin.  You have a fever.  You feel generally ill.  You feel lumps in and around your genital area or anal area.  You have bleeding in your genital area or anal area.  You have pain during sexual intercourse. This information is not intended to replace advice given to you by your health care provider. Make sure you discuss any questions you have with your health care provider. Document Released: 10/27/2000 Document Revised: 04/06/2016 Document Reviewed: 01/25/2015 Elsevier Interactive Patient Education  2018 [employer] Diarrhea, Adult Diarrhea is frequent loose and watery bowel movements. Diarrhea can make you feel weak and cause you to become dehydrated. Dehydration can make you tired and thirsty, cause you to have a dry mouth, and decrease how often you urinate. Diarrhea typically lasts 2-3 days. However, it can last longer if it is a sign of something more serious. It is important to treat your diarrhea as told by your health care provider. Follow these instructions at home: Eating and drinking  Follow these recommendations as told by your health care provider:  Take an oral rehydration solution (ORS). This is a drink that is sold at pharmacies and retail stores.  Drink clear fluids, such as water, ice chips, diluted fruit juice, and low-calorie sports drinks.  Eat bland, easy-to-digest foods in small amounts as you are able. These foods include bananas, applesauce, rice, lean meats, toast, and crackers.  Avoid drinking fluids that contain a lot of sugar or caffeine, such as energy drinks, sports drinks, and soda.  Avoid alcohol.  Avoid spicy or fatty foods.  General instructions  Drink enough fluid to keep your urine clear or pale  yellow.  Wash your hands often. If soap and water are not available, use hand sanitizer.  Make sure that all people in your household wash their hands well and often.  Take over-the-counter and prescription medicines only as told by your health care provider.  Rest at home while you recover.  Watch your condition for any changes.  Take a warm bath to relieve any burning or pain from frequent diarrhea episodes.  Keep all follow-up visits as told by your health care provider. This is important. Contact a health care provider if:  You have a fever.  Your diarrhea gets worse.  You have new symptoms.  You cannot keep fluids down.  You feel light-headed or dizzy.  You have a headache  You have muscle cramps. Get help right away if:  You have chest pain.  You feel extremely weak or you faint.  You have bloody or black stools or stools that look like tar.  You have severe pain, cramping, or bloating in your abdomen.  You have trouble breathing or you are breathing very quickly.  Your heart is beating very quickly.  Your skin feels cold and clammy.  You feel confused.  You have signs of dehydration, such as: ? Dark  urine, very little urine, or no urine. ? Cracked lips. ? Dry mouth. ? Sunken eyes. ? Sleepiness. ? Weakness. This information is not intended to replace advice given to you by your health care provider. Make sure you discuss any questions you have with your health care provider. Document Released: 10/20/2002 Document Revised: 03/09/2016 Document Reviewed: 07/06/2015 Elsevier Interactive Patient Education  2017 [employer].

## 2017-05-08 ENCOUNTER — Other Ambulatory Visit: Payer: Self-pay | Admitting: Advanced Practice Midwife

## 2017-05-08 LAB — CERVICOVAGINAL ANCILLARY ONLY
BACTERIAL VAGINITIS: NEGATIVE
CANDIDA VAGINITIS: POSITIVE — AB
CHLAMYDIA, DNA PROBE: NEGATIVE
Neisseria Gonorrhea: NEGATIVE
Trichomonas: NEGATIVE

## 2017-05-08 MED ORDER — FLUCONAZOLE 150 MG PO TABS: 150.0000 mg | ORAL_TABLET | Freq: Once | ORAL | 0 refills | Status: AC

## 2017-05-08 NOTE — Progress Notes (Unsigned)
Yeast  Rx Diflucan Sent to pharmacy Wynelle BourgeoisMarie Conroy Goracke CNM

## 2017-05-09 ENCOUNTER — Telehealth: Payer: Self-pay | Admitting: *Deleted

## 2017-05-09 NOTE — Telephone Encounter (Signed)
I called Belinda Mcintosh and she states she doesn't need interpreter.  I informed her provider wanted to let her know she has a yeast infection and she send an rx to her pharmacy for diflucan one pill once. She States she already picked up the rx and understands. No questions voiced.

## 2017-05-09 NOTE — Telephone Encounter (Signed)
-----   Message from Aviva SignsMarie L Williams, CNM sent at 05/08/2017  9:16 PM EDT ----- Regarding: Yeast Rx I sent an Rx for diflucan for yeast Belinda LiasMarie

## 2017-05-14 ENCOUNTER — Ambulatory Visit (INDEPENDENT_AMBULATORY_CARE_PROVIDER_SITE_OTHER): Payer: Self-pay | Admitting: Obstetrics and Gynecology

## 2017-05-14 ENCOUNTER — Encounter (HOSPITAL_COMMUNITY): Payer: Self-pay | Admitting: *Deleted

## 2017-05-14 ENCOUNTER — Telehealth (HOSPITAL_COMMUNITY): Payer: Self-pay | Admitting: *Deleted

## 2017-05-14 ENCOUNTER — Encounter: Payer: Self-pay | Admitting: Obstetrics and Gynecology

## 2017-05-14 VITALS — BP 122/75 | HR 96 | Wt 163.7 lb

## 2017-05-14 DIAGNOSIS — O99119 Other diseases of the blood and blood-forming organs and certain disorders involving the immune mechanism complicating pregnancy, unspecified trimester: Secondary | ICD-10-CM

## 2017-05-14 DIAGNOSIS — Z3403 Encounter for supervision of normal first pregnancy, third trimester: Secondary | ICD-10-CM

## 2017-05-14 DIAGNOSIS — O09523 Supervision of elderly multigravida, third trimester: Secondary | ICD-10-CM

## 2017-05-14 DIAGNOSIS — D696 Thrombocytopenia, unspecified: Secondary | ICD-10-CM

## 2017-05-14 DIAGNOSIS — O09529 Supervision of elderly multigravida, unspecified trimester: Secondary | ICD-10-CM | POA: Insufficient documentation

## 2017-05-14 DIAGNOSIS — O99113 Other diseases of the blood and blood-forming organs and certain disorders involving the immune mechanism complicating pregnancy, third trimester: Secondary | ICD-10-CM

## 2017-05-14 HISTORY — DX: Supervision of elderly multigravida, unspecified trimester: O09.529

## 2017-05-14 MED ORDER — FLUCONAZOLE 150 MG PO TABS: 150.0000 mg | ORAL_TABLET | Freq: Once | ORAL | 0 refills | Status: AC

## 2017-05-14 NOTE — Progress Notes (Signed)
Subjective:  Ammie DaltonEva V. Allene Dillonorres is a 36 y.o. G2P0010 at [redacted]w[redacted]d being seen today for ongoing prenatal care.  She is currently monitored for the following issues for this high-risk pregnancy and has Encounter for supervision of normal first pregnancy in third trimester; Umbilical hernia; Thrombocytopenia during pregnancy (HCC); Genital warts complicating pregnancy, third trimester; Throat discomfort; and AMA (advanced maternal age) multigravida 35+ on her problem list.  Patient reports no complaints.  Contractions: Not present. Vag. Bleeding: None.  Movement: Present. Denies leaking of fluid.   The following portions of the patient's history were reviewed and updated as appropriate: allergies, current medications, past family history, past medical history, past social history, past surgical history and problem list. Problem list updated.  Objective:   Vitals:   05/14/17 1302  BP: 122/75  Pulse: 96  Weight: 163 lb 11.2 oz (74.3 kg)    Fetal Status: Fetal Heart Rate (bpm): 130   Movement: Present     General:  Alert, oriented and cooperative. Patient is in no acute distress.  Skin: Skin is warm and dry. No rash noted.   Cardiovascular: Normal heart rate noted  Respiratory: Normal respiratory effort, no problems with respiration noted  Abdomen: Soft, gravid, appropriate for gestational age. Pain/Pressure: Present     Pelvic:  Cervical exam performed        Extremities: Normal range of motion.  Edema: Trace  Mental Status: Normal mood and affect. Normal behavior. Normal judgment and thought content.   Urinalysis:      Assessment and Plan:  Pregnancy: G2P0010 at [redacted]w[redacted]d  1. Encounter for supervision of normal first pregnancy in third trimester Follow up for UTI and pt did not pick up Rx for yeast infection - Urine Culture - fluconazole (DIFLUCAN) 150 MG tablet; Take 1 tablet (150 mg total) by mouth once. Repeat in 3 days  Dispense: 2 tablet; Refill: 0 Labor precautions AFI/NST Wed/Thurs IOL  next Monday at 41 weeks 2. Thrombocytopenia during pregnancy (HCC) Stable  3. Elderly multigravida in third trimester Genetic testing completed  Term labor symptoms and general obstetric precautions including but not limited to vaginal bleeding, contractions, leaking of fluid and fetal movement were reviewed in detail with the patient. Please refer to After Visit Summary for other counseling recommendations.  Return in about 6 weeks (around 06/25/2017).   Hermina StaggersErvin, Rechel Delosreyes L, MD

## 2017-05-14 NOTE — Telephone Encounter (Signed)
Preadmission screen Interpreter number [phone]

## 2017-05-14 NOTE — Patient Instructions (Signed)
Parto vaginal (Vaginal Delivery) Durante el parto, el mdico la ayudar a dar a luz a su beb. En elparto vaginal, deber pujar para que el beb salga por la vagina. Sin embargo, antes de que pueda sacar al beb, es necesario que ocurran ciertas cosas. La abertura del tero (cuello del tero) tiene que ablandarse, hacerse ms delgado y abrirse (dilatar) hasta que llegue a 10 cm. Adems, el beb tiene que bajar desde el tero a la vagina. SIGNOS DE TRABAJO DE PARTO  El mdico tendr primero que asegurarse de que usted est en trabajo de parto. Algunos signos son:   Eliminar lo que se llama tapn mucoso antes del inicio del trabajo de parto. Este es una pequea cantidad de mucosidad teida con sangre.  Tener contracciones uterinas regulares y dolorosas.   El tiempo entre las contracciones debe acortarse  Las molestias y el dolor se harn ms intensos gradualmente.  El dolor de las contracciones empeora al caminar y no se alivia con el reposo.   El cuello del tero se hace mas delgado (se borra) y se dilata. ANTES DEL PARTO Una vez que se inicie el trabajo de parto y sea admitida en el hospital o sanatorio, el mdico podr hacer lo siguiente:   Realizar un examen fsico.  Controlar si hay complicaciones relacionadas con el trabajo de parto.  Verificar su presin arterial, temperatura y pulso y la frecuencia cardaca (signos vitales).   Determinar si se ha roto el saco amnitico y cundo ha ocurrido.  Realizar un examen vaginal (utilizando un guante estril y un lubricante) para determinar: ? La posicin (presentacin) del beb. El beb se presenta con la cabeza primero (vertex) en el canal de parto (vagina), o estn los pies o las nalgas primero (de nalgas)? ? El nivel (estacin) de la cabeza del beb dentro del canal de parto. ? El borramiento y la dilatacin del cuello uterino  El monitor fetal electrnico generalmente se coloca sobre el abdomen al llegar. Se utiliza para  controlar las contracciones y la frecuencia cardaca del beb. ? Cuando el monitor est en el abdomen (monitor fetal externo), slo toma la frecuencia y la duracin de las contracciones. No informa acerca de la intensidad de las contracciones. ? Si el mdico necesita saber exactamente la intensidad de las contracciones o cul es la frecuencia cardaca del beb, colocar un monitor interno en la vagina y el tero. El mdico comentar los riesgos y los beneficios de usar un monitor interno y le pedir autorizacin antes de colocar el dispositivo. ? El monitoreo fetal continuo ser necesario si le han aplicado una epidural, si le administran ciertos medicamentos (como oxitocina) y si tiene complicaciones del embarazo o del trabajo de parto.  Podrn colocarle una va intravenosa en una vena del brazo para suministrarle lquidos y medicamentos, si es necesario. TRES ETAPAS DEL TRABAJO DE PARTO Y EL PARTO El trabajo de parto y el parto normales se dividen en tres etapas. Primera etapa Esta etapa comienza cuando comienzan las contracciones regulares y el cuello comienza a borrarse y dilatarse. Finaliza cuando el cuello est completamente abierto (completamente dilatado). La primera etapa es la etapa ms larga del trabajo de parto y puede durar desde 3 horas a 15 horas.  Algunos mtodos estn disponibles para ayudar con el dolor del parto. Usted y su mdico decidirn qu opcin es la mejor para usted. Las opciones incluyen:   Medicamentos narcticos. Estos son medicamentos fuertes que usted puede recibir a travs de una va intravenosa o   como inyeccin en el msculo. Estos medicamentos alivian el dolor pero no hacen que desaparezca completamente.  Epidural. Se administra un medicamento a travs de un tubo delgado que se inserta en la espalda. El medicamento adormece la parte inferior del cuerpo y evita el dolor en esa zona.  Bloqueo paracervical Es una inyeccin de un anestsico en cada lado del cuello  uterino.  Usted podr pedir un parto natural, que implica que no se usen analgsicos ni epidural durante el parto y el trabajo de parto. En cambio, podr tener otro tipo de ayuda como ejercicios respiratorios para hacer frente al dolor. Segunda etapa La segunda etapa del trabajo de parto comienza cuando el cuello se ha dilatado completamente a 10 cm. Contina hasta que usted puja al beb hacia abajo, por el canal de parto, y el beb nace. Esta etapa puede durar slo algunos minutos o algunas horas.  La posicin del la cabeza del beb a medida que pasa por el canal de parto, es informada como un nmero, llamado estacin. Si la cabeza del beb no ha iniciado su descenso, la estacin se describe como que est en menos 3 (-3). Cuando la cabeza del beb est en la estacin cero, est en el medio del canal de parto y se encaja en la pelvis. La estacin en la que se encuentra el beb indica el progreso de la segunda etapa del trabajo de parto.  Cuando el beb nace, el mdico lo sostendr con la cabeza hacia abajo para evitar que el lquido amnitico, el moco y la sangre entren en los pulmones del beb. La boca y la nariz del beb podrn ser succionadas con un pequeo bulbo para retirar todo lquido adicional.  El mdico podr colocar al beb sobre su estmago. Es importante evitar que el beb tome fro. Para hacerlo, el mdico secar al beb, lo colocar directamente sobre su piel, (sin mantas entre usted y el beb) y lo cubrir con mantas secas y tibias.  Se corta el cordn umbilical. Tercera etapa Durante la tercera etapa del trabajo de parto, el mdico sacar la placenta (alumbramiento) y se asegurar de que el sangrado est controlado. La salida de la placenta generalmente demora 5 minutos pero puede tardar hasta 30 minutos. Luego de la salida de la placenta, le darn un medicamento por va intravenosa o inyectable para ayudar a contraer el tero y controlar el sangrado. Si planea amamantar al beb,  puede intentar en este momento Luego de la salida de la placenta, el tero debe contraerse y quedar muy firme. Si el tero no queda firme, el mdico lo masajear. Esto es importante debido a que la contraccin del tero ayuda a cortar el sangrado en el sitio en que la placenta estaba unida al tero. Si el tero no se contrae adecuadamente ni permanece firme, podr causar un sangrado abundante. Si hay mucho sangrado, podrn darle medicamentos para contraer el tero y detener el sangrado.  Esta informacin no tiene como fin reemplazar el consejo del mdico. Asegrese de hacerle al mdico cualquier pregunta que tenga. Document Released: 10/12/2008 Document Revised: 11/20/2014 Elsevier Interactive Patient Education  2017 [employer] Inc[employer]  

## 2017-05-16 ENCOUNTER — Other Ambulatory Visit: Payer: Self-pay | Admitting: Advanced Practice Midwife

## 2017-05-16 LAB — URINE CULTURE

## 2017-05-17 ENCOUNTER — Ambulatory Visit (INDEPENDENT_AMBULATORY_CARE_PROVIDER_SITE_OTHER): Payer: Self-pay | Admitting: *Deleted

## 2017-05-17 ENCOUNTER — Ambulatory Visit: Payer: Self-pay

## 2017-05-17 DIAGNOSIS — O480 Post-term pregnancy: Secondary | ICD-10-CM

## 2017-05-17 LAB — STOOL CULTURE

## 2017-05-17 LAB — SPECIMEN STATUS REPORT

## 2017-05-17 LAB — OVA AND PARASITE EXAMINATION

## 2017-05-17 NOTE — Progress Notes (Signed)
Pt informed that the ultrasound is considered a limited OB ultrasound and is not intended to be a complete ultrasound exam.  Patient also informed that the ultrasound is not being completed with the intent of assessing for fetal or placental anomalies or any pelvic abnormalities.  Explained that the purpose of today's ultrasound is to assess for presentation and amniotic fluid volume.  Patient acknowledges the purpose of the exam and the limitations of the study.    IOL scheduled 7/9 @ 0700.  Labor sx reviewed.

## 2017-05-17 NOTE — Progress Notes (Signed)
7/5 NST reviewed and reactive 

## 2017-05-18 ENCOUNTER — Inpatient Hospital Stay (HOSPITAL_COMMUNITY)
Admission: AD | Admit: 2017-05-18 | Discharge: 2017-05-18 | Disposition: A | Discharge status: Home / Self Care | Payer: Self-pay | Source: Ambulatory Visit | Attending: Obstetrics and Gynecology | Admitting: Obstetrics and Gynecology

## 2017-05-18 DIAGNOSIS — O09529 Supervision of elderly multigravida, unspecified trimester: Secondary | ICD-10-CM | POA: Insufficient documentation

## 2017-05-18 DIAGNOSIS — Z3A00 Weeks of gestation of pregnancy not specified: Secondary | ICD-10-CM | POA: Insufficient documentation

## 2017-05-18 DIAGNOSIS — O479 False labor, unspecified: Secondary | ICD-10-CM

## 2017-05-18 NOTE — MAU Note (Signed)
Back to use restroom 

## 2017-05-18 NOTE — MAU Note (Signed)
I have communicated with Dr. Mosetta PuttFeng and reviewed vital signs:  Vitals:   05/18/17 1915  BP: (!) 92/54  Pulse: 76  Resp: 18  Temp: 98.3 F (36.8 C)    Vaginal exam:  Dilation: 1 Effacement (%): Thick Cervical Position: Posterior Exam by:: Mosetta PuttFeng, MD ,   Also reviewed contraction pattern and that non-stress test is reactive.  It has been documented that patient is contracting every 2-8 minutes with minimal  cervical change over 2 hours not indicating active labor.  Patient denies any other complaints.  Based on this report provider has given order for discharge.  A discharge order and diagnosis entered by a provider.   Labor discharge instructions reviewed with patient.

## 2017-05-18 NOTE — MAU Note (Addendum)
Pt. Started contracting this morning around 0600.  Contractions are getting stronger... that is why patient is here, contractions were 10 minutes apart at home. Pt. Denies sudden gush of fluid, bloody show present, and positive for fetal movement. EFM applied FHR-120s, toco applied - abd. Soft.

## 2017-05-19 ENCOUNTER — Inpatient Hospital Stay (HOSPITAL_COMMUNITY)
Admission: AD | Admit: 2017-05-19 | Discharge: 2017-05-19 | Disposition: A | Discharge status: Home / Self Care | Payer: Self-pay | Source: Ambulatory Visit | Attending: Obstetrics and Gynecology | Admitting: Obstetrics and Gynecology

## 2017-05-19 ENCOUNTER — Encounter (HOSPITAL_COMMUNITY): Payer: Self-pay | Admitting: *Deleted

## 2017-05-19 DIAGNOSIS — Z3A41 41 weeks gestation of pregnancy: Secondary | ICD-10-CM | POA: Insufficient documentation

## 2017-05-19 DIAGNOSIS — O471 False labor at or after 37 completed weeks of gestation: Secondary | ICD-10-CM | POA: Insufficient documentation

## 2017-05-19 DIAGNOSIS — O479 False labor, unspecified: Secondary | ICD-10-CM

## 2017-05-19 MED ORDER — ALUM & MAG HYDROXIDE-SIMETH 200-200-20 MG/5ML PO SUSP
30.0000 mL | Freq: Once | ORAL | Status: AC
  Administered 2017-05-19: 30 mL via ORAL
  Filled 2017-05-19: qty 30

## 2017-05-19 MED ORDER — MORPHINE SULFATE (PF) 10 MG/ML IV SOLN
10.0000 mg | Freq: Once | INTRAVENOUS | Status: AC
  Administered 2017-05-19: 10 mg via INTRAMUSCULAR

## 2017-05-19 MED ORDER — MORPHINE SULFATE (PF) 10 MG/ML IV SOLN
10.0000 mg | Freq: Once | INTRAVENOUS | Status: DC
  Filled 2017-05-19: qty 1

## 2017-05-19 NOTE — Discharge Instructions (Signed)
Contracciones de Braxton Hicks °(Braxton Hicks Contractions) °Durante el embarazo, pueden presentarse contracciones uterinas que no siempre indican que está en trabajo de parto. °¿QUÉ SON LAS CONTRACCIONES DE BRAXTON HICKS? °Las contracciones que se presentan antes del trabajo de parto se conocen como contracciones de Braxton Hicks o falso trabajo de parto. Hacia el final del embarazo (32 a 34 semanas), estas contracciones pueden aparecen con más frecuencia y volverse más intensas. No corresponden al trabajo de parto verdadero porque estas contracciones no producen el agrandamiento (la dilatación) y el afinamiento del cuello del útero. Algunas veces, es difícil distinguirlas del trabajo de parto verdadero porque en algunos casos pueden ser muy intensas, y las personas tienen diferentes niveles de tolerancia al dolor. No debe sentirse avergonzada si concurre al hospital con falso trabajo de parto. En ocasiones, la única forma de saber si el trabajo de parto es verdadero es que el médico determine si hay cambios en el cuello del útero. °Si no hay problemas prenatales u otras complicaciones de salud asociadas con el embarazo, no habrá inconvenientes si la envían a su casa con falso trabajo de parto y espera que comience el verdadero. °CÓMO DIFERENCIAR EL TRABAJO DE PARTO FALSO DEL VERDADERO °Falso trabajo de parto  °· Las contracciones del falso trabajo de parto duran menos y no son tan intensas como las verdaderas. °· Generalmente son irregulares. °· A menudo, se sienten en la parte delantera de la parte baja del abdomen y en la ingle, °· y pueden desaparecer cuando camina o cambia de posición mientras está acostada. °· Las contracciones se vuelven más débiles y su duración es menor a medida que el tiempo transcurre. °· Por lo general, no se hacen progresivamente más intensas, regulares y cercanas entre sí como en el caso del trabajo de parto verdadero. °Verdadero trabajo de parto  °· Las contracciones del verdadero  trabajo de parto duran de 30 a 70 segundos, son muy regulares y suelen volverse más intensas, y aumenta su frecuencia. °· No desaparecen cuando camina. °· La molestia generalmente se siente en la parte superior del útero y se extiende hacia la zona inferior del abdomen y hacia la cintura. °· El médico podrá examinarla para determinar si el trabajo de parto es verdadero. El examen mostrará si el cuello del útero se está dilatando y afinando. °LO QUE DEBE RECORDAR °· Continúe haciendo los ejercicios habituales y siga otras indicaciones que el médico le dé. °· Tome todos los medicamentos como le indicó el médico. °· Concurra a las visitas prenatales regulares. °· Coma y beba con moderación si cree que está en trabajo de parto. °· Si las contracciones de Braxton Hicks le provocan incomodidad: °¨ Cambie de posición: si está acostada o descansando, camine; si está caminando, descanse. °¨ Siéntese y descanse en una bañera con agua tibia. °¨ Beba 2 o 3 vasos de agua. La deshidratación puede provocar contracciones. °¨ Respire lenta y profundamente varias veces por hora. °¿CUÁNDO DEBO BUSCAR ASISTENCIA MÉDICA INMEDIATA? °Solicite atención médica de inmediato si: °· Las contracciones se intensifican, se hacen más regulares y cercanas entre sí. °· Tiene una pérdida de líquido por la vagina. °· Tiene fiebre. °· Elimina mucosidad manchada con sangre. °· Tiene una hemorragia vaginal abundante. °· Tiene dolor abdominal permanente. °· Tiene un dolor en la zona lumbar que nunca tuvo antes. °· Siente que la cabeza del bebé empuja hacia abajo y ejerce presión en la zona pélvica. °· El bebé no se mueve tanto como solía. °Esta información no tiene como fin reemplazar el   consejo del médico. Asegúrese de hacerle al médico cualquier pregunta que tenga. °Document Released: 08/09/2005 Document Revised: 02/21/2016 Document Reviewed: 08/11/2013 °Elsevier Interactive Patient Education © 2017 Elsevier Inc. ° °

## 2017-05-19 NOTE — MAU Provider Note (Signed)
Patient seen for labor evaluation NST reactive 130/mod var/reactive. Contraction about every 10 minutes.  Will give IM morphine.  Ok to d/c home  Ernestina PennaNicholas Hilja Kintzel, MD 05/19/17 3:06 PM

## 2017-05-19 NOTE — MAU Note (Signed)
Pt presents with complaint of worsening contractions.  

## 2017-05-19 NOTE — MAU Note (Signed)
Initially Belinda Mcintosh, Belinda Mcintosh's husband, and Belinda Mcintosh's sister concerned about need for C/S since she is not progressing. Expressing concerns about her level of pain and whether or not the baby is okay. Dr Genevie AnnSchenk in to speak with the patient and family.

## 2017-05-20 ENCOUNTER — Inpatient Hospital Stay (HOSPITAL_COMMUNITY): Payer: Medicaid Other | Admitting: Anesthesiology

## 2017-05-20 ENCOUNTER — Encounter (HOSPITAL_COMMUNITY): Payer: Self-pay

## 2017-05-20 ENCOUNTER — Inpatient Hospital Stay (HOSPITAL_COMMUNITY)
Admission: AD | Admit: 2017-05-20 | Discharge: 2017-05-23 | DRG: 774 | Disposition: A | Discharge status: Home / Self Care | Payer: Medicaid Other | Source: Ambulatory Visit | Attending: Obstetrics & Gynecology | Admitting: Obstetrics & Gynecology

## 2017-05-20 DIAGNOSIS — O9832 Other infections with a predominantly sexual mode of transmission complicating childbirth: Secondary | ICD-10-CM | POA: Diagnosis present

## 2017-05-20 DIAGNOSIS — D696 Thrombocytopenia, unspecified: Secondary | ICD-10-CM | POA: Diagnosis present

## 2017-05-20 DIAGNOSIS — A630 Anogenital (venereal) warts: Secondary | ICD-10-CM | POA: Diagnosis present

## 2017-05-20 DIAGNOSIS — O9912 Other diseases of the blood and blood-forming organs and certain disorders involving the immune mechanism complicating childbirth: Secondary | ICD-10-CM | POA: Diagnosis present

## 2017-05-20 DIAGNOSIS — Z3493 Encounter for supervision of normal pregnancy, unspecified, third trimester: Secondary | ICD-10-CM | POA: Diagnosis present

## 2017-05-20 DIAGNOSIS — Z3A41 41 weeks gestation of pregnancy: Secondary | ICD-10-CM

## 2017-05-20 DIAGNOSIS — Z87891 Personal history of nicotine dependence: Secondary | ICD-10-CM | POA: Diagnosis not present

## 2017-05-20 DIAGNOSIS — O480 Post-term pregnancy: Secondary | ICD-10-CM | POA: Diagnosis not present

## 2017-05-20 LAB — TYPE AND SCREEN
ABO/RH(D): O POS
Antibody Screen: NEGATIVE

## 2017-05-20 LAB — CBC
HCT: 37 % (ref 36.0–46.0)
HEMOGLOBIN: 12.5 g/dL (ref 12.0–15.0)
MCH: 30 pg (ref 26.0–34.0)
MCHC: 33.8 g/dL (ref 30.0–36.0)
MCV: 88.9 fL (ref 78.0–100.0)
Platelets: 125 10*3/uL — ABNORMAL LOW (ref 150–400)
RBC: 4.16 MIL/uL (ref 3.87–5.11)
RDW: 15.3 % (ref 11.5–15.5)
WBC: 9.7 10*3/uL (ref 4.0–10.5)

## 2017-05-20 LAB — RPR: RPR: NONREACTIVE

## 2017-05-20 LAB — ABO/RH: ABO/RH(D): O POS

## 2017-05-20 MED ORDER — OXYTOCIN 40 UNITS IN LACTATED RINGERS INFUSION - SIMPLE MED
2.5000 [IU]/h | INTRAVENOUS | Status: DC
  Administered 2017-05-21: 2.5 [IU]/h via INTRAVENOUS
  Filled 2017-05-20: qty 1000

## 2017-05-20 MED ORDER — PHENYLEPHRINE 40 MCG/ML (10ML) SYRINGE FOR IV PUSH (FOR BLOOD PRESSURE SUPPORT): 80.0000 ug | PREFILLED_SYRINGE | INTRAVENOUS | Status: DC | PRN

## 2017-05-20 MED ORDER — CALCIUM CARBONATE ANTACID 500 MG PO CHEW: 400.0000 mg | CHEWABLE_TABLET | ORAL | Status: DC | PRN

## 2017-05-20 MED ORDER — LACTATED RINGERS IV SOLN
INTRAVENOUS | Status: DC
  Administered 2017-05-20 (×3): via INTRAVENOUS

## 2017-05-20 MED ORDER — OXYTOCIN 40 UNITS IN LACTATED RINGERS INFUSION - SIMPLE MED
1.0000 m[IU]/min | INTRAVENOUS | Status: DC
  Administered 2017-05-20: 2 m[IU]/min via INTRAVENOUS

## 2017-05-20 MED ORDER — ONDANSETRON HCL 4 MG/2ML IJ SOLN: 4.0000 mg | Freq: Four times a day (QID) | INTRAMUSCULAR | Status: DC | PRN

## 2017-05-20 MED ORDER — LACTATED RINGERS IV SOLN
500.0000 mL | INTRAVENOUS | Status: DC | PRN
  Administered 2017-05-20: 500 mL via INTRAVENOUS

## 2017-05-20 MED ORDER — SOD CITRATE-CITRIC ACID 500-334 MG/5ML PO SOLN: 30.0000 mL | ORAL | Status: DC | PRN

## 2017-05-20 MED ORDER — EPHEDRINE 5 MG/ML INJ
10.0000 mg | INTRAVENOUS | Status: DC | PRN
  Filled 2017-05-20: qty 2

## 2017-05-20 MED ORDER — PHENYLEPHRINE 40 MCG/ML (10ML) SYRINGE FOR IV PUSH (FOR BLOOD PRESSURE SUPPORT)
PREFILLED_SYRINGE | INTRAVENOUS | Status: AC
  Filled 2017-05-20: qty 20

## 2017-05-20 MED ORDER — OXYTOCIN BOLUS FROM INFUSION: 500.0000 mL | Freq: Once | INTRAVENOUS | Status: DC

## 2017-05-20 MED ORDER — OXYCODONE-ACETAMINOPHEN 5-325 MG PO TABS: 2.0000 | ORAL_TABLET | ORAL | Status: DC | PRN

## 2017-05-20 MED ORDER — FENTANYL 2.5 MCG/ML BUPIVACAINE 1/10 % EPIDURAL INFUSION (WH - ANES)
14.0000 mL/h | INTRAMUSCULAR | Status: DC | PRN
  Administered 2017-05-20 (×2): 14 mL/h via EPIDURAL
  Filled 2017-05-20 (×2): qty 100

## 2017-05-20 MED ORDER — LIDOCAINE HCL (PF) 1 % IJ SOLN: 30.0000 mL | INTRAMUSCULAR | Status: DC | PRN

## 2017-05-20 MED ORDER — LACTATED RINGERS IV SOLN
INTRAVENOUS | Status: DC
  Administered 2017-05-20: 05:00:00 via INTRAVENOUS

## 2017-05-20 MED ORDER — TERBUTALINE SULFATE 1 MG/ML IJ SOLN
0.2500 mg | Freq: Once | INTRAMUSCULAR | Status: DC | PRN
  Filled 2017-05-20: qty 1

## 2017-05-20 MED ORDER — OXYTOCIN BOLUS FROM INFUSION
500.0000 mL | Freq: Once | INTRAVENOUS | Status: AC
  Administered 2017-05-21: 500 mL via INTRAVENOUS

## 2017-05-20 MED ORDER — FENTANYL 2.5 MCG/ML BUPIVACAINE 1/10 % EPIDURAL INFUSION (WH - ANES)
INTRAMUSCULAR | Status: AC
  Filled 2017-05-20: qty 100

## 2017-05-20 MED ORDER — LIDOCAINE HCL (PF) 1 % IJ SOLN
30.0000 mL | INTRAMUSCULAR | Status: DC | PRN
  Filled 2017-05-20: qty 30

## 2017-05-20 MED ORDER — EPHEDRINE 5 MG/ML INJ: 10.0000 mg | INTRAVENOUS | Status: DC | PRN

## 2017-05-20 MED ORDER — OXYTOCIN 40 UNITS IN LACTATED RINGERS INFUSION - SIMPLE MED: 2.5000 [IU]/h | INTRAVENOUS | Status: DC

## 2017-05-20 MED ORDER — DIPHENHYDRAMINE HCL 50 MG/ML IJ SOLN: 12.5000 mg | INTRAMUSCULAR | Status: DC | PRN

## 2017-05-20 MED ORDER — ACETAMINOPHEN 325 MG PO TABS: 650.0000 mg | ORAL_TABLET | ORAL | Status: DC | PRN

## 2017-05-20 MED ORDER — RANITIDINE HCL 150 MG/10ML PO SYRP: 150.0000 mg | ORAL_SOLUTION | Freq: Two times a day (BID) | ORAL | Status: DC

## 2017-05-20 MED ORDER — FENTANYL CITRATE (PF) 100 MCG/2ML IJ SOLN: 100.0000 ug | INTRAMUSCULAR | Status: DC | PRN

## 2017-05-20 MED ORDER — LACTATED RINGERS IV SOLN: 500.0000 mL | Freq: Once | INTRAVENOUS | Status: DC

## 2017-05-20 MED ORDER — PHENYLEPHRINE 40 MCG/ML (10ML) SYRINGE FOR IV PUSH (FOR BLOOD PRESSURE SUPPORT)
80.0000 ug | PREFILLED_SYRINGE | INTRAVENOUS | Status: DC | PRN
  Filled 2017-05-20: qty 5

## 2017-05-20 MED ORDER — LIDOCAINE HCL (PF) 1 % IJ SOLN
INTRAMUSCULAR | Status: DC | PRN
  Administered 2017-05-20 (×2): 5 mL via EPIDURAL

## 2017-05-20 MED ORDER — DIPHENHYDRAMINE HCL 50 MG/ML IJ SOLN
12.5000 mg | INTRAMUSCULAR | Status: DC | PRN
  Administered 2017-05-20: 12.5 mg via INTRAVENOUS
  Filled 2017-05-20: qty 1

## 2017-05-20 MED ORDER — SODIUM BICARBONATE 8.4 % IV SOLN
INTRAVENOUS | Status: DC | PRN
  Administered 2017-05-20: 3 mL via EPIDURAL

## 2017-05-20 MED ORDER — FAMOTIDINE 20 MG PO TABS
20.0000 mg | ORAL_TABLET | Freq: Two times a day (BID) | ORAL | Status: DC
  Administered 2017-05-20: 20 mg via ORAL
  Filled 2017-05-20: qty 1

## 2017-05-20 MED ORDER — FENTANYL 2.5 MCG/ML BUPIVACAINE 1/10 % EPIDURAL INFUSION (WH - ANES)
INTRAMUSCULAR | Status: DC | PRN
  Administered 2017-05-20: 14 mL/h via EPIDURAL

## 2017-05-20 MED ORDER — OXYCODONE-ACETAMINOPHEN 5-325 MG PO TABS: 1.0000 | ORAL_TABLET | ORAL | Status: DC | PRN

## 2017-05-20 NOTE — Progress Notes (Addendum)
Labor Progress Note Belinda Mcintosh is a 36 y.o. G2P0010 at 6817w0d presented for SOL  S:  Comfortable with epidural, left lateral with peanut ball.  O:  BP 120/74   Pulse 94   Temp 99.3 F (37.4 C) (Oral)   Resp 16   Ht 5\' 1"  (1.549 m)   Wt 73.9 kg (163 lb)   LMP 08/23/2016 (Within Weeks)   SpO2 99%   BMI 30.80 kg/m  EFM: baseline 140 bpm/ mod variability/ no accels/ early decels  Toco: 1-2 SVE: Dilation: Lip/rim Effacement (%): 100 Station: +1 Presentation: Vertex Exam by:: Catie Futures traderullivan RN Pitocin: 14 mu/min MVU: 110  A/P: 36 y.o. G2P0010 2717w0d  1. Labor: active 2. FWB: Cat I 3. Pain: epidural Continue Pitocin titration for adequate labor. Anticipate labor progression, descent, and SVD.  Donette LarryMelanie Rayyan Burley, CNM 9:22 PM

## 2017-05-20 NOTE — MAU Note (Signed)
Patient presents with reports of no fetal movement for 20 minutes and vaginal bleeding.  Reports no LOF and is having irregular contractions.

## 2017-05-20 NOTE — Progress Notes (Signed)
Patient ID: Belinda Mcintosh, female   DOB: 09/17/1981, 36 y.o.   MRN: [id number] Labor Progress Note  S: Patient seen & examined for progress of labor. Patient comfortable with epidural.  O: BP 117/62   Pulse 87   Temp 98.3 F (36.8 C) (Oral)   Resp 18   Ht 5\' 1"  (1.549 m)   Wt 73.9 kg (163 lb)   LMP 08/23/2016 (Within Weeks)   SpO2 96%   BMI 30.80 kg/m   FHT: 130bpm, mod var, +accels, no decels TOCO: q2-5 min, patient looks comfortable during contractions  CVE: Dilation: 5 Effacement (%): 70 Station: -2 Presentation: Vertex Exam by:: dr Talbert Forestshirley do   A&P: [age] y.o. G2P0010 5327w0d admitted for SOL.   AROM @1015  with moderate amount of meconium.  IUPC inserted @1015  Currently on 4 milli-units/min Continue expectant management Anticipate SVD  [place] Shirley, DO [facility] Resident PGY-1 05/20/2017 10:26 AM

## 2017-05-20 NOTE — Anesthesia Preprocedure Evaluation (Signed)
Anesthesia Evaluation  Patient identified by MRN, date of birth, ID band Patient awake    Reviewed: Allergy & Precautions, H&P , NPO status , Patient's Chart, lab work & pertinent test results  Airway Mallampati: II   Neck ROM: full    Dental   Pulmonary former smoker,    breath sounds clear to auscultation       Cardiovascular negative cardio ROS   Rhythm:regular Rate:Normal     Neuro/Psych    GI/Hepatic   Endo/Other    Renal/GU      Musculoskeletal   Abdominal   Peds  Hematology   Anesthesia Other Findings   Reproductive/Obstetrics (+) Pregnancy                             Anesthesia Physical Anesthesia Plan  ASA: I  Anesthesia Plan: Epidural   Post-op Pain Management:    Induction: Intravenous  PONV Risk Score and Plan: 2 and Treatment may vary due to age or medical condition  Airway Management Planned: Natural Airway  Additional Equipment:   Intra-op Plan:   Post-operative Plan:   Informed Consent: I have reviewed the patients History and Physical, chart, labs and discussed the procedure including the risks, benefits and alternatives for the proposed anesthesia with the patient or authorized representative who has indicated his/her understanding and acceptance.       Plan Discussed with: Anesthesiologist  Anesthesia Plan Comments:         Anesthesia Quick Evaluation  

## 2017-05-20 NOTE — Progress Notes (Signed)
Patient ID: Redmond Basemanva Vega-Torres, female   DOB: 04/13/1981, 36 y.o.   MRN: [id number] Labor Progress Note  S: Patient seen & examined for progress of labor. Patient is very uncomfortable despite epidural. Anesthesia came to room to evaluate.   O: BP 119/73   Pulse 86   Temp 98.1 F (36.7 C) (Oral)   Resp 18   Ht 5\' 1"  (1.549 m)   Wt 73.9 kg (163 lb)   LMP 08/23/2016 (Within Weeks)   SpO2 99%   BMI 30.80 kg/m   FHT: 140 bpm, mod var, +accels, no decels TOCO: q1-753min, patient looks comfortable during contractions  CVE: Dilation: 9 Effacement (%): 90 Station: +1 Presentation: Vertex Exam by:: patti moore rn   A&P: 36 y.o. G2P0010 985w0d SOL   Currently on 10 milli-units of pitocin, AROM @1015  Continue expectant management Anticipate SVD  [place] Shirley, DO FM Resident PGY-1 05/20/2017 6:49 PM

## 2017-05-20 NOTE — H&P (Signed)
LABOR AND DELIVERY ADMISSION HISTORY AND PHYSICAL NOTE  Belinda Mcintosh is a 36 y.o. female G2P0010 with IUP at 6372w0d by LMP presenting for SOL. She reports +FM, + contractions, No LOF, no VB, no blurry vision, headaches or peripheral edema, and RUQ pain.  She request depo for birth control.  Prenatal History/Complications:  Past Medical History: Past Medical History:  Diagnosis Date  . Umbilical hernia 2018    Past Surgical History: Past Surgical History:  Procedure Laterality Date  . INDUCED ABORTION    . LIPOSUCTION    . NOSE SURGERY      Obstetrical History: OB History    Gravida Para Term Preterm AB Living   2 0 0 0 1     SAB TAB Ectopic Multiple Live Births   0 1 0          Social History: Social History   Social History  . Marital status: Married    Spouse name: N/A  . Number of children: N/A  . Years of education: N/A   Social History Main Topics  . Smoking status: Former Smoker    Packs/day: 1.00    Types: Cigarettes    Quit date: 09/21/2016  . Smokeless tobacco: Never Used  . Alcohol use No  . Drug use: No  . Sexual activity: Yes   Other Topics Concern  . None   Social History Narrative   ** Merged History Encounter **        Family History: Family History  Problem Relation Age of Onset  . Cancer Mother        Breast Cancer  . Hypertension Mother   . Diabetes Mother   . Cancer Father   . Hypertension Father   . Diabetes Father   . Alcohol abuse Neg Hx   . Arthritis Neg Hx   . Asthma Neg Hx   . Birth defects Neg Hx   . COPD Neg Hx   . Depression Neg Hx   . Drug abuse Neg Hx   . Early death Neg Hx   . Heart disease Neg Hx   . Hearing loss Neg Hx   . Hyperlipidemia Neg Hx   . Kidney disease Neg Hx   . Learning disabilities Neg Hx   . Mental illness Neg Hx   . Mental retardation Neg Hx   . Miscarriages / Stillbirths Neg Hx   . Stroke Neg Hx   . Vision loss Neg Hx   . Varicose Veins Neg Hx     Allergies: Allergies  Allergen  Reactions  . Shrimp [Shellfish Allergy] Itching    Prescriptions Prior to Admission  Medication Sig Dispense Refill Last Dose  . docusate sodium (COLACE) 100 MG capsule Take 1 capsule (100 mg total) by mouth 2 (two) times daily. 60 capsule 0 05/19/2017  . pantoprazole (PROTONIX) 20 MG tablet TK 1 T PO ONCE D  3 05/19/2017  . Prenatal Vit-Fe Fumarate-FA (PRENATAL VITAMIN) 27-0.8 MG TABS Take 1 tablet by mouth daily.   05/19/2017  . promethazine (PHENERGAN) 12.5 MG tablet TK 1 T PO Q 6 H PRN NV  0 05/19/2017     Review of Systems   All systems reviewed and negative except as stated in HPI  Physical Exam Blood pressure 111/65, pulse 86, temperature 98.4 F (36.9 C), temperature source Oral, resp. rate 17, last menstrual period 08/23/2016, SpO2 100 %, unknown if currently breastfeeding. General appearance: alert, cooperative and appears stated age  Presentation: cephalic Fetal monitoring: 135  bmp, mod var, accels, early decel Uterine activity: q58m Dilation: 5 Effacement (%): 100 Station: -1 Exam by:: Latricia Heft, RN   Prenatal labs: ABO, Rh: --/--/O POS (07/08 0455) Antibody: PENDING (07/08 0455) Rubella: immune RPR: Non Reactive (04/24 0824)  HBsAg: NEGATIVE (01/09 1353)  HIV: Non Reactive (04/24 0824)  GBS: Negative (06/04 1637)  1 hr Glucola: normal Genetic screening:  negative Anatomy US: normal  Prenatal Transfer Tool  Maternal Diabetes: No Genetic Screening: Normal Maternal Ultrasounds/Referrals: Normal Fetal Ultrasounds or other Referrals:  None Maternal Substance Abuse:  No Significant Maternal Medications:  None Significant Maternal Lab Results: Lab values include: Group B Strep negative  Results for orders placed or performed during the hospital encounter of 05/20/17 (from the past 24 hour(s))  CBC   Collection Time: 05/20/17  4:55 AM  Result Value Ref Range   WBC 9.7 4.0 - 10.5 K/uL   RBC 4.16 3.87 - 5.11 MIL/uL   Hemoglobin 12.5 12.0 - 15.0 g/dL   HCT 16.1  09.6 - 04.5 %   MCV 88.9 78.0 - 100.0 fL   MCH 30.0 26.0 - 34.0 pg   MCHC 33.8 30.0 - 36.0 g/dL   RDW 40.9 81.1 - 91.4 %   Platelets 125 (L) 150 - 400 K/uL  Type and screen [facility]   Collection Time: 05/20/17  4:55 AM  Result Value Ref Range   ABO/RH(D) O POS    Antibody Screen PENDING    Sample Expiration 05/23/2017     Patient Active Problem List   Diagnosis Date Noted  . Normal labor and delivery 05/20/2017  . AMA (advanced maternal age) multigravida 35+ 05/14/2017  . Throat discomfort 05/07/2017  . Genital warts complicating pregnancy, third trimester 04/30/2017  . Thrombocytopenia during pregnancy (HCC) 03/07/2017  . Umbilical hernia 02/07/2017  . Encounter for supervision of normal first pregnancy in third trimester 11/21/2016    Assessment: Belinda Mcintosh is a 36 y.o. G2P0010 at [redacted]w[redacted]d here for SOL  #Labor:progressing normally #Pain: Planning epidural #FWB: Category I tracing #ID:  GBS negative #MOF: breast #MOC:Depo #Circ:  N/A  Howard Pouch, MD PGY-2 [facility] 05/20/2017, 5:26 AM  OB FELLOW HISTORY AND PHYSICAL ATTESTATION  I confirm that I have verified the information documented in the resident's note and that I have also personally reperformed the physical exam and all medical decision making activities.      Ernestina Penna 05/20/2017, 7:49 AM

## 2017-05-20 NOTE — Progress Notes (Signed)
Patient ID: Belinda Mcintosh, female   DOB: 03/14/1981, 36 y.o.   MRN: [id number] Labor Progress Note  S: Patient seen & examined for progress of labor. Patient comfortable with epidural. Asking for something for heartburn. She is concerned about the meconium stained fluid, patient was reassured.   O: BP (!) 94/56   Pulse 73   Temp 97.8 F (36.6 C) (Oral)   Resp 18   Ht 5\' 1"  (1.549 m)   Wt 73.9 kg (163 lb)   LMP 08/23/2016 (Within Weeks)   SpO2 96%   BMI 30.80 kg/m    Blood Pressure cuff tightened, patient does not show any signs or symtpoms of hypotension.   FHT: 125 bpm, mod var, +accels, no decels TOCO: q1-3 min, patient looks comfortable during contractions  CVE: Dilation: 5 Effacement (%): 70 Station: -2 Presentation: Vertex Exam by:: dr Talbert Forestshirley do   A&P: 36 y.o. G2P0010 153w0d SOL  Currently on 8 milli-units/min pitocin, AROM at 1015 with mod mec Monitor Blood Pressure Continue expectant management Anticipate SVD  [place] Shirley, DO FM Resident PGY-1 05/20/2017 1:13 PM

## 2017-05-20 NOTE — Anesthesia Procedure Notes (Addendum)
Epidural Patient location during procedure: OB Start time: 05/20/2017 5:35 AM End time: 05/20/2017 5:46 AM  Staffing Anesthesiologist: Chaney MallingHODIERNE, Lalita Ebel Performed: anesthesiologist   Preanesthetic Checklist Completed: patient identified, site marked, pre-op evaluation, timeout performed, IV checked, risks and benefits discussed and monitors and equipment checked  Epidural Patient position: sitting Prep: ChloraPrep Patient monitoring: heart rate, cardiac monitor, continuous pulse ox and blood pressure Approach: midline Location: L2-L3 Injection technique: LOR saline  Needle:  Needle type: Tuohy  Needle gauge: 17 G Needle length: 9 cm Needle insertion depth: 6 cm Catheter type: closed end flexible Catheter size: 19 Gauge Catheter at skin depth: 12 cm Test dose: negative and Other  Assessment Events: blood not aspirated, injection not painful, no injection resistance and negative IV test  Additional Notes Informed consent obtained prior to proceeding including risk of failure, 1% risk of PDPH, risk of minor discomfort and bruising.  Discussed rare but serious complications including epidural abscess, permanent nerve injury, epidural hematoma.  Discussed alternatives to epidural analgesia and patient desires to proceed.  Timeout performed pre-procedure verifying patient name, procedure, and platelet count.  Patient tolerated procedure well. Reason for block:procedure for pain

## 2017-05-20 NOTE — Progress Notes (Signed)
Patient ID: Belinda Mcintosh, female   DOB: 04/12/1981, 36 y.o.   MRN: [id number] Labor Progress Note  S: Patient seen & examined for progress of labor. Patient comfortable with epidural. Patient is impatient with change of cervix and would like for labor to be happening faster. Explained that this takes time, especially since this is her first.  O: BP (!) 94/56   Pulse 73   Temp 97.8 F (36.6 C) (Oral)   Resp 18   Ht 5\' 1"  (1.549 m)   Wt 73.9 kg (163 lb)   LMP 08/23/2016 (Within Weeks)   SpO2 99%   BMI 30.80 kg/m   FHT: 125bpm, mod var, +accels, no decels TOCO: q1-3 min, patient looks comfortable during contractions  CVE: Dilation: 6 Effacement (%): 60 Station: -1 Presentation: Vertex Exam by:: dr Talbert Forestshirley do   A&P: 36 y.o. G2P0010 6547w0d for SOL   Currently on 10 milli-units/min pitocin AROM @1015  with mod. mec Continue expectant management Anticipate SVD  [place] Shirley, DO FM Resident PGY-1 05/20/2017 3:14 PM

## 2017-05-21 ENCOUNTER — Inpatient Hospital Stay (HOSPITAL_COMMUNITY): Admission: RE | Admit: 2017-05-21 | Payer: Self-pay | Source: Ambulatory Visit

## 2017-05-21 ENCOUNTER — Encounter (HOSPITAL_COMMUNITY): Payer: Self-pay | Admitting: *Deleted

## 2017-05-21 DIAGNOSIS — Z3A41 41 weeks gestation of pregnancy: Secondary | ICD-10-CM

## 2017-05-21 DIAGNOSIS — O480 Post-term pregnancy: Secondary | ICD-10-CM

## 2017-05-21 MED ORDER — ONDANSETRON HCL 4 MG/2ML IJ SOLN: 4.0000 mg | INTRAMUSCULAR | Status: DC | PRN

## 2017-05-21 MED ORDER — WITCH HAZEL-GLYCERIN EX PADS: 1.0000 "application " | MEDICATED_PAD | CUTANEOUS | Status: DC | PRN

## 2017-05-21 MED ORDER — SIMETHICONE 80 MG PO CHEW: 80.0000 mg | CHEWABLE_TABLET | ORAL | Status: DC | PRN

## 2017-05-21 MED ORDER — MISOPROSTOL 200 MCG PO TABS
ORAL_TABLET | ORAL | Status: AC
  Administered 2017-05-21: 800 ug
  Filled 2017-05-21: qty 4

## 2017-05-21 MED ORDER — ACETAMINOPHEN 325 MG PO TABS
650.0000 mg | ORAL_TABLET | ORAL | Status: DC | PRN
  Administered 2017-05-21 – 2017-05-22 (×3): 650 mg via ORAL
  Filled 2017-05-21 (×3): qty 2

## 2017-05-21 MED ORDER — COCONUT OIL OIL
1.0000 "application " | TOPICAL_OIL | Status: DC | PRN
  Administered 2017-05-22: 1 via TOPICAL
  Filled 2017-05-21 (×2): qty 120

## 2017-05-21 MED ORDER — MINERAL OIL PO OIL
TOPICAL_OIL | Freq: Once | ORAL | Status: AC
  Administered 2017-05-21: 02:00:00
  Filled 2017-05-21: qty 473

## 2017-05-21 MED ORDER — SENNOSIDES-DOCUSATE SODIUM 8.6-50 MG PO TABS
2.0000 | ORAL_TABLET | ORAL | Status: DC
  Administered 2017-05-21 – 2017-05-23 (×2): 2 via ORAL
  Filled 2017-05-21: qty 2

## 2017-05-21 MED ORDER — DIPHENHYDRAMINE HCL 25 MG PO CAPS: 25.0000 mg | ORAL_CAPSULE | Freq: Four times a day (QID) | ORAL | Status: DC | PRN

## 2017-05-21 MED ORDER — PRENATAL MULTIVITAMIN CH
1.0000 | ORAL_TABLET | Freq: Every day | ORAL | Status: DC
  Administered 2017-05-22 – 2017-05-23 (×2): 1 via ORAL
  Filled 2017-05-21 (×2): qty 1

## 2017-05-21 MED ORDER — BENZOCAINE-MENTHOL 20-0.5 % EX AERO
1.0000 "application " | INHALATION_SPRAY | CUTANEOUS | Status: DC | PRN
  Administered 2017-05-21 – 2017-05-23 (×2): 1 via TOPICAL
  Filled 2017-05-21 (×2): qty 56

## 2017-05-21 MED ORDER — OXYCODONE HCL 5 MG PO TABS
5.0000 mg | ORAL_TABLET | Freq: Four times a day (QID) | ORAL | Status: DC | PRN
  Administered 2017-05-21 – 2017-05-22 (×2): 5 mg via ORAL
  Filled 2017-05-21 (×2): qty 1

## 2017-05-21 MED ORDER — METHYLERGONOVINE MALEATE 0.2 MG/ML IJ SOLN
0.2000 mg | Freq: Once | INTRAMUSCULAR | Status: AC
  Administered 2017-05-21: 0.2 mg via INTRAMUSCULAR

## 2017-05-21 MED ORDER — DIBUCAINE 1 % RE OINT
1.0000 "application " | TOPICAL_OINTMENT | RECTAL | Status: DC | PRN
  Administered 2017-05-23: 1 via RECTAL
  Filled 2017-05-21: qty 28

## 2017-05-21 MED ORDER — ONDANSETRON HCL 4 MG PO TABS: 4.0000 mg | ORAL_TABLET | ORAL | Status: DC | PRN

## 2017-05-21 MED ORDER — IBUPROFEN 600 MG PO TABS
600.0000 mg | ORAL_TABLET | Freq: Four times a day (QID) | ORAL | Status: DC
  Administered 2017-05-21 – 2017-05-23 (×10): 600 mg via ORAL
  Filled 2017-05-21 (×10): qty 1

## 2017-05-21 MED ORDER — MINERAL OIL PO OIL: TOPICAL_OIL | Freq: Once | ORAL | Status: DC

## 2017-05-21 MED ORDER — TETANUS-DIPHTH-ACELL PERTUSSIS 5-2.5-18.5 LF-MCG/0.5 IM SUSP: 0.5000 mL | Freq: Once | INTRAMUSCULAR | Status: DC

## 2017-05-21 NOTE — Anesthesia Postprocedure Evaluation (Signed)
Anesthesia Post Note  Patient: Belinda Mcintosh  Procedure(s) Performed: * No procedures listed *     Patient location during evaluation: Mother Baby Anesthesia Type: Epidural Level of consciousness: awake Pain management: pain level controlled Vital Signs Assessment: post-procedure vital signs reviewed and stable Respiratory status: spontaneous breathing Cardiovascular status: stable Postop Assessment: no headache, no backache, epidural receding, patient able to bend at knees and no signs of nausea or vomiting Anesthetic complications: no    Last Vitals:  Vitals:   05/21/17 0345 05/21/17 0400  BP: (!) 125/93 123/60  Pulse: 95 (!) 105  Resp: 16 14  Temp:      Last Pain:  Vitals:   05/21/17 0400  TempSrc:   PainSc: 0-No pain   Pain Goal: Patients Stated Pain Goal: 3 (05/20/17 1930)               Edison PaceWILKERSON,Annis Lagoy

## 2017-05-21 NOTE — Lactation Note (Signed)
This note was copied from a baby's chart. Lactation Consultation Note  Patient Name: Belinda Mcintosh ZOXWR'UToday's Date: 05/21/2017 Reason for consult: Initial assessment   Initial consult at 1214 hrs old; Mom is P1; Ga 41.1; BW 7 lbs, 4.9 oz. Mom AMA; Hx of Thrombocytopenia.  Mom speaks Spanish but understands some English; FOB in room understand English well. Infant has breastfed x1 (10 min) + attempt x2 (5 min) + EBM via spoon x1 (4 ml); voids-1; stools MSF at birth. Infant was asleep in crib when [name] room; parents stated they wanted help with feeding and teaching them how to feed baby. LC unwrapped swaddled and clothed baby and put STS with mom in football hold.  Infant began crying and showing feeding cues. Feeding cues reviewed with parents and encouraged to feed every time infant shows cues. Taught mom how to hand express with return demonstration and observation of colostrum easily expressed.   Parents asked lots of questions throughout consult about "milk" if baby was "getting enough" or "eating".  Lots teaching done but will probably need review or further encouragement.   Latched baby in football hold on left side without nipple shield to start.  Infant took a few sucks and would come off.  NS #20 applied which was already in room.  Taught mom how to put on and how to sandwich breast for support.   Encouraged mom to hand express prior to latching to start flow of milk and to post-pump after day time feeding.   Infant latched with NS#20 after a few more on/off attempts and stayed latched; infant was able to get into a sucking pattern; few swallows heard.  LS-6.  Infant fed for 20 minutes and then came off content. FOB wanted LC to assist with putting clothes back on infant, but when LC took infant away from breast infant began showing cues again. LC taught FOB how to assist with feeding.  Infant was still at breast when LC left.   Educated to feed with cues, cluster feeding, and size  of infant's stomach. Lactation brochure given; informed of hospital support group and telephone number for questions or OP support. Encouraged to call for assistance with feeding as needed.  RN has been assisting parents with feeding.  Report given to RN of consult. RN to set up DEBP later for post-pumping when parents are more comfortable with latching infant.  At time of consult, LC felt it would be too much information to teach at this time; focus was on basic latching with NS.      Maternal Data Has patient been taught Hand Expression?: Yes (with return demonstration but mom may need review) Does the patient have breastfeeding experience prior to this delivery?: No  Feeding Feeding Type: Breast Fed Length of feed: 20 min  LATCH Score/Interventions Latch: Repeated attempts needed to sustain latch, nipple held in mouth throughout feeding, stimulation needed to elicit sucking reflex. Intervention(s): Teach feeding cues;Skin to skin;Waking techniques Intervention(s): Breast compression  Audible Swallowing: A few with stimulation Intervention(s): Hand expression  Type of Nipple: Flat  Comfort (Breast/Nipple): Soft / non-tender     Hold (Positioning): Assistance needed to correctly position infant at breast and maintain latch. Intervention(s): Breastfeeding basics reviewed;Support Pillows;Skin to skin  LATCH Score: 6  Lactation Tools Discussed/Used Tools: Nipple Shields (Flat Nipple; everts some with lots of stimulation) Nipple shield size: 20 WIC Program: No   Consult Status Consult Status: Follow-up Date: 05/22/17 Follow-up type: In-patient    Belinda Mcintosh, Belinda Mcintosh  Belinda Mcintosh 05/21/2017, 5:11 PM

## 2017-05-21 NOTE — Progress Notes (Signed)
Patient has refused an [employer] tradernterpreter. Eda H Royal Interpreter.

## 2017-05-21 NOTE — Lactation Note (Signed)
This note was copied from a baby's chart. Lactation Consultation Note  Patient Name: Belinda Mcintosh ZOXWR'UToday's Date: 05/21/2017 Reason for consult: Initial assessment;Other (Comment) (baby STS on moms chest sleeping, LC encouraged to call w/ feeding cues for latch assessment / also mom requested to vistt w family )  [name] LeisureBaby is 1312 hours old LC reviewed doc flow sheets, spoon fed 4 ml early am ( 0510 ), Breast fed with #20 NS at 0715 - 10 mins, and at 0800 5 mins.  MBU RN Wonda HornerSusie Burns reported there was colostrum  in the Nipple Shield after feeding and the nipple Shield fit well.  Mother informed of post-discharge support and given phone number to the lactation department, including services for phone call assistance; out-patient appointments; and breastfeeding support group. List of other breastfeeding resources in the community given in the handout. Encouraged mother to call for problems or concerns related to breastfeeding.    Maternal Data    Feeding    LATCH Score/Interventions                      Lactation Tools Discussed/Used WIC Program: No (per mom )   Consult Status Consult Status: Follow-up (RN aware mom needs to call for latch assessment ) Date: 05/21/17 Follow-up type: In-patient    Belinda Mcintosh 05/21/2017, 2:32 PM

## 2017-05-22 MED ORDER — POLYETHYLENE GLYCOL 3350 17 G PO PACK
17.0000 g | PACK | Freq: Every day | ORAL | Status: DC
  Administered 2017-05-22 – 2017-05-23 (×2): 17 g via ORAL
  Filled 2017-05-22 (×3): qty 1

## 2017-05-22 NOTE — Progress Notes (Signed)
Patient called out asking for medication, stating her pain medication was due. Entered room to assess pain score. Patient states she received her medication at 0600 this morning and wanted the same thing. Informed patient that the medication she received at that time was her scheduled ibuprofen and that would be due at 1200 again. Asked patient what her pain score was, in which she stated a 5 of cramping. Offered patient her oxycodone, in which she declined and stated she could wait until 1200 for her ibuprofen. Encouraged patient to keep her bladder empty and discussed that breast feeding will increase cramping as well. Patient stated she did not want to take "the stronger medicine right now." Offered patient tylenol in which she first declined, then decided to go ahead and take. Reminded patient that if she does want anything more than ibuprofen, she must call for it since those are as needed medications. Earl Galasborne, Linda HedgesStefanie BucyrusHudspeth

## 2017-05-22 NOTE — Progress Notes (Signed)
Pt asked for ice packs. Pt is over 24 hours since delivery. RN educated parents on ice versus heat after 24 hours. Pt still requesting ice packs despite education.

## 2017-05-22 NOTE — Plan of Care (Signed)
Problem: Nutritional: Goal: Mothers verbalization of comfort with breastfeeding process will improve Outcome: Completed/Met Date Met: 05/22/17 Assisted patient with latching baby and discussed proper positioning. Demonstrated hand expression while baby at the breast and patient demonstrated understanding. Discussed the importance of hand expression in between feedings as well and using hand pump in between feedings.

## 2017-05-22 NOTE — Lactation Note (Signed)
This note was copied from a baby's chart. Lactation Consultation Note  Patient Name: Girl Redmond Basemanva Vega-Torres ZOXWR'UToday's Date: 05/22/2017 Reason for consult: Follow-up assessment;Difficult latch  Baby 37 hours old. Assisted mom with latching baby to right breast in football position, but baby sleepy at breast and would not suckle. Assisted mom with hand expression with only a few drops at right breast, and moisture at left. Discussed progression of milk coming to volume and supply and demand. Discussed baby's behavior, short feeds and low volume of EBM and enc supplementing with formula. Parents agreeable, so assisted with supplementing 10 ml of Alimentum, and FOB able to supplement with syringe and finger-feeding and baby tolerated well. Enc mom to put baby to breast with cues, supplement with EBM/formula according to guidelines--which were given with review. Enc mom to post-pump after baby fed followed by hand expression.   Discussed assessment and interventions with Gaylyn RongKris, RN.   Maternal Data    Feeding Feeding Type: Formula Length of feed: 0 min  LATCH Score/Interventions Latch: Too sleepy or reluctant, no latch achieved, no sucking elicited. Intervention(s): Skin to skin;Teach feeding cues;Waking techniques Intervention(s): Adjust position;Assist with latch  Audible Swallowing: None Intervention(s): Skin to skin;Hand expression  Type of Nipple: Flat Intervention(s): Double electric pump  Comfort (Breast/Nipple): Filling, red/small blisters or bruises, mild/mod discomfort  Problem noted: Mild/Moderate discomfort Interventions (Mild/moderate discomfort): Post-pump  Hold (Positioning): Assistance needed to correctly position infant at breast and maintain latch. Intervention(s): Breastfeeding basics reviewed;Support Pillows;Position options;Skin to skin  LATCH Score: 3  Lactation Tools Discussed/Used Tools: Nipple Shields Nipple shield size: 20   Consult Status Consult Status:  Follow-up Date: 05/23/17 Follow-up type: In-patient    Sherlyn HayJennifer D Jaimey Franchini 05/22/2017, 4:11 PM

## 2017-05-22 NOTE — Progress Notes (Addendum)
During Motrin administration, RN assessed pt's pain asking from 0-10 rate your pain, 0 being no pain. Patient rated pain 2/10, so Motrin was administered. Pt took medication and then asked why she was not receiving the "other orange pill", referring to the Oxycodone IR. RN explained that the medication was ordered for breakthrough pain, which is when the pain is at a higher level and not relieved by other measures. Pt became very upset, stating that she took the medication earlier when she wasn't hurting and doesn't understand why she can't take it now. RN offered Tylenol in addition to the Motrin and other non-pharmacological pain relief measures, which the pt refused. RN explained the situation again at which time pt and husband verbalized understanding. 30 minutes later, pt called out for stronger pain medication. RN went to evaluate the situation, pt rating her pain at 6/10. Pt confirmed that her pain went from a 2 to a 6 in 30 minutes. Pt demanding the stronger pain medication.

## 2017-05-22 NOTE — Progress Notes (Signed)
Post Partum Day #1 Subjective: no complaints, up ad lib and tolerating PO; breastfeeding going slowly; concerned re no BM x 2-3d and no success with mineral oil yesterday  Objective: Blood pressure (!) 108/57, pulse 67, temperature 98.4 F (36.9 C), temperature source Oral, resp. rate 18, height 5\' 1"  (1.549 m), weight 73.9 kg (163 lb), last menstrual period 08/23/2016, SpO2 99 %, unknown if currently breastfeeding.  Physical Exam:  General: alert, cooperative and no distress Lochia: appropriate Uterine Fundus: firm DVT Evaluation: No evidence of DVT seen on physical exam.   Recent Labs  05/20/17 0455  HGB 12.5  HCT 37.0    Assessment/Plan: Plan for discharge tomorrow and Lactation consult  Will order Miralax   LOS: 2 days   Cam HaiSHAW, Ellanie Oppedisano CNM 05/22/2017, 8:37 AM

## 2017-05-22 NOTE — Progress Notes (Signed)
Offered interpreter to patient and significant other. Patient and significant other decline interpreter. Patient and significant other verbalize and communicate very well in [place].  Belinda Mcintosh, Belinda HedgesStefanie Mcintosh

## 2017-05-22 NOTE — Plan of Care (Signed)
Problem: Nutritional: Goal: Mothers verbalization of comfort with breastfeeding process will improve Outcome: Completed/Met Date Met: 05/22/17 Set mother up with DEBP and discussed set up, pumping and frequency. Mother pumped and then encouraged to hand express after into measuring cup. Used syringe and demonstrated to mother and father how to syringe feed baby what she pumped and hand expressed. Discussed importance of routine pumping for milk supply. Parents verbalized understanding of information. Baby was fed about 1 cc of colostrum after nursing with nipple shield and for the moment seemed content. Maxwell Caul, Leretha Dykes [place]

## 2017-05-23 MED ORDER — IBUPROFEN 600 MG PO TABS: 600.0000 mg | ORAL_TABLET | Freq: Four times a day (QID) | ORAL | 0 refills | Status: DC | PRN

## 2017-05-23 NOTE — Progress Notes (Signed)
RN sat in room with patient for 30 minutes explaining breastfeeding again. FOB states infant had 30cc formula at 2100, again at 2330, was still hungry and had 15cc at 0030. FOB thinks baby is still hungry and trying to feed more formula. RN explained to parents that the baby is most likely full and has a stomach ache from eating so much and not burping. RN explained supplement guidelines several times to parents and educated for 30 minutes, at which time parents said they were still confused. RN asked lactation to go into room to help clarify additional information. Parents have been set up with hand pump, DEBP, nipple shield, syringe feeding, coconut oil, comfort gels, shells, and reviewed hand expression again.

## 2017-05-23 NOTE — Lactation Note (Signed)
This note was copied from a baby's chart. Lactation Consultation Note  Patient Name: Belinda Mcintosh ONGEX'BToday's Date: 05/23/2017 Reason for consult: Follow-up assessment;Difficult latch  Baby 57 hours old. Mom reports that she is very confused and not sure how much to give baby. However, parents have been nursing and supplementing with curve-tipped syringe and mom has continued to use DEBP. Mom states that she is only able to collect 1-2 ml of EBM while pumping. Mom asked about using Fenugreek and other galactagogues to increase breast milk supply. Discussed with mom that having baby at breast and pumping to stimulate breast are the primary methods of increasing supply, and Fenugreek is secondary. Enc mom to discuss galactagogue use with her OB.   Assisted mom to latch baby to left breast in football position using #20 NS. Baby latched deeply and suckled rhythmically with no swallows noted and no EBM in NS. Set mom up with #5 [place] feeding tube and syringe and baby took 30 ml of Alimentum and tolerated well. Plan is for mom to put baby to breast with cues, supplement with EBM/formula using SNS and then pump afterwards. Watched mom begin pumping and #27 flanges are a good fit. Enc FOB to burp baby well sitting upright, and enc parents to increase supplementation gradually. [name], RN in the room and aware of the plan. Enc parents to call for Carma LeavenMiakita if needing assistance with SNS. Discussed how to clean SNS system with parents as well. Discussed assessment and interventions with [name], PNP and [name], MD.   Maternal Data Formula Feeding for Exclusion: No Has patient been taught Hand Expression?: Yes Does the patient have breastfeeding experience prior to this delivery?: No  Feeding Feeding Type: Breast Fed Length of feed: 15 min  LATCH Score/Interventions                      Lactation Tools Discussed/Used Tools: 69F feeding tube / Syringe;Nipple Shields Nipple shield size:  20 Breast pump type: Double-Electric Breast Pump   Consult Status Consult Status: Follow-up Date: 05/24/17 Follow-up type: In-patient    Belinda HayJennifer D Dalayza Mcintosh 05/23/2017, 11:48 AM

## 2017-05-23 NOTE — Lactation Note (Signed)
This note was copied from a baby's chart. Lactation Consultation Note RN stated parents giving to much formula for supplement d/t baby acting hungry. W/[place] interpreter Sherrlyn HockMayeluz [phone]. Mom states she doesn't understand what to do when the baby acts hungry and wants to feed. Explained the baby is cluster feeding, it is normal. She can only supplement every 2-3 hours, not every time the baby wants to eat. Put to the breast as much as baby wants but can't supplement after every feeding.  Moms breast are heavy, edema to areolas, mom stated she used pump for 20 min. And got nothing. Explained again normal. Hand expression taught demonstrating colostrum. Before left, taught FOB and mom hand expression. LC hand expressed easily 3 ml. Nipples are raw, cracked, and has painful latching using #20 nipple shield. Fills NS. Very painful latching and suckling. Applied #24 NS d/t edema and fitting tight. Appears to big. Noted nipple looks smaller after some suckling. Baby not aggressive at breast which possibly indicated baby isn't hungry but wants to suckle on breast. Explained that to parents multiple times w/interpreter.   Took a lot of time answer over and over questions using interpreter. Parents doesn't want to hear that baby wants to BF often, they want to sleep.  Asked RN to give shells d/t flat nipple, give comfort gels d/t raw nipples. Encouraged to roll nipples in finger tips to define nipple from areola to apply NS.  Patient Name: Belinda Mcintosh WJXBJ'YToday's Date: 05/23/2017 Reason for consult: Follow-up assessment;Breast/nipple pain   Maternal Data    Feeding Feeding Type: Breast Fed Length of feed: 20 min  LATCH Score/Interventions Latch: Too sleepy or reluctant, no latch achieved, no sucking elicited. Intervention(s): Skin to skin;Teach feeding cues;Waking techniques Intervention(s): Adjust position;Assist with latch;Breast massage;Breast compression  Audible Swallowing:  None Intervention(s): Hand expression;Skin to skin Intervention(s): Alternate breast massage  Type of Nipple: Flat  Comfort (Breast/Nipple): Engorged, cracked, bleeding, large blisters, severe discomfort Problem noted: Cracked, bleeding, blisters, bruises Intervention(s): Reverse pressure;Expressed breast milk to nipple  Problem noted: Filling;Severe discomfort;Cracked, bleeding, blisters, bruises Interventions (Filling): Massage;Reverse pressure;Frequent nursing Interventions  (Cracked/bleeding/bruising/blister): Expressed breast milk to nipple Interventions (Mild/moderate discomfort): Comfort gels;Breast shields;Post-pump;Reverse pressue;Hand expression;Hand massage Interventions (Severe discomfort): Double electric pum  Hold (Positioning): Full assist, staff holds infant at breast Intervention(s): Breastfeeding basics reviewed;Support Pillows;Position options;Skin to skin  LATCH Score: 1  Lactation Tools Discussed/Used Tools: Pump;Nipple Shields;Comfort gels;Shells Nipple shield size: 20;24 Shell Type: Inverted Breast pump type: Double-Electric Breast Pump   Consult Status Consult Status: Follow-up Date: 05/23/17 Follow-up type: In-patient    Charyl DancerCARVER, Ryleeann Urquiza G 05/23/2017, 1:39 AM

## 2017-05-23 NOTE — Discharge Instructions (Signed)
Instrucciones para la mamá sobre los cuidados en el hogar °(Home Care Instructions for Mom) °ACTIVIDAD °· Reanude sus actividades regulares de forma gradual. °· Descanse. Tome siestas cuando el bebé duerme. °· No levante objetos que pesen más de 10 libras (4,5 kg) hasta que el médico se lo autorice. °· Evite las actividades que demandan mucho esfuerzo y energía (que son extenuantes) hasta que el médico se lo autorice. Caminar a un ritmo tranquilo a moderado siempre es más seguro. °· Si tuvo un parto por cesárea: °? No pase la aspiradora, suba escaleras o conduzca un vehículo durante 4 o 6 semanas. °? Pídale a alguien que le brinde ayuda con las tareas domésticas hasta que pueda realizarlas por su cuenta. °? Haga ejercicios como se lo haya indicado el médico, si corresponde. ° °HEMORRAGIA VAGINAL °Probablemente continúe sangrando durante 4 o 6 semanas después del parto. Generalmente, la cantidad de sangre disminuye y el color se hace más claro con el transcurso del tiempo. Sin embargo, si usted está demasiado activa, el color de la sangre puede ser rojo brillante. Si necesita cambiarse la compresa higiénica en menos de una hora o tiene coágulos grandes: °· Permanezca acostada. °· Eleve los pies. °· Coloque compresas frías en la zona inferior del abdomen. °· Haga reposo. °· Comuníquese con su médico. °Si está amamantando, podría volver a tener su período entre las 8 semanas después del parto y el momento en que deje de amamantar. Si no está amamantando, volverá a tener su período 6 u 8 semanas después del parto. °CUIDADOS PERINEALES °La zona perineal o perineo, es la parte del cuerpo que se encuentra entre los muslos. Después del parto, esta zona necesita un cuidado especial. Siga las siguientes indicaciones como se lo haya indicado su médico. °· Tome baños de inmersión durante 15 o 20 minutos. °· Utilice apósitos o aerosoles analgésicos y cremas como se lo hayan indicado. °· No utilice tampones ni se haga duchas  vaginales hasta que el sangrado vaginal se haya detenido. °· Cada vez que vaya al baño: °? Use una botella perineal. °? Cámbiese el apósito. °? Use papel tisú en lugar de papel higiénico hasta que se cure la sutura. °· Haga ejercicios de Kegel todos los días. Los ejercicios Kegel ayudan a mantener los músculos que sostienen la vagina, la vejiga y los intestinos. Estos ejercicios se pueden realizar mientras está parada, sentada o acostada. Para hacer los ejercicios de Kegel: °? Tense los músculos del estómago y los que rodean el canal de parto. °? Mantenga esta posición durante unos segundos. °? Relájese. °? Repita hasta hacerlos 5 veces seguidas. °· Para evitar las hemorroides o que estas empeoren: °? Beba suficiente líquido para mantener la orina clara o de color amarillo pálido. °? Evite hacer fuerza al defecar. °? Tome los medicamentos y laxantes de venta libre como se lo haya indicado el médico. °CUIDADO DE LAS MAMAS °· Use un buen sostén. °· Evite tomar analgésicos de venta libre para las molestias de los pechos. °· Aplique hielo en los pechos para aliviar las molestias tanto como sea necesario: °? Ponga el hielo en una bolsa plástica. °? Coloque una toalla entre la piel y la bolsa de hielo. °? Aplique el hielo durante 20, o como se lo haya indicado el médico. ° °NUTRICIÓN °· Mantenga una dieta bien balanceada. °· No intente perder de peso rápidamente reduciendo el consumo de calorías. °· Tome sus vitaminas prenatales hasta el control de postparto o hasta que su médico se lo indique. ° °DEPRESIÓN POSTPARTO °  Puede sentir deseos de llorar sin motivo aparente y verse incapaz de enfrentarse a todos los cambios que implica tener un bebé. Este estado de ánimo se llama depresión postparto. La depresión postparto ocurre porque sus niveles hormonales sufren cambios después del parto. Si usted tiene depresión postparto, busque contención por parte de su pareja, sus amigos y su familia. Si la depresión no desaparece por  sí sola después de algunas semanas, concurra a su médico. °AUTOEXAMEN DE MAMAS °Realícese autoexámenes en el mismo momento cada mes. Si está amamantando, el mejor momento de controlar sus mamas es después de alimentar al bebé, cuando los pechos no están tan llenos. Si está amamantando y su período ya comenzó, controle sus mamas el día 5, 6 o 7 de su período. °Informe a su médico de cualquier protuberancia, bulto o secreción. Si está amamantando, las mamas normalmente tienen bultos. Esto es transitorio y no es un riesgo para la salud. °INTIMIDAD Y SEXUALIDAD °Debe evitar las relaciones sexuales durante al menos 3 o 4 semanas después del parto o hasta que el flujo de color rojo amarronado haya desaparecido completamente. Si no desea quedar embarazada nuevamente, use algún método anticonceptivo. Después del parto, puede quedar embarazada incluso si no ha tenido todavía el período. °SOLICITE ATENCIÓN MÉDICA SI: °· Se siente incapaz de controlar los cambios que implica tener un hijo y esos sentimientos no desaparecen después de algunas semanas. °· Detecta una protuberancia, bulto o secreción en sus mamas. ° °SOLICITE ATENCIÓN MÉDICA DE INMEDIATO SI: °· Debe cambiarse la compresa higiénica en 1 hora o menos. °· Tiene los siguientes síntomas: °? Dolor intenso o calambres en la parte inferior del abdomen. °? Una secreción vaginal con mal olor. °? Fiebre que no se alivia con los medicamentos. °? Una zona de la mama se pone roja y le causa dolor, y además usted tiene fiebre. °? Una pantorrilla enrojecida y con dolor. °? Repentino e intenso dolor en el pecho. °? Falta de aire. °? Micción dolorosa o con sangre. °? Problemas visuales. °· Vómitos durante 12 horas o más. °· Dolor de cabeza intenso. °· Tiene pensamientos serios acerca de lastimarse a usted misma o dañar al niño o a otra persona. ° °Esta información no tiene como fin reemplazar el consejo del médico. Asegúrese de hacerle al médico cualquier pregunta que  tenga. °Document Released: 10/30/2005 Document Revised: 02/21/2016 Document Reviewed: 05/03/2015 °Elsevier Interactive Patient Education © 2017 [employer] Inc. ° °

## 2017-05-23 NOTE — Discharge Summary (Signed)
OB Discharge Summary     Patient Name: Belinda Mcintosh DOB: Aug 27, 1981 MRN: [id number]  Date of admission: 05/20/2017 Delivering MD: Donette Larry   Date of discharge: 05/23/2017  Admitting diagnosis: 41wks contractions  Intrauterine pregnancy: [redacted]w[redacted]d     Secondary diagnosis:  Active Problems:   Normal labor and delivery  Additional problems: AMA     Discharge diagnosis: Term Pregnancy Delivered                                                                                                Post partum procedures:none  Augmentation: AROM and Pitocin  Complications: None  Hospital course:  Onset of Labor With Vaginal Delivery     36 y.o. yo G2P1011 at [redacted]w[redacted]d was admitted in Latent Labor on 05/20/2017. Patient had an uncomplicated labor course as follows:  Membrane Rupture Time/Date: 10:15 AM ,05/20/2017   Intrapartum Procedures: Episiotomy: None [1]                                         Lacerations:  2nd degree [3]  Patient had a delivery of a Viable infant. 05/21/2017  Information for the patient's newborn:  Ponciano Ort Girl Hebah [[id number]]  Delivery Method: Vag-Spont    Pateint had an uncomplicated postpartum course.  She is ambulating, tolerating a regular diet, passing flatus, and urinating well. Patient is discharged home in stable condition on 05/23/17.   Physical exam  Vitals:   05/21/17 1834 05/22/17 0606 05/22/17 1700 05/23/17 0627  BP: (!) 100/54 (!) 108/57 (!) 114/53 (!) 116/58  Pulse: 68 67 73 69  Resp: 18 18 18 18   Temp: 98.4 F (36.9 C) 98.4 F (36.9 C) 98.1 F (36.7 C) 98 F (36.7 C)  TempSrc: Oral Oral Oral Oral  SpO2:      Weight:      Height:       General: alert and cooperative Lochia: appropriate Uterine Fundus: firm Incision: N/A DVT Evaluation: No evidence of DVT seen on physical exam. Labs: Lab Results  Component Value Date   WBC 9.7 05/20/2017   HGB 12.5 05/20/2017   HCT 37.0 05/20/2017   MCV 88.9 05/20/2017   PLT 125 (L)  05/20/2017   CMP Latest Ref Rng & Units 01/01/2015  Glucose 70 - 99 mg/dL 92  BUN 6 - 23 mg/dL 16  Creatinine 9.52 - 8.41 mg/dL 3.24  Sodium 401 - 027 mmol/L 136  Potassium 3.5 - 5.1 mmol/L 3.9  Chloride 96 - 112 mmol/L 107  CO2 19 - 32 mmol/L 23  Calcium 8.4 - 10.5 mg/dL 8.9  Total Protein 6.0 - 8.3 g/dL 7.9  Total Bilirubin 0.3 - 1.2 mg/dL 0.3  Alkaline Phos 39 - 117 U/L 55  AST 0 - 37 U/L 21  ALT 0 - 35 U/L 31    Discharge instruction: per After Visit Summary and "Baby and Me Booklet".  After visit meds:  Allergies as of 05/23/2017      Reactions   Shrimp [shellfish Allergy] Itching  Medication List    STOP taking these medications   pantoprazole 20 MG tablet Commonly known as:  PROTONIX     TAKE these medications   docusate sodium 100 MG capsule Commonly known as:  COLACE Take 1 capsule (100 mg total) by mouth 2 (two) times daily.   ibuprofen 600 MG tablet Commonly known as:  ADVIL,MOTRIN Take 1 tablet (600 mg total) by mouth every 6 (six) hours as needed.   Prenatal Vitamin 27-0.8 MG Tabs Take 1 tablet by mouth daily.       Diet: routine diet  Activity: Advance as tolerated. Pelvic rest for 6 weeks.   Outpatient follow up:6 weeks Follow up Appt:No future appointments. Follow up Visit:No Follow-up on file.  Postpartum contraception: Depo Provera  Newborn Data: Live born female  Birth Weight: 7 lb 4.9 oz (3315 g) APGAR: 4, 8  Baby Feeding: Breast Disposition:home with mother   05/23/2017 Cam HaiSHAW, KIMBERLY, CNM  8:30 AM

## 2017-05-24 ENCOUNTER — Ambulatory Visit: Payer: Self-pay

## 2017-05-24 NOTE — Lactation Note (Signed)
This note was copied from a baby's chart. Lactation Consultation Note  Patient Name: Belinda Mcintosh BJYNW'GToday's Date: 05/24/2017 Reason for consult: Follow-up assessment  Baby 80 hours old. Offered interpretive services, but parents declined stating that it is not helpful. Parents report that baby is latching and nursing fine, and they are continuing to supplement with EBM/formula after each feeding--and sometimes instead of a breastfeed. Enc parents to keep putting baby to breast with cues, then supplement with EBM/formula--increasing the supplementation amount gradually until baby satisfied for 2-3 hours between feedings. Parents given a written plan to follow, and a second #24 NS and 5 [place] feeding system. Discussed with mom the need to keep pumping as long as she is using the NS, and the goal of moving baby away from its use as soon as she is able. Mom aware of support group for weighing baby before and after a feeding, and parents given an outpatient appointment with Lactation for Monday, 05/28/17 at 3:30.    Mom's breasts are tight and outer aspects feel firm. Mom given bags of ice and enc to keep on breast for 10-15 minutes at a time for comfort and prior to pumping to allow milk to flow more easily. Discussed engorgement prevention/treatment measures including icing, massage and use of sports bra for hands-free pumping. Mom has personal DEBP at home.   Parents aware of OP/BFSG and LC phone line assistance after D/C. Belinda SettleBrooke, RN aware of assessment and interventions.    Maternal Data    Feeding Feeding Type: Breast Milk with Formula added  LATCH Score/Interventions                      Lactation Tools Discussed/Used Tools: 77F feeding tube / Syringe Nipple shield size: 24 Breast pump type: Double-Electric Breast Pump   Consult Status Consult Status: PRN    Sherlyn HayJennifer D Courtny Bennison 05/24/2017, 10:58 AM

## 2017-05-28 ENCOUNTER — Other Ambulatory Visit: Payer: Self-pay | Admitting: General Practice

## 2017-05-28 ENCOUNTER — Ambulatory Visit: Payer: Self-pay

## 2017-05-28 ENCOUNTER — Encounter: Payer: Self-pay | Admitting: General Practice

## 2017-05-28 NOTE — Lactation Note (Signed)
This note was copied from a baby's chart. Lactation Consult  Mother's reason for visit:  Feeding assessment, difficult latch Visit Type:  Feeding assessment Appointment Notes:  See below Consult:  Initial Lactation Consultant:  Belinda BlalockSharon S Nechuma Mcintosh  ________________________________________________________________________ Belinda FloresBaby's Name:  Belinda Mcintosh Date of Birth:  05/21/2017 Pediatrician:  Belinda Mcintosh Gender:  female Gestational Age: 6856w1d (At Birth) Birth Weight:  7 lb 4.9 oz (3315 g) Weight at Discharge:                          Date of Discharge:   There were no vitals filed for this visit. [facility]:  7 lb 2.6 oz     Location:Pediatrician's office Weight today:  7 lb 10.9 oz 3486 grams with clean diaper   ________________________________________________________________________  Mother's Name: Belinda Mcintosh Type of delivery:  Vaginal, Spontaneous Delivery Breastfeeding Experience:  Breast feeding is painful and mom does not want to latch infant to the breast Maternal Medical Conditions:  None listed Maternal Medications:  PNV, Ibuprofen, stool softener  ________________________________________________________________________  Breastfeeding History (Post Discharge)  Frequency of breastfeeding:  0-1 x a day Duration of feeding:  5 minutes  Supplementation  Breastmilk:  Volume 120 ml Frequency:  Every 2-3    Method:  Bottle,   Pumping  Type of pump:  Medela pump in style Frequency:  Every 2-3 hours Volume:  140 ml  Infant Intake and Output Assessment  Voids:  9-10 in 24 hrs.  Color:  Clear yellow Stools:  4-5 in 24 hrs.  Color:  Yellow  ________________________________________________________________________  Maternal Breast Assessment  Breast:  Full Nipple:  Flat, cracked and bruised Pain level:  5 Pain interventions:  Bra and All purpose nipple cream, coconut  oil  _______________________________________________________________________ Feeding Assessment/Evaluation  Initial feeding assessment:  Infant's oral assessment:  WNL  Positioning:  Cross cradle Right breast  LATCH documentation:  Latch:  1 = Repeated attempts needed to sustain latch, nipple held in mouth throughout feeding, stimulation needed to elicit sucking reflex.  Audible swallowing:  1 = A few with stimulation  Type of nipple:  1 = Flat  Comfort (Breast/Nipple):  1 = Filling, red/small blisters or bruises, mild/mod discomfort  Hold (Positioning):  2 = No assistance needed to correctly position infant at breast  LATCH score:  5  Attached assessment:  Deep  Lips flanged:  Yes.    Lips untucked:  No.  Suck assessment:  Displays both  Tools:  Nipple shield 24 mm Instructed on use and cleaning of tool:  Yes.    Pre-feed weight:  3486 g  (7 lb. 10.9 oz.) Post-feed weight:  3486 g (7 lb. 10.9 oz.) Amount transferred:  0 ml Amount supplemented:  70 ml  No    Total amount transferred:  0 ml Total supplement given:  70 ml  Feeding consult with mom of 7 day old infant. Mom reports he nipples are cracked and hurting and she does not want to put infant to breast. Mom with large firm breasts and flat nipples. She is using a # 24 NS for feedings.   Mom was willing to let me assist her with latch. Worked with mom on correct application of NS, her nipple did not pull up well into the NS. Infant latched and fed for about 10 minutes-she transferred 0 ml. Mom was in pain for most of the feeding.   Both of her nipples are cracked  and bruised. She has been using EBM, Coconut oil and Lanolin. Asked mom not to use Lanolin on cracked skin. Mom to contact OB clinic to request All Purpose Nipple Ointment.   Enc mom to offer infant breast as she is able to tolerate it to keep infant familiar with the breast. Mom says she understands. She is being pressured by family to put infant to breast  even though it is very painful to her. Discussed with mom setting limits with family members as needed. Dad reports he will support mom is she is not able to put infant to breast. Mom is to use APNO for a few days and then try infant back at breast. She is also to return next week for a follow up LC appt to try and get infant to breast.   Mom is doing a great job with pumping and her milk supply has been increasing. She is using the minimal setting on the pump. She is aware to continue pumping to maintain milk supply since infant not willing to go to breast.    Dad was asking about giving infant supplement that they give in [place] to prevent gas, enc him to call and speak with Pediatrician first. He was unable to give name of supplement but reports it is used in [place] restaurants here in [place].   Dad reports he has been "milking" infant's breasts as in [place] they believe that if you get the milk out of the infant they will not have an odor when they are older. Informed parents this is not recommended and they would need to again speak to their pediatrician. Mom is Hispanic and does not want this practice to be carried out. Infants breasts were noted to be swollen with "Witch's Milk".   Parents were concerned infant's labia was irritated as it was a darker pink that her skin, they are applying Desitin. It appeared to be a normal labial darkening vs irritation, slightly darker than infant skin.   Written plan given to parents: 1. Continue to offer breast as mom and baby are able. Use # 24 NS with feedings.  2. Feed infant EBM if at least 64-85 ml/feeding via bottle 3. Continue to pump with DEBP every 2-3 hours for 15-20 minutes 4. Call OB clinic to request All Purpose Nipple Ointment 5. Do not use Lanolin on nipples while cracked 6. Follow up Lactation Appt Tuesday 06/05/17@10 :00 7. Call for assistance as needed 8. Keep [place] the good work!

## 2017-05-28 NOTE — Progress Notes (Signed)
Called in all purpose nipple ointment [facility] for patient per Dr Adrian BlackwaterStinson.

## 2017-05-31 ENCOUNTER — Emergency Department (HOSPITAL_COMMUNITY)
Admission: EM | Admit: 2017-05-31 | Discharge: 2017-05-31 | Disposition: A | Discharge status: Home / Self Care | Payer: Self-pay | Attending: Emergency Medicine | Admitting: Emergency Medicine

## 2017-05-31 ENCOUNTER — Emergency Department (HOSPITAL_COMMUNITY): Payer: Self-pay

## 2017-05-31 ENCOUNTER — Encounter (HOSPITAL_COMMUNITY): Payer: Self-pay | Admitting: Emergency Medicine

## 2017-05-31 DIAGNOSIS — N61 Mastitis without abscess: Secondary | ICD-10-CM | POA: Insufficient documentation

## 2017-05-31 DIAGNOSIS — N12 Tubulo-interstitial nephritis, not specified as acute or chronic: Secondary | ICD-10-CM

## 2017-05-31 DIAGNOSIS — Z87891 Personal history of nicotine dependence: Secondary | ICD-10-CM | POA: Insufficient documentation

## 2017-05-31 DIAGNOSIS — N10 Acute tubulo-interstitial nephritis: Secondary | ICD-10-CM | POA: Insufficient documentation

## 2017-05-31 LAB — CBC WITH DIFFERENTIAL/PLATELET
Basophils Absolute: 0 10*3/uL (ref 0.0–0.1)
Basophils Relative: 0 %
Eosinophils Absolute: 0 10*3/uL (ref 0.0–0.7)
Eosinophils Relative: 0 %
HCT: 39.6 % (ref 36.0–46.0)
Hemoglobin: 12.9 g/dL (ref 12.0–15.0)
Lymphocytes Relative: 6 %
Lymphs Abs: 0.7 10*3/uL (ref 0.7–4.0)
MCH: 28.9 pg (ref 26.0–34.0)
MCHC: 32.6 g/dL (ref 30.0–36.0)
MCV: 88.8 fL (ref 78.0–100.0)
Monocytes Absolute: 0.2 10*3/uL (ref 0.1–1.0)
Monocytes Relative: 2 %
Neutro Abs: 11.8 10*3/uL — ABNORMAL HIGH (ref 1.7–7.7)
Neutrophils Relative %: 92 %
Platelets: 226 10*3/uL (ref 150–400)
RBC: 4.46 MIL/uL (ref 3.87–5.11)
RDW: 14.3 % (ref 11.5–15.5)
WBC: 12.7 10*3/uL — ABNORMAL HIGH (ref 4.0–10.5)

## 2017-05-31 LAB — COMPREHENSIVE METABOLIC PANEL
ALT: 32 U/L (ref 14–54)
AST: 21 U/L (ref 15–41)
Albumin: 3.6 g/dL (ref 3.5–5.0)
Alkaline Phosphatase: 140 U/L — ABNORMAL HIGH (ref 38–126)
Anion gap: 9 (ref 5–15)
BUN: 7 mg/dL (ref 6–20)
CO2: 21 mmol/L — ABNORMAL LOW (ref 22–32)
Calcium: 9.1 mg/dL (ref 8.9–10.3)
Chloride: 103 mmol/L (ref 101–111)
Creatinine, Ser: 0.68 mg/dL (ref 0.44–1.00)
GFR calc Af Amer: >60 mL/min (ref >60)
GFR calc non Af Amer: >60 mL/min (ref >60)
Glucose, Bld: 132 mg/dL — ABNORMAL HIGH (ref 65–99)
Potassium: 3.7 mmol/L (ref 3.5–5.1)
Sodium: 133 mmol/L — ABNORMAL LOW (ref 135–145)
Total Bilirubin: 0.4 mg/dL (ref 0.3–1.2)
Total Protein: 7.2 g/dL (ref 6.5–8.1)

## 2017-05-31 LAB — URINALYSIS, ROUTINE W REFLEX MICROSCOPIC
Bacteria, UA: NONE SEEN
Bilirubin Urine: NEGATIVE
Glucose, UA: NEGATIVE mg/dL
Ketones, ur: NEGATIVE mg/dL
Nitrite: NEGATIVE
Protein, ur: 30 mg/dL — AB
Specific Gravity, Urine: 1.009 (ref 1.005–1.030)
pH: 7 (ref 5.0–8.0)

## 2017-05-31 LAB — I-STAT BETA HCG BLOOD, ED (MC, WL, AP ONLY): I-stat hCG, quantitative: 12.1 m[IU]/mL — ABNORMAL HIGH (ref <5)

## 2017-05-31 LAB — I-STAT CG4 LACTIC ACID, ED
Lactic Acid, Venous: 2.12 mmol/L (ref 0.5–1.9)
Lactic Acid, Venous: 1.84 mmol/L (ref 0.5–1.9)

## 2017-05-31 MED ORDER — LIDOCAINE HCL (PF) 1 % IJ SOLN
INTRAMUSCULAR | Status: AC
  Administered 2017-05-31: 5 mL
  Filled 2017-05-31: qty 5

## 2017-05-31 MED ORDER — CEFTRIAXONE SODIUM 1 G IJ SOLR
1.0000 g | Freq: Once | INTRAMUSCULAR | Status: AC
  Administered 2017-05-31: 1 g via INTRAMUSCULAR
  Filled 2017-05-31: qty 10

## 2017-05-31 MED ORDER — OXYCODONE-ACETAMINOPHEN 5-325 MG PO TABS
1.0000 | ORAL_TABLET | Freq: Once | ORAL | Status: AC
  Administered 2017-05-31: 1 via ORAL
  Filled 2017-05-31: qty 1

## 2017-05-31 MED ORDER — DEXTROSE 5 % IV SOLN: 2.0000 g | Freq: Once | INTRAVENOUS | Status: DC

## 2017-05-31 MED ORDER — CEPHALEXIN 500 MG PO CAPS: 500.0000 mg | ORAL_CAPSULE | Freq: Four times a day (QID) | ORAL | 0 refills | Status: DC

## 2017-05-31 MED ORDER — ACETAMINOPHEN 325 MG PO TABS
650.0000 mg | ORAL_TABLET | Freq: Once | ORAL | Status: AC
  Administered 2017-05-31: 650 mg via ORAL

## 2017-05-31 MED ORDER — ACETAMINOPHEN 325 MG PO TABS
ORAL_TABLET | ORAL | Status: AC
  Filled 2017-05-31: qty 2

## 2017-05-31 MED ORDER — ACETAMINOPHEN 160 MG/5ML PO SOLN
ORAL | Status: AC
  Filled 2017-05-31: qty 20.3

## 2017-05-31 MED ORDER — MORPHINE SULFATE (PF) 4 MG/ML IV SOLN: 4.0000 mg | Freq: Once | INTRAVENOUS | Status: DC

## 2017-05-31 MED ORDER — SODIUM CHLORIDE 0.9 % IV BOLUS (SEPSIS)
1000.0000 mL | Freq: Once | INTRAVENOUS | Status: DC
Start: 2017-05-31 — End: 2017-05-31

## 2017-05-31 NOTE — ED Provider Notes (Signed)
[facility]-EMERGENCY DEPT Provider Note   CSN: [id number] Arrival date & time: 05/31/17  1947  By signing my name below, I, Belinda Mcintosh, attest that this documentation has been prepared under the direction and in the presence of Raeford Razor, MD. Electronically Signed: Rosario Mcintosh, ED Scribe. 05/31/17. 8:51 PM.  History   Chief Complaint Chief Complaint  Patient presents with  . Fever  . Headache  . Flank Pain   The history is provided by the patient. No language interpreter was used.    HPI Comments: Belinda Mcintosh is a 36 y.o. female with no pertinent PMHx, who presents to the Emergency Department complaining of intermittent right flank pain beginning several days ago. Pt recently underwent childbirth on 05/20/17. She states that since then she has been dx'd w/ a yeast infection and UTI; she is currently taking antibiotics for both of these with improvement. Today, she had a new onset of fever (febrile in the ED at 102.3), headache, dizziness. She also notes that one day following d/c from her birth she has been experiencing intermittent right flank pain with associated mild dysuria towards the end of urine voids. Pt describes her flank pain as sharp. This has been relieved by Ibuprofen. She also notes some pain and redness to her right breast. Denies urgency, frequency, hematuria, difficulty urinating, cough, shortness of breath, or any other associated symptoms.   Past Medical History:  Diagnosis Date  . Umbilical hernia 2018   Patient Active Problem List   Diagnosis Date Noted  . Normal labor and delivery 05/20/2017  . AMA (advanced maternal age) multigravida 35+ 05/14/2017  . Throat discomfort 05/07/2017  . Genital warts complicating pregnancy, third trimester 04/30/2017  . Thrombocytopenia during pregnancy (HCC) 03/07/2017  . Umbilical hernia 02/07/2017  . Encounter for supervision of normal first pregnancy in third trimester 11/21/2016   Past Surgical  History:  Procedure Laterality Date  . INDUCED ABORTION    . LIPOSUCTION    . NOSE SURGERY     OB History    Gravida Para Term Preterm AB Living   2 1 1  0 1 1   SAB TAB Ectopic Multiple Live Births   0 1 0 0 1       Home Medications    Prior to Admission medications   Medication Sig Start Date End Date Taking? Authorizing Provider  docusate sodium (COLACE) 100 MG capsule Take 1 capsule (100 mg total) by mouth 2 (two) times daily. 03/06/17   Judeth Horn, NP  ibuprofen (ADVIL,MOTRIN) 600 MG tablet Take 1 tablet (600 mg total) by mouth every 6 (six) hours as needed. 05/23/17   Arabella Merles, CNM  Prenatal Vit-Fe Fumarate-FA (PRENATAL VITAMIN) 27-0.8 MG TABS Take 1 tablet by mouth daily.    [provider]    Family History Family History  Problem Relation Age of Onset  . Cancer Mother        Breast Cancer  . Hypertension Mother   . Diabetes Mother   . Cancer Father   . Hypertension Father   . Diabetes Father   . Alcohol abuse Neg Hx   . Arthritis Neg Hx   . Asthma Neg Hx   . Birth defects Neg Hx   . COPD Neg Hx   . Depression Neg Hx   . Drug abuse Neg Hx   . Early death Neg Hx   . Heart disease Neg Hx   . Hearing loss Neg Hx   . Hyperlipidemia Neg Hx   .  Kidney disease Neg Hx   . Learning disabilities Neg Hx   . Mental illness Neg Hx   . Mental retardation Neg Hx   . Miscarriages / Stillbirths Neg Hx   . Stroke Neg Hx   . Vision loss Neg Hx   . Varicose Veins Neg Hx     Social History Social History  Substance Use Topics  . Smoking status: Former Smoker    Packs/day: 1.00    Types: Cigarettes    Quit date: 09/21/2016  . Smokeless tobacco: Never Used  . Alcohol use No     Allergies   Shrimp [shellfish allergy]   Review of Systems Review of Systems  Constitutional: Positive for fever.  Respiratory: Negative for cough and shortness of breath.   Genitourinary: Positive for dysuria and flank pain. Negative for difficulty urinating,  frequency, hematuria and urgency.  Musculoskeletal: Positive for myalgias.  Skin: Positive for color change.  Neurological: Positive for headaches.  All other systems reviewed and are negative.  Physical Exam Updated Vital Signs BP 113/70 (BP Location: Right Arm)   Pulse (!) 118   Temp (!) 101.3 F (38.5 C) (Oral)   Resp 18   LMP 08/23/2016 (Within Weeks)   SpO2 95%   Physical Exam  Constitutional: She appears well-developed and well-nourished.  HENT:  Head: Normocephalic.  Right Ear: External ear normal.  Left Ear: External ear normal.  Nose: Nose normal.  Mouth/Throat: Oropharynx is clear and moist.  Eyes: Conjunctivae are normal. Right eye exhibits no discharge. Left eye exhibits no discharge.  Neck: Normal range of motion.  Cardiovascular: Regular rhythm and normal heart sounds.  Tachycardia present.   No murmur heard. Pulmonary/Chest: Effort normal and breath sounds normal. No respiratory distress. She has no wheezes. She has no rales.  Right breast with erythema RUQ and laterally. Tender to touch. Consistent with mastitis.   Abdominal: Soft. She exhibits no distension. There is tenderness. There is no rebound, no guarding and no CVA tenderness.  TTP over the suprapubic and right abdomen.   Genitourinary:  Genitourinary Comments: Chaperone present throughout entire exam.   Musculoskeletal: Normal range of motion. She exhibits no edema or tenderness.  Neurological: She is alert. No cranial nerve deficit. Coordination normal.  Skin: Skin is warm and dry. No rash noted. No erythema. No pallor.  Psychiatric: She has a normal mood and affect. Her behavior is normal.  Nursing note and vitals reviewed.  ED Treatments / Results  DIAGNOSTIC STUDIES: Oxygen Saturation is 9% on RA, normal by my interpretation.   COORDINATION OF CARE: 8:51 PM-Discussed next steps with pt. Pt verbalized understanding and is agreeable with the plan.   Labs (all labs ordered are listed, but  only abnormal results are displayed) Labs Reviewed  COMPREHENSIVE METABOLIC PANEL - Abnormal; Notable for the following:       Result Value   Sodium 133 (*)    CO2 21 (*)    Glucose, Bld 132 (*)    Alkaline Phosphatase 140 (*)    All other components within normal limits  CBC WITH DIFFERENTIAL/PLATELET - Abnormal; Notable for the following:    WBC 12.7 (*)    Neutro Abs 11.8 (*)    All other components within normal limits  URINALYSIS, ROUTINE W REFLEX MICROSCOPIC - Abnormal; Notable for the following:    APPearance HAZY (*)    Hgb urine dipstick LARGE (*)    Protein, ur 30 (*)    Leukocytes, UA LARGE (*)    Squamous  Epithelial / LPF 0-5 (*)    All other components within normal limits  I-STAT CG4 LACTIC ACID, ED - Abnormal; Notable for the following:    Lactic Acid, Venous 2.12 (*)    All other components within normal limits  I-STAT BETA HCG BLOOD, ED ([facility], [facility], [facility] ONLY) - Abnormal; Notable for the following:    I-stat hCG, quantitative 12.1 (*)    All other components within normal limits  URINE CULTURE  I-STAT CG4 LACTIC ACID, ED   EKG  EKG Interpretation None      Radiology Dg Chest 2 View  Result Date: 05/31/2017 CLINICAL DATA:  unknown source of infection. Pt started running a fever of 102 degrees around 3pm today. She's been feeling pain in her right breast that goes through into her back. She's also been experiencing headache, and chills. Former smoker quit 09/21/2016 when she got pregnant EXAM: CHEST  2 VIEW COMPARISON:  None. FINDINGS: The heart size and mediastinal contours are within normal limits. Both lungs are clear. No pleural effusion or pneumothorax. The visualized skeletal structures are unremarkable. IMPRESSION: Normal chest radiographs. Electronically Signed   By: Amie Portlandavid  Ormond M.D.   On: 05/31/2017 20:39   Procedures Procedures   Medications Ordered in ED Medications  acetaminophen (TYLENOL) 160 MG/5ML solution (not administered)  acetaminophen  (TYLENOL) 325 MG tablet (not administered)  acetaminophen (TYLENOL) tablet 650 mg (650 mg Oral Given 05/31/17 2000)   Initial Impression / Assessment and Plan / ED Course  I have reviewed the triage vital signs and the nursing notes.  Pertinent labs & imaging results that were available during my care of the patient were reviewed by me and considered in my medical decision making (see chart for details).     35yF with several complaints. On exam has mastitis R breast and also like UTI/pyelo. No abscess. Consider retained products of conception but I think this is much less likely. Pain is much higher in flank and UA supportive. Pt declined placement of IV. Will place on keflex to cover for both mastitis and urine. Encouraged to continue pumping. Explained low threshold for returning if not getting better within 24 hours or worsening.   It has been determined that no acute conditions requiring further emergency intervention are present at this time. The patient has been advised of the diagnosis and plan. I reviewed any labs and imaging including any potential incidental findings. We have discussed signs and symptoms that warrant return to the ED and they are listed in the discharge instructions.    Final Clinical Impressions(s) / ED Diagnoses   Final diagnoses:  Mastitis, right, acute  Pyelonephritis   New Prescriptions New Prescriptions   No medications on file   I personally preformed the services scribed in my presence. The recorded information has been reviewed is accurate. Raeford RazorStephen Lelan Cush, MD.     Raeford RazorKohut, Dorothee Napierkowski, MD 06/01/17 [phone]

## 2017-05-31 NOTE — ED Triage Notes (Signed)
Pt presents to ED for assessment of unknown source of fever.   Pt is having headache, right flank pain, has recently been taking medicine for yeast, UTI and episiotomy repair.  Pt is currently breast feeding.  Pt denies cough.  C/o some light sensitivity and dizziness with HA.

## 2017-05-31 NOTE — ED Notes (Signed)
Pt verbalized understanding discharge instructions and denies any further needs or questions at this time. VS stable, ambulatory and steady gait.   

## 2017-05-31 NOTE — ED Notes (Signed)
Pt in xray

## 2017-05-31 NOTE — ED Notes (Signed)
Attempted PIV placement, pt declined to have IV, reports that she'll drink more water and that her pain has resolved.

## 2017-06-03 LAB — URINE CULTURE: Culture: >=100000 — AB

## 2017-06-04 ENCOUNTER — Telehealth: Payer: Self-pay | Admitting: Emergency Medicine

## 2017-06-04 NOTE — Telephone Encounter (Signed)
Post ED Visit - Positive Culture Follow-up  Culture report reviewed by antimicrobial stewardship pharmacist:  []  Enzo BiNathan Batchelder, Pharm.D. []  Celedonio MiyamotoJeremy Frens, Pharm.D., BCPS AQ-ID []  Garvin FilaMike Maccia, Pharm.D., BCPS []  Georgina PillionElizabeth Martin, [address]Pharm.D., BCPS []  [place]h Pham, [place]arm.D., BCPS, AAHIVP []  Estella HuskMichelle Turner, Pharm.D., BCPS, AAHIVP []  Lysle Pearlachel Rumbarger, PharmD, BCPS []  Casilda Carlsaylor Stone, PharmD, BCPS []  Pollyann SamplesAndy Johnston, PharmD, BCPS Sharin MonsEmily Sinclair PharmD  Positive urine culture Treated with cephalexin, organism sensitive to the same and no further patient follow-up is required at this time.  Berle MullMiller, Chrystopher Stangl 06/04/2017, 11:04 AM

## 2017-07-02 ENCOUNTER — Ambulatory Visit (INDEPENDENT_AMBULATORY_CARE_PROVIDER_SITE_OTHER): Payer: Self-pay | Admitting: Family Medicine

## 2017-07-02 NOTE — Progress Notes (Signed)
Subjective:     Belinda Mcintosh is a 36 y.o. female who presents for a postpartum visit. She is 6 weeks postpartum following a spontaneous vaginal delivery. I have fully reviewed the prenatal and intrapartum course. The delivery was at 41 gestational weeks. Outcome: spontaneous vaginal delivery. Anesthesia: epidural. Postpartum course has been normal. Baby's course has been normal. Baby is feeding by breast. Bleeding staining only. Bowel function is normal. Bladder function is normal. Patient is not sexually active. Contraception method is Depo-Provera injections. Postpartum depression screening: negative.  The following portions of the patient's history were reviewed and updated as appropriate: allergies, current medications, past family history, past medical history, past social history, past surgical history and problem list.  Review of Systems Pertinent items are noted in HPI.   Objective:    There were no vitals taken for this visit.  General:  alert, cooperative and no distress  Lungs: clear to auscultation bilaterally  Heart:  regular rate and rhythm, S1, S2 normal, no murmur, click, rub or gallop  Abdomen: soft, non-tender; bowel sounds normal; no masses,  no organomegaly   Vulva:  normal  Vagina: normal vagina, no discharge, exudate, lesion, or erythema        Assessment:     Normal postpartum exam. Pap smear not done at today's visit.   Plan:    1. Contraception: Depo-Provera injections 2. Follow up in: 1 year or as needed.

## 2017-08-23 IMAGING — CR DG CHEST 2V
2 series · 2 of 2 positions shown · non-contrast
Comparison: None.

CLINICAL DATA: unknown source of infection. Pt started running a
fever of 102 degrees around 3pm today. She's been feeling pain in
her right breast that goes through into her back. She's also been
experiencing headache, and chills. Former smoker quit 09/21/2016 when
she got pregnant

EXAM:
CHEST  2 VIEW

[chest pa]
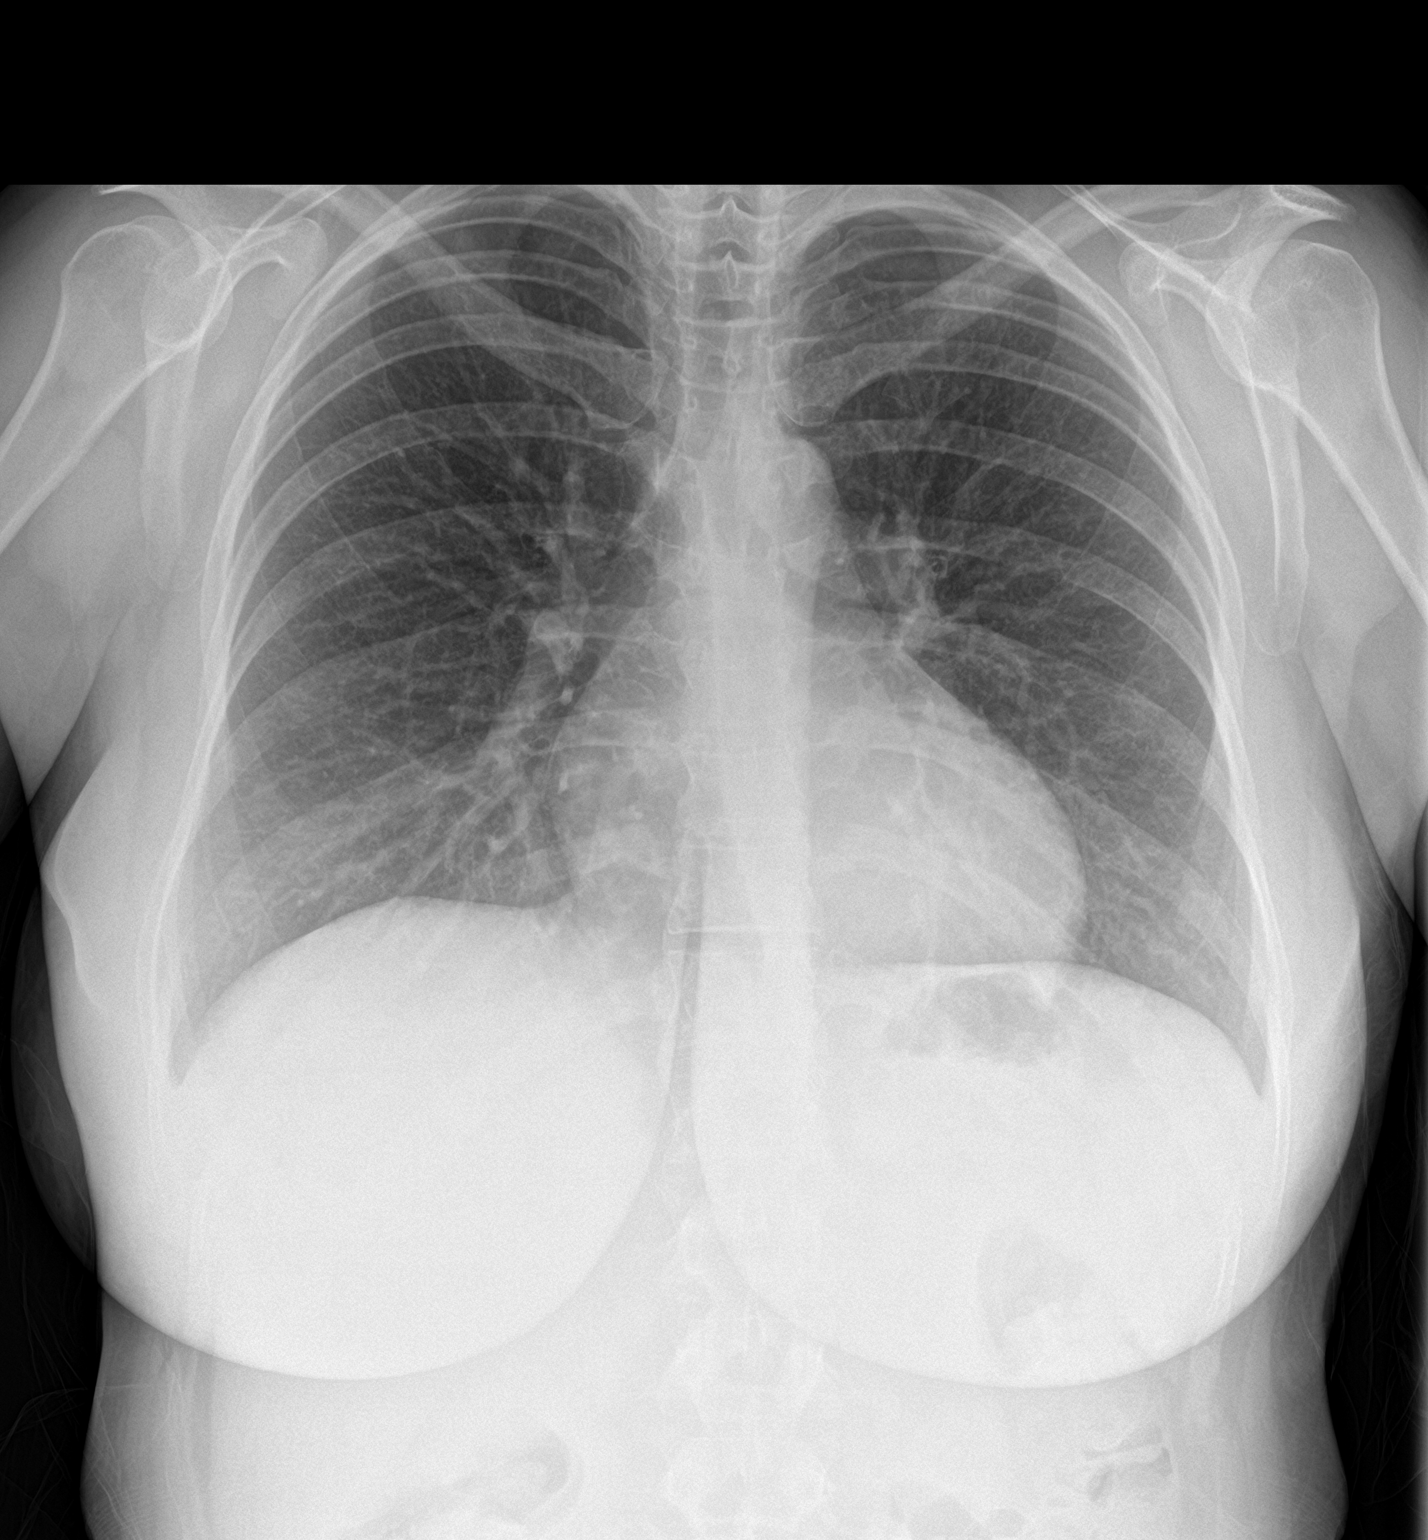

[chest lat]
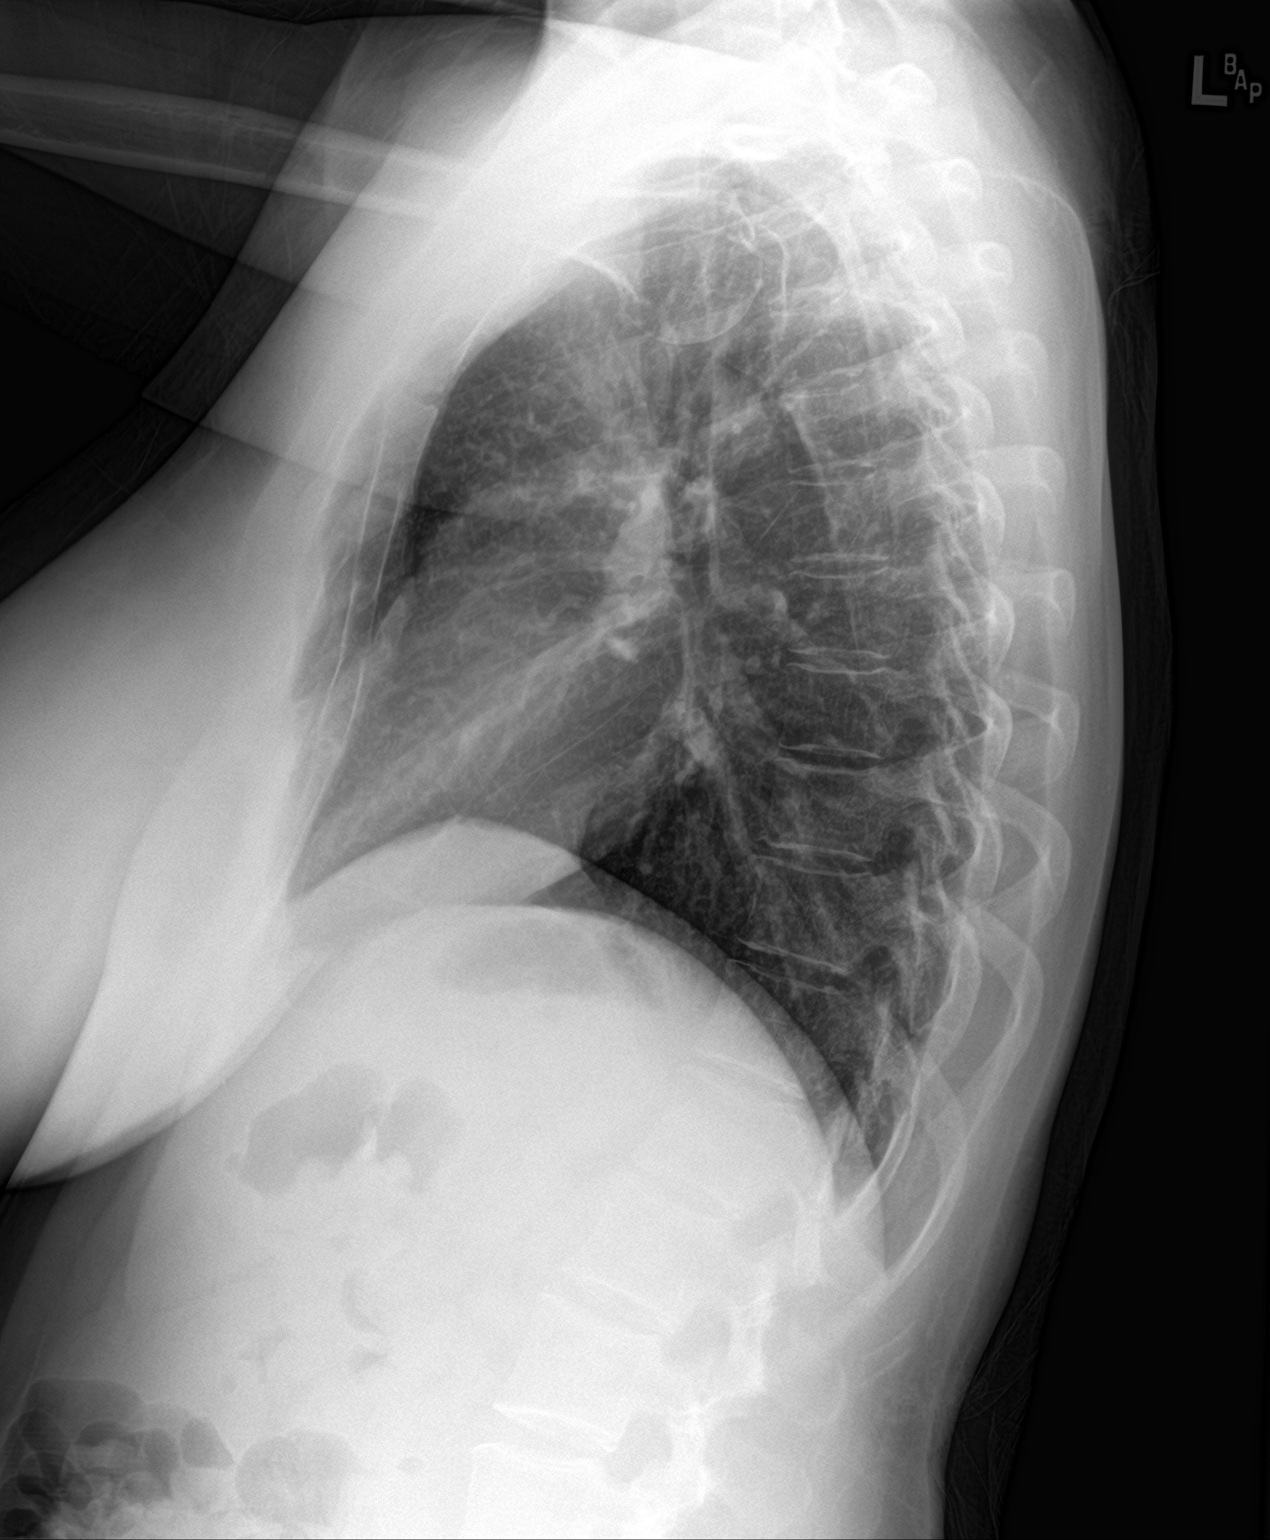

[2 of 2 positions shown; findings below may reference images not displayed]

FINDINGS: The heart size and mediastinal contours are within normal limits.
Both lungs are clear. No pleural effusion or pneumothorax. The
visualized skeletal structures are unremarkable.
IMPRESSION: Normal chest radiographs.

## 2018-12-27 DIAGNOSIS — N3020 Other chronic cystitis without hematuria: Secondary | ICD-10-CM | POA: Insufficient documentation

## 2020-03-16 ENCOUNTER — Ambulatory Visit (INDEPENDENT_AMBULATORY_CARE_PROVIDER_SITE_OTHER): Payer: 59 | Admitting: Registered Nurse

## 2020-03-16 ENCOUNTER — Encounter: Payer: Self-pay | Admitting: Registered Nurse

## 2020-03-16 ENCOUNTER — Other Ambulatory Visit: Payer: Self-pay

## 2020-03-16 VITALS — BP 100/62 | HR 71 | Temp 98.7°F | Ht 61.0 in | Wt 140.6 lb

## 2020-03-16 DIAGNOSIS — R5383 Other fatigue: Secondary | ICD-10-CM

## 2020-03-16 DIAGNOSIS — Z1329 Encounter for screening for other suspected endocrine disorder: Secondary | ICD-10-CM

## 2020-03-16 DIAGNOSIS — Z130 Encounter for screening for diseases of the blood and blood-forming organs and certain disorders involving the immune mechanism: Secondary | ICD-10-CM

## 2020-03-16 DIAGNOSIS — R9431 Abnormal electrocardiogram [ECG] [EKG]: Secondary | ICD-10-CM | POA: Diagnosis not present

## 2020-03-16 DIAGNOSIS — Z13228 Encounter for screening for other metabolic disorders: Secondary | ICD-10-CM

## 2020-03-16 DIAGNOSIS — L659 Nonscarring hair loss, unspecified: Secondary | ICD-10-CM | POA: Diagnosis not present

## 2020-03-16 DIAGNOSIS — Z0000 Encounter for general adult medical examination without abnormal findings: Secondary | ICD-10-CM

## 2020-03-16 NOTE — Patient Instructions (Signed)
° ° ° °  If you have lab work done today you will be contacted with your lab results within the next 2 weeks.  If you have not heard from us then please contact us. The fastest way to get your results is to register for My Chart. ° ° °IF you received an x-ray today, you will receive an invoice from [facility]. Please contact [facility] at [phone] with questions or concerns regarding your invoice.  ° °IF you received labwork today, you will receive an invoice from [facility]. Please contact [facility] at [phone] with questions or concerns regarding your invoice.  ° °Our billing staff will not be able to assist you with questions regarding bills from these companies. ° °You will be contacted with the lab results as soon as they are available. The fastest way to get your results is to activate your My Chart account. Instructions are located on the last page of this paperwork. If you have not heard from us regarding the results in 2 weeks, please contact this office. °  ° ° ° °

## 2020-03-17 LAB — COMPREHENSIVE METABOLIC PANEL
ALT: 17 IU/L (ref 0–32)
AST: 12 IU/L (ref 0–40)
Albumin/Globulin Ratio: 1.7 (ref 1.2–2.2)
Albumin: 4.7 g/dL (ref 3.8–4.8)
Alkaline Phosphatase: 70 IU/L (ref 39–117)
BUN/Creatinine Ratio: 13 (ref 9–23)
BUN: 9 mg/dL (ref 6–20)
Bilirubin Total: 0.3 mg/dL (ref 0.0–1.2)
CO2: 20 mmol/L (ref 20–29)
Calcium: 9.3 mg/dL (ref 8.7–10.2)
Chloride: 104 mmol/L (ref 96–106)
Creatinine, Ser: 0.71 mg/dL (ref 0.57–1.00)
GFR calc Af Amer: 125 mL/min/{1.73_m2} (ref >59)
GFR calc non Af Amer: 108 mL/min/{1.73_m2} (ref >59)
Globulin, Total: 2.8 g/dL (ref 1.5–4.5)
Glucose: 77 mg/dL (ref 65–99)
Potassium: 4 mmol/L (ref 3.5–5.2)
Sodium: 139 mmol/L (ref 134–144)
Total Protein: 7.5 g/dL (ref 6.0–8.5)

## 2020-03-17 LAB — CBC WITH DIFFERENTIAL/PLATELET
Basophils Absolute: 0 10*3/uL (ref 0.0–0.2)
Basos: 0 %
EOS (ABSOLUTE): 0.1 10*3/uL (ref 0.0–0.4)
Eos: 2 %
Hematocrit: 40.3 % (ref 34.0–46.6)
Hemoglobin: 13.7 g/dL (ref 11.1–15.9)
Immature Grans (Abs): 0 10*3/uL (ref 0.0–0.1)
Immature Granulocytes: 0 %
Lymphocytes Absolute: 1.4 10*3/uL (ref 0.7–3.1)
Lymphs: 25 %
MCH: 30.5 pg (ref 26.6–33.0)
MCHC: 34 g/dL (ref 31.5–35.7)
MCV: 90 fL (ref 79–97)
Monocytes Absolute: 0.3 10*3/uL (ref 0.1–0.9)
Monocytes: 6 %
Neutrophils Absolute: 3.8 10*3/uL (ref 1.4–7.0)
Neutrophils: 67 %
Platelets: 117 10*3/uL — ABNORMAL LOW (ref 150–450)
RBC: 4.49 x10E6/uL (ref 3.77–5.28)
RDW: 12.8 % (ref 11.7–15.4)
WBC: 5.7 10*3/uL (ref 3.4–10.8)

## 2020-03-17 LAB — IRON,TIBC AND FERRITIN PANEL
Ferritin: 60 ng/mL (ref 15–150)
Iron Saturation: 14 % — ABNORMAL LOW (ref 15–55)
Iron: 45 ug/dL (ref 27–159)
Total Iron Binding Capacity: 313 ug/dL (ref 250–450)
UIBC: 268 ug/dL (ref 131–425)

## 2020-03-17 LAB — TSH: TSH: 1.38 u[IU]/mL (ref 0.450–4.500)

## 2020-03-17 LAB — VITAMIN D 25 HYDROXY (VIT D DEFICIENCY, FRACTURES): Vit D, 25-Hydroxy: 24.9 ng/mL — ABNORMAL LOW (ref 30.0–100.0)

## 2020-03-17 LAB — VITAMIN B12: Vitamin B-12: 412 pg/mL (ref 232–1245)

## 2020-03-25 ENCOUNTER — Other Ambulatory Visit: Payer: Self-pay | Admitting: Registered Nurse

## 2020-03-25 ENCOUNTER — Encounter: Payer: Self-pay | Admitting: Radiology

## 2020-03-25 DIAGNOSIS — R109 Unspecified abdominal pain: Secondary | ICD-10-CM

## 2020-03-25 NOTE — Progress Notes (Signed)
Good afternoon,  Letter, please. Pt should start an OTC vit d supplement, otherwise, no concerns  Thank you  Jari Sportsman, NP

## 2020-04-06 ENCOUNTER — Other Ambulatory Visit: Payer: Self-pay

## 2020-04-06 ENCOUNTER — Ambulatory Visit
Admission: RE | Admit: 2020-04-06 | Discharge: 2020-04-06 | Disposition: A | Discharge status: Home / Self Care | Payer: 59 | Source: Ambulatory Visit | Attending: Registered Nurse | Admitting: Registered Nurse

## 2020-04-06 DIAGNOSIS — R109 Unspecified abdominal pain: Secondary | ICD-10-CM

## 2020-04-18 ENCOUNTER — Encounter: Payer: Self-pay | Admitting: Registered Nurse

## 2020-04-18 NOTE — Progress Notes (Signed)
Established Patient Office Visit  Subjective:  Patient ID: Belinda Mcintosh, female    DOB: 06/22/1981  Age: 39 y.o. MRN: [id number]  CC:  Chief Complaint  Patient presents with  . Establish Care    Hx of kidney stone and having trouble with back pain. Back pain for 1-2 month. Had labs done at [facility] on 01/19/20.Patient also would like to discuss pap and mammo. Last pap was in 2018.Husband also have HPV and would like to discuss I need to have this done as well.    HPI Belinda Mcintosh presents for visit to establish care.  Biggest concern today is some flank pain for 1-2 mos. Has hx of renal stone. No urinary symptoms at this time, no nvd fevers chills fatigue.  Labs were completed at [facility] on 01/19/20. Needs pap and mammo - routine  Otherwise, feeling well and without further complaint  Past Medical History:  Diagnosis Date  . Umbilical hernia 2018    Past Surgical History:  Procedure Laterality Date  . COSMETIC SURGERY    . INDUCED ABORTION    . LIPOSUCTION    . NOSE SURGERY      Family History  Problem Relation Age of Onset  . Hypertension Mother   . Diabetes Mother   . Breast cancer Mother        Breast Cancer  . Cancer Father   . Hypertension Father   . Diabetes Father   . Cancer Sister   . Alcohol abuse Neg Hx   . Arthritis Neg Hx   . Asthma Neg Hx   . COPD Neg Hx   . Depression Neg Hx   . Drug abuse Neg Hx   . Early death Neg Hx   . Heart disease Neg Hx   . Hearing loss Neg Hx   . Hyperlipidemia Neg Hx   . Kidney disease Neg Hx   . Learning disabilities Neg Hx   . Mental illness Neg Hx   . Mental retardation Neg Hx   . Miscarriages / Stillbirths Neg Hx   . Stroke Neg Hx   . Vision loss Neg Hx   . Varicose Veins Neg Hx     Social History   Socioeconomic History  . Marital status: Married    Spouse name: Not on file  . Number of children: 1  . Years of education: Not on file  . Highest education level: Not on file  Occupational History  .  Not on file  Tobacco Use  . Smoking status: Former Smoker    Packs/day: 1.00    Types: Cigarettes    Quit date: 09/21/2016    Years since quitting: 3.5  . Smokeless tobacco: Never Used  Substance and Sexual Activity  . Alcohol use: No  . Drug use: No  . Sexual activity: Yes  Other Topics Concern  . Not on file  Social History Narrative   ** Merged History Encounter **       Social Determinants of Health   Financial Resource Strain:   . Difficulty of Paying Living Expenses:   Food Insecurity:   . Worried About Programme researcher, broadcasting/film/video in the Last Year:   . Barista in the Last Year:   Transportation Needs:   . Freight forwarder (Medical):   Marland Kitchen Lack of Transportation (Non-Medical):   Physical Activity:   . Days of Exercise per Week:   . Minutes of Exercise per Session:   Stress:   .  Feeling of Stress :   Social Connections:   . Frequency of Communication with Friends and Family:   . Frequency of Social Gatherings with Friends and Family:   . Attends Religious Services:   . Active Member of Clubs or Organizations:   . Attends Archivist Meetings:   Marland Kitchen Marital Status:   Intimate Partner Violence:   . Fear of Current or Ex-Partner:   . Emotionally Abused:   Marland Kitchen Physically Abused:   . Sexually Abused:     Outpatient Medications Prior to Visit  Medication Sig Dispense Refill  . ibuprofen (ADVIL,MOTRIN) 200 MG tablet Take 400 mg by mouth every 6 (six) hours as needed (for pain).    Marland Kitchen docusate sodium (COLACE) 100 MG capsule Take 1 capsule (100 mg total) by mouth 2 (two) times daily. 60 capsule 0  . Prenatal Vit-Fe Fumarate-FA (PRENATAL VITAMIN) 27-0.8 MG TABS Take 1 tablet by mouth daily.     No facility-administered medications prior to visit.    Allergies  Allergen Reactions  . Shrimp [Shellfish Allergy] Itching    ROS Review of Systems  Constitutional: Negative.   HENT: Negative.   Eyes: Negative.   Respiratory: Negative.   Cardiovascular:  Negative.   Gastrointestinal: Negative.   Endocrine: Negative.   Genitourinary: Negative.   Musculoskeletal: Negative.   Skin: Negative.   Allergic/Immunologic: Negative.   Neurological: Negative.   Hematological: Negative.   Psychiatric/Behavioral: Negative.   All other systems reviewed and are negative.     Objective:    Physical Exam  Constitutional: She is oriented to person, place, and time. She appears well-developed and well-nourished. No distress.  Cardiovascular: Normal rate, regular rhythm and normal heart sounds. Exam reveals no gallop and no friction rub.  No murmur heard. Pulmonary/Chest: Effort normal and breath sounds normal. No respiratory distress. She has no wheezes. She has no rales. She exhibits no tenderness.  Abdominal: Soft. Bowel sounds are normal. She exhibits no distension. There is no abdominal tenderness. There is no rebound and no guarding.  Neurological: She is alert and oriented to person, place, and time.  Skin: Skin is warm and dry. No rash noted. She is not diaphoretic. No erythema. No pallor.  Psychiatric: She has a normal mood and affect. Her behavior is normal. Judgment and thought content normal.  Nursing note and vitals reviewed.   BP 100/62 (BP Location: Right Arm, Patient Position: Sitting, Cuff Size: Normal)   Pulse 71   Temp 98.7 F (37.1 C) (Temporal)   Ht 5\' 1"  (1.549 m)   Wt 140 lb 9.6 oz (63.8 kg)   SpO2 98%   BMI 26.57 kg/m  Wt Readings from Last 3 Encounters:  03/16/20 140 lb 9.6 oz (63.8 kg)  07/02/17 140 lb 14.4 oz (63.9 kg)  05/20/17 163 lb (73.9 kg)     Health Maintenance Due  Topic Date Due  . COVID-19 Vaccine (1) Never done  . PAP SMEAR-Modifier  05/09/2020    There are no preventive care reminders to display for this patient.  Lab Results  Component Value Date   TSH 1.380 03/16/2020   Lab Results  Component Value Date   WBC 5.7 03/16/2020   HGB 13.7 03/16/2020   HCT 40.3 03/16/2020   MCV 90 03/16/2020    PLT 117 (L) 03/16/2020   Lab Results  Component Value Date   NA 139 03/16/2020   K 4.0 03/16/2020   CO2 20 03/16/2020   GLUCOSE 77 03/16/2020   BUN 9  03/16/2020   CREATININE 0.71 03/16/2020   BILITOT 0.3 03/16/2020   ALKPHOS 70 03/16/2020   AST 12 03/16/2020   ALT 17 03/16/2020   PROT 7.5 03/16/2020   ALBUMIN 4.7 03/16/2020   CALCIUM 9.3 03/16/2020   ANIONGAP 9 05/31/2017   No results found for: CHOL No results found for: HDL No results found for: LDLCALC No results found for: TRIG No results found for: CHOLHDL No results found for: AYOK5T    Assessment & Plan:   Problem List Items Addressed This Visit    None    Visit Diagnoses    Routine general medical examination at a health care facility    -  Primary   Screening for endocrine, metabolic and immunity disorder       Relevant Orders   TSH   CBC with Differential   Comprehensive metabolic panel   Hair loss       Relevant Orders   Vitamin D, 25-hydroxy (Completed)   Vitamin B12 (Completed)   Iron, TIBC and Ferritin Panel (Completed)   Fatigue, unspecified type       Relevant Orders   Vitamin D, 25-hydroxy (Completed)   Vitamin B12 (Completed)   Iron, TIBC and Ferritin Panel (Completed)   Abnormal EKG       Relevant Orders   EKG 12-Lead (Completed)      No orders of the defined types were placed in this encounter.   Follow-up: No follow-ups on file.   PLAN  Labs collected, will follow up as warranted  Will consider ordering CT for renal stone  Return for CPE and pap  Patient encouraged to call clinic with any questions, comments, or concerns.  [name], NP

## 2020-04-30 ENCOUNTER — Encounter: Payer: Self-pay | Admitting: Registered Nurse

## 2020-04-30 ENCOUNTER — Other Ambulatory Visit: Payer: Self-pay

## 2020-04-30 ENCOUNTER — Ambulatory Visit (INDEPENDENT_AMBULATORY_CARE_PROVIDER_SITE_OTHER): Payer: 59 | Admitting: Registered Nurse

## 2020-04-30 VITALS — BP 106/63 | HR 74 | Temp 98.7°F | Resp 14 | Ht 61.0 in | Wt 142.2 lb

## 2020-04-30 DIAGNOSIS — R0989 Other specified symptoms and signs involving the circulatory and respiratory systems: Secondary | ICD-10-CM | POA: Diagnosis not present

## 2020-04-30 DIAGNOSIS — R109 Unspecified abdominal pain: Secondary | ICD-10-CM | POA: Diagnosis not present

## 2020-04-30 DIAGNOSIS — Z124 Encounter for screening for malignant neoplasm of cervix: Secondary | ICD-10-CM | POA: Diagnosis not present

## 2020-04-30 DIAGNOSIS — J4520 Mild intermittent asthma, uncomplicated: Secondary | ICD-10-CM

## 2020-04-30 DIAGNOSIS — R0789 Other chest pain: Secondary | ICD-10-CM

## 2020-04-30 LAB — POCT URINALYSIS DIP (CLINITEK)
Bilirubin, UA: NEGATIVE
Blood, UA: NEGATIVE
Glucose, UA: NEGATIVE mg/dL
Ketones, POC UA: NEGATIVE mg/dL
Leukocytes, UA: NEGATIVE
Nitrite, UA: NEGATIVE
POC PROTEIN,UA: NEGATIVE
Spec Grav, UA: >=1.03 — AB (ref 1.010–1.025)
Urobilinogen, UA: 0.2 E.U./dL
pH, UA: 6.5 (ref 5.0–8.0)

## 2020-04-30 MED ORDER — ESOMEPRAZOLE MAGNESIUM 40 MG PO CPDR
40.0000 mg | DELAYED_RELEASE_CAPSULE | Freq: Every day | ORAL | 3 refills | Status: DC
Start: 2020-04-30 — End: 2020-07-16

## 2020-04-30 MED ORDER — ALBUTEROL SULFATE HFA 108 (90 BASE) MCG/ACT IN AERS: 2.0000 | INHALATION_SPRAY | Freq: Four times a day (QID) | RESPIRATORY_TRACT | 3 refills | Status: DC | PRN

## 2020-04-30 NOTE — Patient Instructions (Signed)
° ° ° °  If you have lab work done today you will be contacted with your lab results within the next 2 weeks.  If you have not heard from us then please contact us. The fastest way to get your results is to register for My Chart. ° ° °IF you received an x-ray today, you will receive an invoice from [facility]. Please contact [facility] at [phone] with questions or concerns regarding your invoice.  ° °IF you received labwork today, you will receive an invoice from [facility]. Please contact [facility] at [phone] with questions or concerns regarding your invoice.  ° °Our billing staff will not be able to assist you with questions regarding bills from these companies. ° °You will be contacted with the lab results as soon as they are available. The fastest way to get your results is to activate your My Chart account. Instructions are located on the last page of this paperwork. If you have not heard from us regarding the results in 2 weeks, please contact this office. °  ° ° ° °

## 2020-04-30 NOTE — Progress Notes (Signed)
Established Patient Office Visit  Subjective:  Patient ID: Belinda Mcintosh, female    DOB: [birth year]  Age: 39 y.o. MRN: [id number]  CC:  Chief Complaint  Patient presents with  . Flank Pain    pt is doing a little better but not 100 % as of yet.   Marland Kitchen Referral    pt requesting a GYN referral for Pap and potential for HPV as husband has reacently diagnosed  . Shortness of Breath    pt is still expereiencing SOB esspecially with exertion, pt complained of this symptoms last visit and notes it is still present     HPI Belinda Mcintosh presents for concern for irregular heartbeat and shortness of breath Happens episodically without clear provocation. 1-2 times per day. Does not feel lightheaded. Can easily catch her breath. Heart returns to normal rate rapidly. No swelling of legs, visual changes, claudication, or headaches. No known history of cardiac abnormalities.  Labs reviewed with patient - mild vit d deficiency, should start OTC supplement. Otherwise, labs not concerning.  Lower back pain improving. CT did not show stone. POCT UA does not show infection. Suspect msk and encourage continued stretching and otc analgesic use.   Requests referral to Gyn for pap and mammo.  No other concerns.   Past Medical History:  Diagnosis Date  . Umbilical hernia 2018    Past Surgical History:  Procedure Laterality Date  . COSMETIC SURGERY    . INDUCED ABORTION    . LIPOSUCTION    . NOSE SURGERY      Family History  Problem Relation Age of Onset  . Hypertension Mother   . Diabetes Mother   . Breast cancer Mother        Breast Cancer  . Cancer Father   . Hypertension Father   . Diabetes Father   . Cancer Sister   . Alcohol abuse Neg Hx   . Arthritis Neg Hx   . Asthma Neg Hx   . COPD Neg Hx   . Depression Neg Hx   . Drug abuse Neg Hx   . Early death Neg Hx   . Heart disease Neg Hx   . Hearing loss Neg Hx   . Hyperlipidemia Neg Hx   . Kidney disease Neg Hx   . Learning  disabilities Neg Hx   . Mental illness Neg Hx   . Mental retardation Neg Hx   . Miscarriages / Stillbirths Neg Hx   . Stroke Neg Hx   . Vision loss Neg Hx   . Varicose Veins Neg Hx     Social History   Socioeconomic History  . Marital status: Married    Spouse name: Not on file  . Number of children: 1  . Years of education: Not on file  . Highest education level: Not on file  Occupational History  . Not on file  Tobacco Use  . Smoking status: Former Smoker    Packs/day: 1.00    Types: Cigarettes    Quit date: 09/21/2016    Years since quitting: 3.6  . Smokeless tobacco: Never Used  Vaping Use  . Vaping Use: Never used  Substance and Sexual Activity  . Alcohol use: No  . Drug use: No  . Sexual activity: Yes  Other Topics Concern  . Not on file  Social History Narrative   ** Merged History Encounter **       Social Determinants of Health   Financial Resource Strain:   .  Difficulty of Paying Living Expenses:   Food Insecurity:   . Worried About Programme researcher, broadcasting/film/video in the Last Year:   . Barista in the Last Year:   Transportation Needs:   . Freight forwarder (Medical):   Marland Kitchen Lack of Transportation (Non-Medical):   Physical Activity:   . Days of Exercise per Week:   . Minutes of Exercise per Session:   Stress:   . Feeling of Stress :   Social Connections:   . Frequency of Communication with Friends and Family:   . Frequency of Social Gatherings with Friends and Family:   . Attends Religious Services:   . Active Member of Clubs or Organizations:   . Attends Banker Meetings:   Marland Kitchen Marital Status:   Intimate Partner Violence:   . Fear of Current or Ex-Partner:   . Emotionally Abused:   Marland Kitchen Physically Abused:   . Sexually Abused:     Outpatient Medications Prior to Visit  Medication Sig Dispense Refill  . ibuprofen (ADVIL,MOTRIN) 200 MG tablet Take 400 mg by mouth every 6 (six) hours as needed (for pain).     No facility-administered  medications prior to visit.    Allergies  Allergen Reactions  . Shrimp [Shellfish Allergy] Itching    ROS Review of Systems  Constitutional: Negative.   HENT: Negative.   Eyes: Negative.   Respiratory: Positive for chest tightness. Negative for apnea, cough, choking, shortness of breath, wheezing and stridor.   Cardiovascular: Positive for palpitations. Negative for chest pain and leg swelling.  Gastrointestinal: Negative.   Endocrine: Negative.   Genitourinary: Negative.   Musculoskeletal: Negative.   Skin: Negative.   Allergic/Immunologic: Negative.   Neurological: Negative.   Hematological: Negative.   Psychiatric/Behavioral: Negative.   All other systems reviewed and are negative.     Objective:    Physical Exam Vitals and nursing note reviewed.  Constitutional:      Appearance: She is well-developed and normal weight.  Cardiovascular:     Rate and Rhythm: Normal rate and regular rhythm.     Heart sounds: Normal heart sounds.  Pulmonary:     Effort: Pulmonary effort is normal.     Breath sounds: Normal breath sounds. No decreased breath sounds.  Skin:    General: Skin is warm and dry.  Neurological:     General: No focal deficit present.     Mental Status: She is alert and oriented to person, place, and time.     Cranial Nerves: No cranial nerve deficit.     Motor: No weakness.  Psychiatric:        Mood and Affect: Mood is anxious.        Behavior: Behavior normal. Behavior is not agitated.     BP 106/63   Pulse 74   Temp 98.7 F (37.1 C) (Temporal)   Resp 14   Ht 5\' 1"  (1.549 m)   Wt 142 lb 3.2 oz (64.5 kg)   SpO2 95%   BMI 26.87 kg/m  Wt Readings from Last 3 Encounters:  04/30/20 142 lb 3.2 oz (64.5 kg)  03/16/20 140 lb 9.6 oz (63.8 kg)  07/02/17 140 lb 14.4 oz (63.9 kg)     Health Maintenance Due  Topic Date Due  . Hepatitis C Screening  Never done  . PAP SMEAR-Modifier  05/09/2020    There are no preventive care reminders to display  for this patient.  Lab Results  Component Value Date  TSH 1.380 03/16/2020   Lab Results  Component Value Date   WBC 5.7 03/16/2020   HGB 13.7 03/16/2020   HCT 40.3 03/16/2020   MCV 90 03/16/2020   PLT 117 (L) 03/16/2020   Lab Results  Component Value Date   NA 139 03/16/2020   K 4.0 03/16/2020   CO2 20 03/16/2020   GLUCOSE 77 03/16/2020   BUN 9 03/16/2020   CREATININE 0.71 03/16/2020   BILITOT 0.3 03/16/2020   ALKPHOS 70 03/16/2020   AST 12 03/16/2020   ALT 17 03/16/2020   PROT 7.5 03/16/2020   ALBUMIN 4.7 03/16/2020   CALCIUM 9.3 03/16/2020   ANIONGAP 9 05/31/2017   No results found for: CHOL No results found for: HDL No results found for: LDLCALC No results found for: TRIG No results found for: CHOLHDL No results found for: HGBA1C    Assessment & Plan:   Problem List Items Addressed This Visit    None    Visit Diagnoses    Flank pain    -  Primary   Relevant Orders   POCT URINALYSIS DIP (CLINITEK) (Completed)   Papanicolaou smear       Relevant Orders   Ambulatory referral to Gynecology   Globus sensation       Relevant Medications   esomeprazole (NEXIUM) 40 MG capsule   Mild intermittent asthma, unspecified whether complicated       Relevant Medications   albuterol (VENTOLIN HFA) 108 (90 Base) MCG/ACT inhaler   Chest tightness       Relevant Orders   Ambulatory referral to Cardiology      Meds ordered this encounter  Medications  . esomeprazole (NEXIUM) 40 MG capsule    Sig: Take 1 capsule (40 mg total) by mouth daily.    Dispense:  30 capsule    Refill:  3    Order Specific Question:   Supervising Provider    AnswerPamella Pert, Lilia Argue [[id number]]  . albuterol (VENTOLIN HFA) 108 (90 Base) MCG/ACT inhaler    Sig: Inhale 2 puffs into the lungs every 6 (six) hours as needed for wheezing or shortness of breath.    Dispense:  18 g    Refill:  3    Order Specific Question:   Supervising Provider    AnswerRutherford Guys [[id number]]     Follow-up: No follow-ups on file.   PLAN  Will try esomeprazole and albuterol for episodic shob. Suspect silent gerd but pt does not agree - is still concerned for cardiac etiology. She may benefit from a monitor. Will place referral.  Labs provide no indication of etiology. POCT UA today normal. Some thickening of wall of bladder noted on CT - will continue to monitor for symptoms of cystitis.  Return PRN  Patient encouraged to call clinic with any questions, comments, or concerns.  Maximiano Coss, NP

## 2020-05-11 NOTE — Progress Notes (Deleted)
Cardiology Office Note   Date:  05/11/2020   ID:  Belinda Mcintosh, DOB Sep 04, 1981, MRN [id number]  PCP:  [name], NP  Cardiologist:   No primary care provider on file. Referring:  ***  No chief complaint on file.     History of Present Illness: Belinda Mcintosh is a 39 y.o. female who is referred by *** for evaluation of chest pain.  ***    Past Medical History:  Diagnosis Date  . Abdominal pain   . Flank pain   . History of kidney stones   . Low back pain   . Palpitations   . Umbilical hernia 2018    Past Surgical History:  Procedure Laterality Date  . COSMETIC SURGERY    . INDUCED ABORTION    . LIPOSUCTION    . NOSE SURGERY       Current Outpatient Medications  Medication Sig Dispense Refill  . albuterol (VENTOLIN HFA) 108 (90 Base) MCG/ACT inhaler Inhale 2 puffs into the lungs every 6 (six) hours as needed for wheezing or shortness of breath. 18 g 3  . esomeprazole (NEXIUM) 40 MG capsule Take 1 capsule (40 mg total) by mouth daily. 30 capsule 3  . ibuprofen (ADVIL,MOTRIN) 200 MG tablet Take 400 mg by mouth every 6 (six) hours as needed (for pain).     No current facility-administered medications for this visit.    Allergies:   Shrimp [shellfish allergy]    Social History:  The patient  reports that she quit smoking about 3 years ago. Her smoking use included cigarettes. She smoked 1.00 pack per day. She has never used smokeless tobacco. She reports that she does not drink alcohol and does not use drugs.   Family History:  The patient's ***family history includes Breast cancer in her mother; Cancer in her father and sister; Diabetes in her father and mother; Hypertension in her father and mother.    ROS:  Please see the history of present illness.   Otherwise, review of systems are positive for {NONE DEFAULTED:18576::"none"}.   All other systems are reviewed and negative.    PHYSICAL EXAM: VS:  There were no vitals taken for this visit. , BMI  There is no height or weight on file to calculate BMI. GENERAL:  Well appearing HEENT:  Pupils equal round and reactive, fundi not visualized, oral mucosa unremarkable NECK:  No jugular venous distention, waveform within normal limits, carotid upstroke brisk and symmetric, no bruits, no thyromegaly LYMPHATICS:  No cervical, inguinal adenopathy LUNGS:  Clear to auscultation bilaterally BACK:  No CVA tenderness CHEST:  Unremarkable HEART:  PMI not displaced or sustained,S1 and S2 within normal limits, no S3, no S4, no clicks, no rubs, *** murmurs ABD:  Flat, positive bowel sounds normal in frequency in pitch, no bruits, no rebound, no guarding, no midline pulsatile mass, no hepatomegaly, no splenomegaly EXT:  2 plus pulses throughout, no edema, no cyanosis no clubbing SKIN:  No rashes no nodules NEURO:  Cranial nerves II through XII grossly intact, motor grossly intact throughout PSYCH:  Cognitively intact, oriented to person place and time    EKG:  EKG {ACTION; IS/IS SJG:[id number]} ordered today. The ekg ordered today demonstrates ***   Recent Labs: 03/16/2020: ALT 17; BUN 9; Creatinine, Ser 0.71; Hemoglobin 13.7; Platelets 117; Potassium 4.0; Sodium 139; TSH 1.380    Lipid Panel No results found for: CHOL, TRIG, HDL, CHOLHDL, VLDL, LDLCALC, LDLDIRECT    Wt Readings from Last 3 Encounters:  04/30/20 142 lb 3.2 oz (64.5 kg)  03/16/20 140 lb 9.6 oz (63.8 kg)  07/02/17 140 lb 14.4 oz (63.9 kg)      Other studies Reviewed: Additional studies/ records that were reviewed today include: ***. Review of the above records demonstrates:  Please see elsewhere in the note.  ***   ASSESSMENT AND PLAN:  ***   Current medicines are reviewed at length with the patient today.  The patient {ACTIONS; HAS/DOES NOT HAVE:19233} concerns regarding medicines.  The following changes have been made:  {PLAN; NO CHANGE:13088:s}  Labs/ tests ordered today include: *** No orders of the defined types  were placed in this encounter.    Disposition:   FU with ***    Signed, Rollene Rotunda, MD  05/11/2020 9:36 PM    [facility]

## 2020-05-12 ENCOUNTER — Ambulatory Visit: Payer: 59 | Admitting: Cardiology

## 2020-06-25 ENCOUNTER — Ambulatory Visit: Payer: 59 | Admitting: Cardiology

## 2020-06-28 ENCOUNTER — Ambulatory Visit: Payer: 59 | Admitting: Cardiology

## 2020-07-15 ENCOUNTER — Encounter: Payer: Self-pay | Admitting: Cardiovascular Disease

## 2020-07-15 NOTE — Progress Notes (Signed)
Cardiology Office Note:    Date:  07/16/2020   ID:  Belinda Mcintosh, DOB 05/06/1981, MRN [id number]  PCP:  Belinda Mcintosh  [facility] Cardiologist:  Belinda Mcintosh [facility] Electrophysiologist:  None   Referring MD: Belinda Mcintosh   Chief Complaint  Patient presents with   Chest Pain    Sept. 3, 2021   Belinda Mcintosh is a 39 y.o. female with a hx of chest pain.  We were asked to see her by Dr. Kateri PlummerMorrow for further episodes of these chest pain   Pt was seen by her PCP in June, 2021 Complains of irreg HR and dyspnea  Has had worsening anxiety for the past several years  She reduced her coffee intake.   She developed some palpitations and very brief episodes of shortness of breath  Went to her primary  Gave her some anxiety meds.   These occurrarely , perhaps 1-2 times / month Is able to do her normal activities .  exersicises regularly - runs on the treadmill, for 1 hr a day, almost daily  Has 1 child   Has occasional episodes of stress which causes some burning in her face.   Past Medical History:  Diagnosis Date   Abdominal pain    Flank pain    History of kidney stones    Low back pain    Palpitations    Umbilical hernia 2018    Past Surgical History:  Procedure Laterality Date   COSMETIC SURGERY     INDUCED ABORTION     LIPOSUCTION     NOSE SURGERY      Current Medications: Current Meds  Medication Sig   IBUPROFEN & ACETAMINOPHEN PO Take 1 tablet by mouth as needed.   Multiple Vitamin (MULTIVITAMIN) tablet Take 1 tablet by mouth daily.   [DISCONTINUED] albuterol (VENTOLIN HFA) 108 (90 Base) MCG/ACT inhaler Inhale 2 puffs into the lungs every 6 (six) hours as needed for wheezing or shortness of breath.   [DISCONTINUED] esomeprazole (NEXIUM) 40 MG capsule Take 1 capsule (40 mg total) by mouth daily.     Allergies:   Shrimp [shellfish allergy]   Social History   Socioeconomic History   Marital status: Married    Spouse  name: Not on file   Number of children: 1   Years of education: Not on file   Highest education level: Not on file  Occupational History   Not on file  Tobacco Use   Smoking status: Former Smoker    Packs/day: 1.00    Types: Cigarettes    Quit date: 09/21/2016    Years since quitting: 3.8   Smokeless tobacco: Never Used  Vaping Use   Vaping Use: Never used  Substance and Sexual Activity   Alcohol use: No   Drug use: No   Sexual activity: Yes  Other Topics Concern   Not on file  Social History Narrative   ** Merged History Encounter **       Social Determinants of Health   Financial Resource Strain:    Difficulty of Paying Living Expenses: Not on file  Food Insecurity:    Worried About Programme researcher, broadcasting/film/videounning Out of Food in the Last Year: Not on file   [employer] Out of Food in the Last Year: Not on file  Transportation Needs:    Lack of Transportation (Medical): Not on file   Lack of Transportation (Non-Medical): Not on file  Physical Activity:    Days of Exercise per Week: Not on  file   Minutes of Exercise per Session: Not on file  Stress:    Feeling of Stress : Not on file  Social Connections:    Frequency of Communication with Friends and Family: Not on file   Frequency of Social Gatherings with Friends and Family: Not on file   Attends Religious Services: Not on file   Active Member of Clubs or Organizations: Not on file   Attends Banker Meetings: Not on file   Marital Status: Not on file     Family History: The patient's family history includes Breast cancer in her mother; Cancer in her father and sister; Diabetes in her father and mother; Hypertension in her father and mother. There is no history of Alcohol abuse, Arthritis, Asthma, COPD, Depression, Drug abuse, Early death, Heart disease, Hearing loss, Hyperlipidemia, Kidney disease, Learning disabilities, Mental illness, Mental retardation, Miscarriages / Stillbirths, Stroke, Vision loss, or  Varicose Veins.  ROS:   Please see the history of present illness.     All other systems reviewed and are negative.  EKGs/Labs/Other Studies Reviewed:    The following studies were reviewed today:   EKG:   Sept. 3, 2021:   nsr AT 74.   NO ST or T wave changes.   Recent Labs: 03/16/2020: ALT 17; BUN 9; Creatinine, Ser 0.71; Hemoglobin 13.7; Platelets 117; Potassium 4.0; Sodium 139; TSH 1.380  Recent Lipid Panel No results found for: CHOL, TRIG, HDL, CHOLHDL, VLDL, LDLCALC, LDLDIRECT  Physical Exam:    VS:  BP 102/68    Pulse 74    Ht 5\' 1"  (1.549 m)    Wt 139 lb 3.2 oz (63.1 kg)    SpO2 95%    BMI 26.30 kg/m     Wt Readings from Last 3 Encounters:  07/16/20 139 lb 3.2 oz (63.1 kg)  04/30/20 142 lb 3.2 oz (64.5 kg)  03/16/20 140 lb 9.6 oz (63.8 kg)     GEN:  Well nourished, well developed in no acute distress HEENT: Normal NECK: No JVD; No carotid bruits LYMPHATICS: No lymphadenopathy CARDIAC: RRR, no murmurs, rubs, gallops RESPIRATORY:  Clear to auscultation without rales, wheezing or rhonchi  ABDOMEN: Soft, non-tender, non-distended MUSCULOSKELETAL:  No edema; No deformity  SKIN: Warm and dry NEUROLOGIC:  Alert and oriented x 3 PSYCHIATRIC:  Normal affect   ASSESSMENT:    1. PVC's (premature ventricular contractions)    PLAN:    In order of problems listed above:  1. Premature ventricular contractions: She is had palpitations for the past year or so.  These typically occur when she is stressed out.  These palpitations only last for a second or so and are associated with a very brief episode of shortness of breath.  I suspect that these are premature ventricular contractions.  She might have several for a week and then not have any for many months.  We discussed placing a monitor on her but she is not sure that she will have enough of these to register on the monitor.  2. Her last potassium level was normal but her previous potassium level is a little low.  We  discussed having her drink a V8 juice every day.  This should help with her potassium placement and also will help make sure that she is hydrated.  Of advised her to try getting better sleep.  She admits that she does not sleep well at all.  She will call 05/16/20 back if she does not improve with the above-noted  measures and will place a monitor on her.  I will see her on an as-needed basis.   Medication Adjustments/Labs and Tests Ordered: Current medicines are reviewed at length with the patient today.  Concerns regarding medicines are outlined above.  Orders Placed This Encounter  Procedures   EKG 12-Lead   No orders of the defined types were placed in this encounter.   Patient Instructions  Please drink a can of V8 daily. Stay hydrated and try to get better sleep!  Please call us if your symptoms worsen and you want to wear a monitor.   Your provider recommends that you schedule a follow-up appointment AS NEEDED.  Premature Ventricular Contraction  A premature ventricular contraction (PVC) is a common kind of irregular heartbeat (arrhythmia). These contractions are extra heartbeats that start in the ventricles of the heart and occur too early in the normal sequence. During the PVC, the heart's normal electrical pathway is not used, so the beat is shorter and less effective. In most cases, these contractions come and go and do not require treatment. What are the causes? Common causes of the condition include:  Smoking.  Drinking alcohol.  Certain medicines.  Some illegal drugs.  Stress.  Caffeine. Certain medical conditions can also cause PVCs:  Heart failure.  Heart attack, or coronary artery disease.  Heart valve problems.  Changes in minerals in the blood (electrolytes).  Low blood oxygen levels or high carbon dioxide levels. In many cases, the cause of this condition is not known. What are the signs or symptoms? The main symptom of this condition is fast or  skipped heartbeats (palpitations). Other symptoms include:  Chest pain.  Shortness of breath.  Feeling tired.  Dizziness.  Difficulty exercising. In some cases, there are no symptoms. How is this diagnosed? This condition may be diagnosed based on:  Your medical history.  A physical exam. During the exam, the health care provider will check for irregular heartbeats.  Tests, such as: ? An ECG (electrocardiogram) to monitor the electrical activity of your heart. ? An ambulatory cardiac monitor. This device records your heartbeats for 24 hours or more. ? Stress tests to see how exercise affects your heart rhythm and blood supply. ? An echocardiogram. This test uses sound waves (ultrasound) to produce an image of your heart. ? An electrophysiology study (EPS). This test checks for electrical problems in your heart. How is this treated? Treatment for this condition depends on any underlying conditions, the type of PVCs that you are having, and how much the symptoms are interfering with your daily life. Possible treatments include:  Avoiding things that cause premature contractions (triggers). These include caffeine and alcohol.  Taking medicines if symptoms are severe or if the extra heartbeats are frequent.  Getting treatment for underlying conditions that cause PVCs.  Having an implantable cardioverter defibrillator (ICD), if you are at risk for a serious arrhythmia. The ICD is a small device that is inserted into your chest to monitor your heartbeat. When it senses an irregular heartbeat, it sends a shock to bring the heartbeat back to normal.  Having a procedure to destroy the portion of the heart tissue that sends out abnormal signals (catheter ablation). In some cases, no treatment is required. Follow these instructions at home: Lifestyle  Do not use any products that contain nicotine or tobacco, such as cigarettes, e-cigarettes, and chewing tobacco. If you need help  quitting, ask your health care provider.  Do not use illegal drugs.  Exercise regularly.  Ask your health care provider what type of exercise is safe for you.  Try to get at least 7-9 hours of sleep each night, or as much as recommended by your health care provider.  Find healthy ways to manage stress. Avoid stressful situations when possible. Alcohol use  Do not drink alcohol if: ? Your health care provider tells you not to drink. ? You are pregnant, may be pregnant, or are planning to become pregnant. ? Alcohol triggers your episodes.  If you drink alcohol: ? Limit how much you use to:  0-1 drink a day for women.  0-2 drinks a day for men.  Be aware of how much alcohol is in your drink. In the [place] one drink equals one 12 oz bottle of beer (355 mL), one 5 oz glass of wine (148 mL), or one 1 oz glass of hard liquor (44 mL). General instructions  Take over-the-counter and prescription medicines only as told by your health care provider.  If caffeine triggers episodes of PVC, do not eat, drink, or use anything with caffeine in it.  Keep all follow-up visits as told by your health care provider. This is important. Contact a health care provider if you:  Feel palpitations. Get help right away if you:  Have chest pain.  Have shortness of breath.  Have sweating for no reason.  Have nausea and vomiting.  Become light-headed or you faint. Summary  A premature ventricular contraction (PVC) is a common kind of irregular heartbeat (arrhythmia).  In most cases, these contractions come and go and do not require treatment.  You may need to wear an ambulatory cardiac monitor. This records your heartbeats for 24 hours or more.  Treatment depends on any underlying conditions, the type of PVCs that you are having, and how much the symptoms are interfering with your daily life. This information is not intended to replace advice given to you by your health care provider. Make sure  you discuss any questions you have with your health care provider. Document Revised: 07/25/2018 Document Reviewed: 07/25/2018 Elsevier Patient Education  2020 [employer].     Signed, Kristeen Miss, MD  07/16/2020 2:22 PM    [facility]

## 2020-07-16 ENCOUNTER — Ambulatory Visit (INDEPENDENT_AMBULATORY_CARE_PROVIDER_SITE_OTHER): Payer: 59 | Admitting: Cardiovascular Disease

## 2020-07-16 ENCOUNTER — Encounter: Payer: Self-pay | Admitting: Cardiovascular Disease

## 2020-07-16 ENCOUNTER — Other Ambulatory Visit: Payer: Self-pay

## 2020-07-16 VITALS — BP 102/68 | HR 74 | Ht 61.0 in | Wt 139.2 lb

## 2020-07-16 DIAGNOSIS — I493 Ventricular premature depolarization: Secondary | ICD-10-CM

## 2020-07-16 NOTE — Patient Instructions (Signed)
Please drink a can of V8 daily. Stay hydrated and try to get better sleep!  Please call us if your symptoms worsen and you want to wear a monitor.   Your provider recommends that you schedule a follow-up appointment AS NEEDED.  Premature Ventricular Contraction  A premature ventricular contraction (PVC) is a common kind of irregular heartbeat (arrhythmia). These contractions are extra heartbeats that start in the ventricles of the heart and occur too early in the normal sequence. During the PVC, the heart's normal electrical pathway is not used, so the beat is shorter and less effective. In most cases, these contractions come and go and do not require treatment. What are the causes? Common causes of the condition include:  Smoking.  Drinking alcohol.  Certain medicines.  Some illegal drugs.  Stress.  Caffeine. Certain medical conditions can also cause PVCs:  Heart failure.  Heart attack, or coronary artery disease.  Heart valve problems.  Changes in minerals in the blood (electrolytes).  Low blood oxygen levels or high carbon dioxide levels. In many cases, the cause of this condition is not known. What are the signs or symptoms? The main symptom of this condition is fast or skipped heartbeats (palpitations). Other symptoms include:  Chest pain.  Shortness of breath.  Feeling tired.  Dizziness.  Difficulty exercising. In some cases, there are no symptoms. How is this diagnosed? This condition may be diagnosed based on:  Your medical history.  A physical exam. During the exam, the health care provider will check for irregular heartbeats.  Tests, such as: ? An ECG (electrocardiogram) to monitor the electrical activity of your heart. ? An ambulatory cardiac monitor. This device records your heartbeats for 24 hours or more. ? Stress tests to see how exercise affects your heart rhythm and blood supply. ? An echocardiogram. This test uses sound waves (ultrasound)  to produce an image of your heart. ? An electrophysiology study (EPS). This test checks for electrical problems in your heart. How is this treated? Treatment for this condition depends on any underlying conditions, the type of PVCs that you are having, and how much the symptoms are interfering with your daily life. Possible treatments include:  Avoiding things that cause premature contractions (triggers). These include caffeine and alcohol.  Taking medicines if symptoms are severe or if the extra heartbeats are frequent.  Getting treatment for underlying conditions that cause PVCs.  Having an implantable cardioverter defibrillator (ICD), if you are at risk for a serious arrhythmia. The ICD is a small device that is inserted into your chest to monitor your heartbeat. When it senses an irregular heartbeat, it sends a shock to bring the heartbeat back to normal.  Having a procedure to destroy the portion of the heart tissue that sends out abnormal signals (catheter ablation). In some cases, no treatment is required. Follow these instructions at home: Lifestyle  Do not use any products that contain nicotine or tobacco, such as cigarettes, e-cigarettes, and chewing tobacco. If you need help quitting, ask your health care provider.  Do not use illegal drugs.  Exercise regularly. Ask your health care provider what type of exercise is safe for you.  Try to get at least 7-9 hours of sleep each night, or as much as recommended by your health care provider.  Find healthy ways to manage stress. Avoid stressful situations when possible. Alcohol use  Do not drink alcohol if: ? Your health care provider tells you not to drink. ? You are pregnant, may be  pregnant, or are planning to become pregnant. ? Alcohol triggers your episodes.  If you drink alcohol: ? Limit how much you use to:  0-1 drink a day for women.  0-2 drinks a day for men.  Be aware of how much alcohol is in your drink. In  the [place] one drink equals one 12 oz bottle of beer (355 mL), one 5 oz glass of wine (148 mL), or one 1 oz glass of hard liquor (44 mL). General instructions  Take over-the-counter and prescription medicines only as told by your health care provider.  If caffeine triggers episodes of PVC, do not eat, drink, or use anything with caffeine in it.  Keep all follow-up visits as told by your health care provider. This is important. Contact a health care provider if you:  Feel palpitations. Get help right away if you:  Have chest pain.  Have shortness of breath.  Have sweating for no reason.  Have nausea and vomiting.  Become light-headed or you faint. Summary  A premature ventricular contraction (PVC) is a common kind of irregular heartbeat (arrhythmia).  In most cases, these contractions come and go and do not require treatment.  You may need to wear an ambulatory cardiac monitor. This records your heartbeats for 24 hours or more.  Treatment depends on any underlying conditions, the type of PVCs that you are having, and how much the symptoms are interfering with your daily life. This information is not intended to replace advice given to you by your health care provider. Make sure you discuss any questions you have with your health care provider. Document Revised: 07/25/2018 Document Reviewed: 07/25/2018 Elsevier Patient Education  2020 [employer].

## 2020-07-22 ENCOUNTER — Encounter: Payer: 59 | Admitting: Nurse Practitioner

## 2020-08-16 ENCOUNTER — Telehealth: Payer: Self-pay | Admitting: Cardiovascular Disease

## 2020-08-16 ENCOUNTER — Telehealth: Payer: Self-pay | Admitting: Registered Nurse

## 2020-08-16 DIAGNOSIS — I493 Ventricular premature depolarization: Secondary | ICD-10-CM

## 2020-08-16 DIAGNOSIS — R002 Palpitations: Secondary | ICD-10-CM

## 2020-08-16 NOTE — Telephone Encounter (Signed)
Attempted to call the pt but no answer and her voice mail was not set up so I could not leave a message. Will try again in the a.m.

## 2020-08-16 NOTE — Telephone Encounter (Signed)
Error

## 2020-08-16 NOTE — Telephone Encounter (Signed)
Patient would like a script so she can get pulse ox. She was told at her last visit if she wanted one he would write her script for it.

## 2020-08-16 NOTE — Telephone Encounter (Signed)
Dennie Bible is calling regarding [facility] gybecology referral    Still hasnt heard anything yet

## 2020-08-17 ENCOUNTER — Telehealth: Payer: Self-pay | Admitting: Radiology

## 2020-08-17 NOTE — Telephone Encounter (Signed)
I spoke with patient. She reports she was told to call if she would like to wear a monitor.  Patient reports her palpitations have become stronger lately.  Occur 2-3 times per week.  She would like to wear monitor.

## 2020-08-17 NOTE — Telephone Encounter (Signed)
Lets order a 14 day Zio monitor  We never discussed writing a prescription for a pulse oxymeter. They are available at most / all drug stores and online without a prescription.  Thanks

## 2020-08-17 NOTE — Telephone Encounter (Signed)
Enrolled patient for a 14 day Zio XT  monitor to be mailed to patients home  °

## 2020-08-17 NOTE — Telephone Encounter (Signed)
Patient notified and would like to proceed with monitor.  Address and insurance information are correct in chart.

## 2020-09-17 ENCOUNTER — Ambulatory Visit (INDEPENDENT_AMBULATORY_CARE_PROVIDER_SITE_OTHER): Payer: 59 | Admitting: Registered Nurse

## 2020-09-17 ENCOUNTER — Encounter: Payer: Self-pay | Admitting: Registered Nurse

## 2020-09-17 ENCOUNTER — Other Ambulatory Visit: Payer: Self-pay

## 2020-09-17 VITALS — BP 105/67 | HR 73 | Temp 98.0°F | Resp 18 | Ht 61.0 in | Wt 140.8 lb

## 2020-09-17 DIAGNOSIS — R519 Headache, unspecified: Secondary | ICD-10-CM | POA: Diagnosis not present

## 2020-09-17 NOTE — Patient Instructions (Signed)
° ° ° °  If you have lab work done today you will be contacted with your lab results within the next 2 weeks.  If you have not heard from us then please contact us. The fastest way to get your results is to register for My Chart. ° ° °IF you received an x-ray today, you will receive an invoice from [facility]. Please contact [facility] at [phone] with questions or concerns regarding your invoice.  ° °IF you received labwork today, you will receive an invoice from [facility]. Please contact [facility] at [phone] with questions or concerns regarding your invoice.  ° °Our billing staff will not be able to assist you with questions regarding bills from these companies. ° °You will be contacted with the lab results as soon as they are available. The fastest way to get your results is to activate your My Chart account. Instructions are located on the last page of this paperwork. If you have not heard from us regarding the results in 2 weeks, please contact this office. °  ° ° ° °

## 2020-09-18 ENCOUNTER — Encounter: Payer: Self-pay | Admitting: Registered Nurse

## 2020-09-18 LAB — CBC
Hematocrit: 43.8 % (ref 34.0–46.6)
Hemoglobin: 14.2 g/dL (ref 11.1–15.9)
MCH: 29.3 pg (ref 26.6–33.0)
MCHC: 32.4 g/dL (ref 31.5–35.7)
MCV: 91 fL (ref 79–97)
Platelets: 139 10*3/uL — ABNORMAL LOW (ref 150–450)
RBC: 4.84 x10E6/uL (ref 3.77–5.28)
RDW: 12.6 % (ref 11.7–15.4)
WBC: 7.7 10*3/uL (ref 3.4–10.8)

## 2020-09-18 LAB — COMPREHENSIVE METABOLIC PANEL
ALT: 34 IU/L — ABNORMAL HIGH (ref 0–32)
AST: 20 IU/L (ref 0–40)
Albumin/Globulin Ratio: 1.6 (ref 1.2–2.2)
Albumin: 4.9 g/dL — ABNORMAL HIGH (ref 3.8–4.8)
Alkaline Phosphatase: 66 IU/L (ref 44–121)
BUN/Creatinine Ratio: 19 (ref 9–23)
BUN: 12 mg/dL (ref 6–20)
Bilirubin Total: 0.4 mg/dL (ref 0.0–1.2)
CO2: 21 mmol/L (ref 20–29)
Calcium: 9.7 mg/dL (ref 8.7–10.2)
Chloride: 100 mmol/L (ref 96–106)
Creatinine, Ser: 0.64 mg/dL (ref 0.57–1.00)
GFR calc Af Amer: 130 mL/min/{1.73_m2} (ref >59)
GFR calc non Af Amer: 113 mL/min/{1.73_m2} (ref >59)
Globulin, Total: 3.1 g/dL (ref 1.5–4.5)
Glucose: 78 mg/dL (ref 65–99)
Potassium: 4.8 mmol/L (ref 3.5–5.2)
Sodium: 136 mmol/L (ref 134–144)
Total Protein: 8 g/dL (ref 6.0–8.5)

## 2020-09-18 LAB — VITAMIN B12: Vitamin B-12: 603 pg/mL (ref 232–1245)

## 2020-09-18 LAB — THYROID PANEL WITH TSH
Free Thyroxine Index: 1.9 (ref 1.2–4.9)
T3 Uptake Ratio: 25 % (ref 24–39)
T4, Total: 7.7 ug/dL (ref 4.5–12.0)
TSH: 1.32 u[IU]/mL (ref 0.450–4.500)

## 2020-09-18 LAB — IRON,TIBC AND FERRITIN PANEL
Ferritin: 65 ng/mL (ref 15–150)
Iron Saturation: 42 % (ref 15–55)
Iron: 138 ug/dL (ref 27–159)
Total Iron Binding Capacity: 325 ug/dL (ref 250–450)
UIBC: 187 ug/dL (ref 131–425)

## 2020-09-18 LAB — VITAMIN D 25 HYDROXY (VIT D DEFICIENCY, FRACTURES): Vit D, 25-Hydroxy: 27.3 ng/mL — ABNORMAL LOW (ref 30.0–100.0)

## 2020-09-18 LAB — ANA W/REFLEX: Anti Nuclear Antibody (ANA): NEGATIVE

## 2020-09-18 NOTE — Progress Notes (Signed)
Acute Office Visit  Subjective:    Patient ID: Belinda Mcintosh, female    DOB: [birth year], 39 y.o.   MRN: [id number]  Chief Complaint  Patient presents with  . Headache    Patient states starting last week she started to feel some burning on the right side of her face mostly under and behind her eye. Patient states she has also started to get headaches as well due to this. Per patient she has been taking ibuprofen but she feels like its more of an nerve damaged    HPI Patient is in today for burning sensation in her face This has led to headaches that feel like they mostly sit in occiput.  Sometimes pain behind eye Does not describe pain as severe Does not change with exposure to hot or cold air No visual changes, no loc, no chest pain Mostly R sided but L side not spared No rash Has had headaches in the past but they are usually more easily relived by ibuprofen.   Past Medical History:  Diagnosis Date  . Abdominal pain   . Flank pain   . History of kidney stones   . Low back pain   . Palpitations   . Umbilical hernia 2018    Past Surgical History:  Procedure Laterality Date  . COSMETIC SURGERY    . INDUCED ABORTION    . LIPOSUCTION    . NOSE SURGERY      Family History  Problem Relation Age of Onset  . Hypertension Mother   . Diabetes Mother   . Breast cancer Mother        Breast Cancer  . Cancer Father   . Hypertension Father   . Diabetes Father   . Cancer Sister   . Fibromyalgia Sister   . Other Sister        spinal stenosis  . Alcohol abuse Neg Hx   . Arthritis Neg Hx   . Asthma Neg Hx   . COPD Neg Hx   . Depression Neg Hx   . Drug abuse Neg Hx   . Early death Neg Hx   . Heart disease Neg Hx   . Hearing loss Neg Hx   . Hyperlipidemia Neg Hx   . Kidney disease Neg Hx   . Learning disabilities Neg Hx   . Mental illness Neg Hx   . Mental retardation Neg Hx   . Miscarriages / Stillbirths Neg Hx   . Stroke Neg Hx   . Vision loss Neg Hx   .  Varicose Veins Neg Hx     Social History   Socioeconomic History  . Marital status: Married    Spouse name: Not on file  . Number of children: 1  . Years of education: Not on file  . Highest education level: Not on file  Occupational History  . Not on file  Tobacco Use  . Smoking status: Former Smoker    Packs/day: 1.00    Types: Cigarettes    Quit date: 09/21/2016    Years since quitting: 3.9  . Smokeless tobacco: Never Used  Vaping Use  . Vaping Use: Never used  Substance and Sexual Activity  . Alcohol use: No  . Drug use: No  . Sexual activity: Yes  Other Topics Concern  . Not on file  Social History Narrative   ** Merged History Encounter **       Social Determinants of Health   Financial Resource Strain:   .  Difficulty of Paying Living Expenses: Not on file  Food Insecurity:   . Worried About Programme researcher, broadcasting/film/video in the Last Year: Not on file  . Ran Out of Food in the Last Year: Not on file  Transportation Needs:   . Lack of Transportation (Medical): Not on file  . Lack of Transportation (Non-Medical): Not on file  Physical Activity:   . Days of Exercise per Week: Not on file  . Minutes of Exercise per Session: Not on file  Stress:   . Feeling of Stress : Not on file  Social Connections:   . Frequency of Communication with Friends and Family: Not on file  . Frequency of Social Gatherings with Friends and Family: Not on file  . Attends Religious Services: Not on file  . Active Member of Clubs or Organizations: Not on file  . Attends Banker Meetings: Not on file  . Marital Status: Not on file  Intimate Partner Violence:   . Fear of Current or Ex-Partner: Not on file  . Emotionally Abused: Not on file  . Physically Abused: Not on file  . Sexually Abused: Not on file    Outpatient Medications Prior to Visit  Medication Sig Dispense Refill  . IBUPROFEN & ACETAMINOPHEN PO Take 1 tablet by mouth as needed.    . Multiple Vitamin (MULTIVITAMIN)  tablet Take 1 tablet by mouth daily.     No facility-administered medications prior to visit.    Allergies  Allergen Reactions  . Shrimp [Shellfish Allergy] Itching    Review of Systems  Constitutional: Negative.   HENT: Negative.   Eyes: Negative.   Respiratory: Negative.   Cardiovascular: Negative.   Gastrointestinal: Negative.   Genitourinary: Negative.   Musculoskeletal: Negative.   Skin: Negative.   Neurological: Negative.   Psychiatric/Behavioral: Negative.        Objective:    Physical Exam Constitutional:      General: She is not in acute distress.    Appearance: She is well-developed and normal weight. She is not ill-appearing, toxic-appearing or diaphoretic.  Eyes:     General: No visual field deficit. Neurological:     Mental Status: She is alert and oriented to person, place, and time. Mental status is at baseline.     GCS: GCS eye subscore is 4. GCS verbal subscore is 5. GCS motor subscore is 6.     Cranial Nerves: No cranial nerve deficit, dysarthria or facial asymmetry.     Sensory: No sensory deficit.     Motor: No weakness.     Coordination: Romberg sign negative. Coordination normal.     Gait: Gait normal.  Psychiatric:        Mood and Affect: Mood normal. Mood is not anxious or depressed.        Speech: Speech normal.        Behavior: Behavior normal. Behavior is not agitated.        Cognition and Memory: Cognition is not impaired. Memory is not impaired.     BP 105/67   Pulse 73   Temp 98 F (36.7 C) (Temporal)   Resp 18   Ht 5\' 1"  (1.549 m)   Wt 140 lb 12.8 oz (63.9 kg)   SpO2 98%   BMI 26.60 kg/m  Wt Readings from Last 3 Encounters:  09/17/20 140 lb 12.8 oz (63.9 kg)  07/16/20 139 lb 3.2 oz (63.1 kg)  04/30/20 142 lb 3.2 oz (64.5 kg)    Health Maintenance Due  Topic Date Due  . PAP SMEAR-Modifier  05/09/2020    There are no preventive care reminders to display for this patient.   Lab Results  Component Value Date   TSH  1.320 09/17/2020   Lab Results  Component Value Date   WBC 7.7 09/17/2020   HGB 14.2 09/17/2020   HCT 43.8 09/17/2020   MCV 91 09/17/2020   PLT 139 (L) 09/17/2020   Lab Results  Component Value Date   NA 136 09/17/2020   K 4.8 09/17/2020   CO2 21 09/17/2020   GLUCOSE 78 09/17/2020   BUN 12 09/17/2020   CREATININE 0.64 09/17/2020   BILITOT 0.4 09/17/2020   ALKPHOS 66 09/17/2020   AST 20 09/17/2020   ALT 34 (H) 09/17/2020   PROT 8.0 09/17/2020   ALBUMIN 4.9 (H) 09/17/2020   CALCIUM 9.7 09/17/2020   ANIONGAP 9 05/31/2017   No results found for: CHOL No results found for: HDL No results found for: LDLCALC No results found for: TRIG No results found for: CHOLHDL No results found for: JGOT1X     Assessment & Plan:   Problem List Items Addressed This Visit    None    Visit Diagnoses    New onset of headaches    -  Primary   Relevant Orders   Vitamin B12 (Completed)   ANA w/Reflex (Completed)   Vitamin D, 25-hydroxy (Completed)   CBC (Completed)   Iron, TIBC and Ferritin Panel (Completed)   Comprehensive metabolic panel (Completed)   Thyroid Panel With TSH (Completed)       No orders of the defined types were placed in this encounter.  PLAN  Migraine type headache vs. Optic neuralgia, perhaps some consideration for other process? Does not seem to be acutely worsening, no red flags.   Pt would prefer referral to neuro rather than trying a course of steroids or a triptan. Discussed ER precautions, pt voiced understanding  Patient encouraged to call clinic with any questions, comments, or concerns.   [name], NP

## 2020-09-28 ENCOUNTER — Other Ambulatory Visit: Payer: Self-pay | Admitting: Registered Nurse

## 2020-09-28 ENCOUNTER — Telehealth: Payer: Self-pay | Admitting: Registered Nurse

## 2020-09-28 DIAGNOSIS — F4323 Adjustment disorder with mixed anxiety and depressed mood: Secondary | ICD-10-CM

## 2020-09-28 NOTE — Telephone Encounter (Signed)
Pt would like Rx for family stress and her lab results from visit 09/17/2020

## 2020-09-28 NOTE — Telephone Encounter (Signed)
09/28/2020 - PATIENT IS CALLING TO SAY SHE IS STILL WAITING FOR RICH TO CALL HER REGARDING HER LAB RESULTS. SHE SAW RICH ON 09/17/2020. THE NEUROLOGIST CAN NOT SEE HER UNTIL January 2022. SHE REALLY NEEDS SOMETHING CALLED INTO HER PHARMACY FOR FAMILY STRESS. BEST PHONE [phone] (CELL) MBC

## 2020-09-28 NOTE — Telephone Encounter (Signed)
Referrals placed Lab note sent to PCP Lab pool - no concerns, but no cause for her symptoms noted. I think stress is playing a big role.  Thank you  Jari Sportsman, NP

## 2020-09-28 NOTE — Progress Notes (Signed)
Please call patient -  Labs do not reveal any acute abnormalities. I think stress may be playing a big role in her symptoms. Will refer to counseling and psychiatry to see what progress can be made there.   Thank you  Jari Sportsman, NP

## 2020-09-29 ENCOUNTER — Telehealth: Payer: Self-pay

## 2020-11-09 ENCOUNTER — Telehealth: Payer: Self-pay

## 2020-11-09 ENCOUNTER — Telehealth (INDEPENDENT_AMBULATORY_CARE_PROVIDER_SITE_OTHER): Payer: 59 | Admitting: Registered Nurse

## 2020-11-09 DIAGNOSIS — R059 Cough, unspecified: Secondary | ICD-10-CM

## 2020-11-09 MED ORDER — BENZONATATE 200 MG PO CAPS: 200.0000 mg | ORAL_CAPSULE | Freq: Two times a day (BID) | ORAL | 0 refills | Status: DC | PRN

## 2020-11-09 NOTE — Telephone Encounter (Signed)
na

## 2020-11-09 NOTE — Telephone Encounter (Signed)
No additional notes required  

## 2020-11-09 NOTE — Patient Instructions (Signed)
° ° ° °  If you have lab work done today you will be contacted with your lab results within the next 2 weeks.  If you have not heard from us then please contact us. The fastest way to get your results is to register for My Chart. ° ° °IF you received an x-ray today, you will receive an invoice from [facility]. Please contact  Radiology at [phone] with questions or concerns regarding your invoice.  ° °IF you received labwork today, you will receive an invoice from [facility]. Please contact [facility] at [phone] with questions or concerns regarding your invoice.  ° °Our billing staff will not be able to assist you with questions regarding bills from these companies. ° °You will be contacted with the lab results as soon as they are available. The fastest way to get your results is to activate your My Chart account. Instructions are located on the last page of this paperwork. If you have not heard from us regarding the results in 2 weeks, please contact this office. °  ° ° ° °

## 2020-11-10 ENCOUNTER — Telehealth: Payer: Self-pay | Admitting: Registered Nurse

## 2020-11-10 NOTE — Progress Notes (Signed)
Telemedicine Encounter- SOAP NOTE Established Patient  This telephone encounter was conducted with the patient's (or proxy's) verbal consent via audio telecommunications: yes  Patient was instructed to have this encounter in a suitably private space; and to only have persons present to whom they give permission to participate. In addition, patient identity was confirmed by use of name plus two identifiers (DOB and address).  I discussed the limitations, risks, security and privacy concerns of performing an evaluation and management service by telephone and the availability of in person appointments. I also discussed with the patient that there may be a patient responsible charge related to this service. The patient expressed understanding and agreed to proceed.  I spent a total of 15 minutes talking with the patient or their proxy.  Patient at home Provider in office  Chief Complaint  Patient presents with   Cough    Pt has had cough for 3 day daughter x 1 week cough only. Pt needs COVID test prior to Thursday due to house closing that day she needs to be present or move this depending on results     Subjective   Belinda Mcintosh is a 39 y.o. established patient. Telephone visit today for cough  HPI Mild, onset a few days ago, no sick contacts, already resolving However, she is closing on a house on Thursday - agent requested negative test Does not want to delay closing Requesting test as she cannot find any pharmacy or urgent care that has any testing.  No other complaints or symptoms at this time.  Has rec'd both doses of covid vaccine, no booster yet.  Patient Active Problem List   Diagnosis Date Noted   PVC (premature ventricular contraction) 07/16/2020   Genital warts complicating pregnancy, third trimester 04/30/2017   Umbilical hernia 02/07/2017    Past Medical History:  Diagnosis Date   Abdominal pain    Flank pain    History of kidney stones    Low back  pain    Palpitations    Umbilical hernia 2018    Current Outpatient Medications  Medication Sig Dispense Refill   benzonatate (TESSALON) 200 MG capsule Take 1 capsule (200 mg total) by mouth 2 (two) times daily as needed for cough. 20 capsule 0   IBUPROFEN & ACETAMINOPHEN PO Take 1 tablet by mouth as needed.     Multiple Vitamin (MULTIVITAMIN) tablet Take 1 tablet by mouth daily.     No current facility-administered medications for this visit.    Allergies  Allergen Reactions   Shrimp [Shellfish Allergy] Itching    Social History   Socioeconomic History   Marital status: Married    Spouse name: Not on file   Number of children: 1   Years of education: Not on file   Highest education level: Not on file  Occupational History   Not on file  Tobacco Use   Smoking status: Former Smoker    Packs/day: 1.00    Types: Cigarettes    Quit date: 09/21/2016    Years since quitting: 4.1   Smokeless tobacco: Never Used  Vaping Use   Vaping Use: Never used  Substance and Sexual Activity   Alcohol use: No   Drug use: No   Sexual activity: Yes  Other Topics Concern   Not on file  Social History Narrative   ** Merged History Encounter **       Social Determinants of Health   Financial Resource Strain: Not on file  Food  Insecurity: Not on file  Transportation Needs: Not on file  Physical Activity: Not on file  Stress: Not on file  Social Connections: Not on file  Intimate Partner Violence: Not on file    ROS Per hpi  Objective   Vitals as reported by the patient: There were no vitals filed for this visit.  Belinda Mcintosh was seen today for cough.  Diagnoses and all orders for this visit:  Cough -     COVID-19, Flu A+B and RSV -     benzonatate (TESSALON) 200 MG capsule; Take 1 capsule (200 mg total) by mouth 2 (two) times daily as needed for cough.   PLAN  Drive up testing, follow up as warranted  Tessalon for cough  Hydration and rest  Isolate  until results back  Patient encouraged to call clinic with any questions, comments, or concerns.   I discussed the assessment and treatment plan with the patient. The patient was provided an opportunity to ask questions and all were answered. The patient agreed with the plan and demonstrated an understanding of the instructions.   The patient was advised to call back or seek an in-person evaluation if the symptoms worsen or if the condition fails to improve as anticipated.  I provided 15 minutes of non-face-to-face time during this encounter.  [name], NP  [facility]

## 2020-11-10 NOTE — Telephone Encounter (Signed)
Patient is requesting a call with recent covid results .  Needs results for work and is in a hurry for them

## 2020-11-10 NOTE — Telephone Encounter (Signed)
Patient is calling again . Wants to make sure we call her as soon as we have results back

## 2020-11-11 LAB — COVID-19, FLU A+B AND RSV
Influenza A, NAA: NOT DETECTED
Influenza B, NAA: NOT DETECTED
RSV, NAA: NOT DETECTED
SARS-CoV-2, NAA: NOT DETECTED

## 2020-11-13 NOTE — L&D Delivery Note (Addendum)
OB/GYN Faculty Practice Delivery Note  Belinda Mcintosh is a 40 y.o. [phone] s/p SVD at [redacted]w[redacted]d. She was admitted for SOL.   ROM: 14h 32m with clear fluid GBS Status: Negative/-- (09/06 1102) Maximum Maternal Temperature: 99.3  Labor Progress: Initial SVE: 6/80/-2. Low dose pitocin initiated. She then progressed to complete.   Delivery Date/Time: 08/21/2021 @1521  Delivery: Called to room and patient was complete and pushing. Head delivered ROA. No nuchal cord present. Shoulder and body delivered in usual fashion. Infant with spontaneous cry, placed on mother's abdomen, dried and stimulated. Cord clamped x 2 after 1-minute delay, and cut by father of baby. Cord blood drawn. Placenta delivered spontaneously with gentle cord traction. Fundus firm with massage and Pitocin. Labia, perineum, vagina, and cervix inspected and found to have a 2nd degree perineal laceration.  Baby Weight: pending  Placenta: 3 vessel, intact. Sent to L&D Complications: None Lacerations: 2nd deg perineal lac, repaired with 3.0 vicryl  EBL: 10 mL Analgesia: Epidural   Infant:  APGAR (1 MIN): 8   APGAR (5 MINS): 9    Simone Autry-Lott, DO 08/21/2021, 4:44 PM PGY-3, [facility]     I have seen and examined this patient and agree with above documentation in the resident's note. I was gloved and present at the time of delivery and available for questions/assistance.   10/21/2021, CNM 08/21/2021 4:56 PM

## 2020-11-16 ENCOUNTER — Telehealth: Payer: Self-pay | Admitting: Registered Nurse

## 2020-11-16 DIAGNOSIS — R109 Unspecified abdominal pain: Secondary | ICD-10-CM

## 2020-11-16 NOTE — Telephone Encounter (Signed)
Pt came in and is wanting her referral sent in to another office. She would like it sent to [facility]. Please advise.

## 2020-11-16 NOTE — Telephone Encounter (Signed)
Pt got confused and Pt is coming in for this appt on friday and this is what she is wanting it sent to the Urologist. PT doesn't want to be seen for the headaches yet. Please advise.

## 2020-11-17 ENCOUNTER — Telehealth: Payer: Self-pay | Admitting: Registered Nurse

## 2020-11-17 ENCOUNTER — Other Ambulatory Visit: Payer: Self-pay | Admitting: Registered Nurse

## 2020-11-17 DIAGNOSIS — R109 Unspecified abdominal pain: Secondary | ICD-10-CM

## 2020-11-17 DIAGNOSIS — R10A0 Flank pain, unspecified side: Secondary | ICD-10-CM

## 2020-11-17 DIAGNOSIS — N390 Urinary tract infection, site not specified: Secondary | ICD-10-CM

## 2020-11-17 NOTE — Telephone Encounter (Signed)
Pt has an appointment on Friday and she would like to have a referral to urology preferably to [facility] because of her back/ kidney pain and frequent urination.     I have pended the order to the provider.

## 2020-11-17 NOTE — Telephone Encounter (Signed)
Pt called and is wanting a Different office for Neology Referral she is wanting [facility] [phone]  [address], [place], [place] [address]  Please advise.

## 2020-11-18 ENCOUNTER — Other Ambulatory Visit: Payer: Self-pay

## 2020-11-18 ENCOUNTER — Encounter: Payer: Self-pay | Admitting: Family Medicine

## 2020-11-18 ENCOUNTER — Ambulatory Visit (INDEPENDENT_AMBULATORY_CARE_PROVIDER_SITE_OTHER): Payer: 59 | Admitting: Family Medicine

## 2020-11-18 DIAGNOSIS — R109 Unspecified abdominal pain: Secondary | ICD-10-CM | POA: Diagnosis not present

## 2020-11-18 LAB — POCT URINALYSIS DIP (MANUAL ENTRY)
Bilirubin, UA: NEGATIVE
Glucose, UA: NEGATIVE mg/dL
Ketones, POC UA: NEGATIVE mg/dL
Nitrite, UA: NEGATIVE
Protein Ur, POC: NEGATIVE mg/dL
Spec Grav, UA: >=1.03 — AB (ref 1.010–1.025)
Urobilinogen, UA: 0.2 E.U./dL
pH, UA: 5 (ref 5.0–8.0)

## 2020-11-18 LAB — POC MICROSCOPIC URINALYSIS (UMFC): Mucus: ABSENT

## 2020-11-18 NOTE — Progress Notes (Signed)
Patient ID: Belinda Mcintosh, female    DOB: Jan 04, 1981  Age: 40 y.o. MRN: [id number]  Chief Complaint  Patient presents with  . Urinary Frequency    Pt reports over past few months has been dealing with Lt side low back pain which hurts when she urinates but not pain in bladder or urethra has to go frequently as well pt would like to see a specialist to see what is going on     Subjective:   Patient is here complaining of flank pain.  She would like to see a urologist sometime.  She has problems going way back.  She had an CAT scan done of her abdomen 9 years ago for similar issues.  She has been evaluated a couple of times with CAT scans for possible kidney stones and nothing has been found.  She complains of bilateral flank pain.  No back injuries.  No real dysuria.  She has not been passing any blood in her urine but she is finishing up her menstrual cycle now.  She was very anxious.  Current allergies, medications, problem list, past/family and social histories reviewed.  Objective:  There were no vitals taken for this visit. Pleasant young lady, very talkative.  Chest clear.  Heart regular.  Abdomen soft without mass or tenderness.  Has some tenderness bilateral low back toward the sides of her back CVA area.  Spine has good range of motion.  Straight leg raising test negative.  Gait normal.  Assessment & Plan:   Assessment: 1. Bilateral flank pain       Plan: I think a lot of her problems are muscular for active going on this long.  I will wait and see the results of her studies before deciding on muscle relaxants or other items.  Orders Placed This Encounter  Procedures  . CBC  . Comprehensive metabolic panel  . Sedimentation rate  . POCT Microscopic Urinalysis ([facility])  . POCT urinalysis dipstick    No orders of the defined types were placed in this encounter.        Patient Instructions     I will try to call you tomorrow when I see the results of your  laboratory tests.  If you have lab work done today you will be contacted with your lab results within the next 2 weeks.  If you have not heard from [place] then please contact us. The fastest way to get your results is to register for My Chart.   IF you received an x-ray today, you will receive an invoice from [facility]. Please contact [facility] at [phone] with questions or concerns regarding your invoice.   IF you received labwork today, you will receive an invoice from [facility]. Please contact [facility] at [phone] with questions or concerns regarding your invoice.   Our billing staff will not be able to assist you with questions regarding bills from these companies.  You will be contacted with the lab results as soon as they are available. The fastest way to get your results is to activate your My Chart account. Instructions are located on the last page of this paperwork. If you have not heard from [place] regarding the results in 2 weeks, please contact this office.        Return If further problems arise this recheck with [name], NP.   Janace Hoard, MD 11/18/2020

## 2020-11-18 NOTE — Patient Instructions (Addendum)
   I will try to call you tomorrow when I see the results of your laboratory tests.  If you have lab work done today you will be contacted with your lab results within the next 2 weeks.  If you have not heard from [place] then please contact us. The fastest way to get your results is to register for My Chart.   IF you received an x-ray today, you will receive an invoice from [facility]. Please contact [facility] at [phone] with questions or concerns regarding your invoice.   IF you received labwork today, you will receive an invoice from [facility]. Please contact [facility] at [phone] with questions or concerns regarding your invoice.   Our billing staff will not be able to assist you with questions regarding bills from these companies.  You will be contacted with the lab results as soon as they are available. The fastest way to get your results is to activate your My Chart account. Instructions are located on the last page of this paperwork. If you have not heard from [place] regarding the results in 2 weeks, please contact this office.

## 2020-11-19 ENCOUNTER — Ambulatory Visit: Payer: 59 | Admitting: Registered Nurse

## 2020-11-19 LAB — COMPREHENSIVE METABOLIC PANEL
ALT: 53 IU/L — ABNORMAL HIGH (ref 0–32)
AST: 28 IU/L (ref 0–40)
Albumin/Globulin Ratio: 1.4 (ref 1.2–2.2)
Albumin: 4.6 g/dL (ref 3.8–4.8)
Alkaline Phosphatase: 81 IU/L (ref 44–121)
BUN/Creatinine Ratio: 18 (ref 9–23)
BUN: 13 mg/dL (ref 6–20)
Bilirubin Total: <0.2 mg/dL (ref 0.0–1.2)
CO2: 22 mmol/L (ref 20–29)
Calcium: 9.4 mg/dL (ref 8.7–10.2)
Chloride: 102 mmol/L (ref 96–106)
Creatinine, Ser: 0.72 mg/dL (ref 0.57–1.00)
GFR calc Af Amer: 122 mL/min/{1.73_m2} (ref >59)
GFR calc non Af Amer: 106 mL/min/{1.73_m2} (ref >59)
Globulin, Total: 3.2 g/dL (ref 1.5–4.5)
Glucose: 87 mg/dL (ref 65–99)
Potassium: 4.4 mmol/L (ref 3.5–5.2)
Sodium: 137 mmol/L (ref 134–144)
Total Protein: 7.8 g/dL (ref 6.0–8.5)

## 2020-11-19 LAB — CBC
Hematocrit: 41.4 % (ref 34.0–46.6)
Hemoglobin: 14.3 g/dL (ref 11.1–15.9)
MCH: 31.4 pg (ref 26.6–33.0)
MCHC: 34.5 g/dL (ref 31.5–35.7)
MCV: 91 fL (ref 79–97)
Platelets: 136 10*3/uL — ABNORMAL LOW (ref 150–450)
RBC: 4.56 x10E6/uL (ref 3.77–5.28)
RDW: 12.7 % (ref 11.7–15.4)
WBC: 5.4 10*3/uL (ref 3.4–10.8)

## 2020-11-19 LAB — SEDIMENTATION RATE: Sed Rate: 30 mm/hr (ref 0–32)

## 2020-11-24 NOTE — Progress Notes (Signed)
Attempted to call pt but voicemail box has not been set up yet.  Letter mailed to pt home.

## 2020-12-24 ENCOUNTER — Ambulatory Visit: Payer: 59 | Admitting: Internal Medicine

## 2020-12-28 ENCOUNTER — Ambulatory Visit: Payer: 59 | Admitting: Internal Medicine

## 2020-12-29 ENCOUNTER — Emergency Department (HOSPITAL_COMMUNITY)
Admission: EM | Admit: 2020-12-29 | Discharge: 2020-12-29 | Disposition: A | Discharge status: Home / Self Care | Payer: 59 | Attending: Emergency Medicine | Admitting: Emergency Medicine

## 2020-12-29 ENCOUNTER — Telehealth: Payer: Self-pay | Admitting: Registered Nurse

## 2020-12-29 ENCOUNTER — Telehealth: Payer: 59 | Admitting: Registered Nurse

## 2020-12-29 ENCOUNTER — Encounter (HOSPITAL_COMMUNITY): Payer: Self-pay

## 2020-12-29 ENCOUNTER — Other Ambulatory Visit: Payer: Self-pay

## 2020-12-29 DIAGNOSIS — Z3A01 Less than 8 weeks gestation of pregnancy: Secondary | ICD-10-CM | POA: Diagnosis not present

## 2020-12-29 DIAGNOSIS — Z87891 Personal history of nicotine dependence: Secondary | ICD-10-CM | POA: Insufficient documentation

## 2020-12-29 DIAGNOSIS — O98511 Other viral diseases complicating pregnancy, first trimester: Secondary | ICD-10-CM | POA: Diagnosis not present

## 2020-12-29 DIAGNOSIS — U071 COVID-19: Secondary | ICD-10-CM

## 2020-12-29 DIAGNOSIS — Z3201 Encounter for pregnancy test, result positive: Secondary | ICD-10-CM

## 2020-12-29 DIAGNOSIS — O99891 Other specified diseases and conditions complicating pregnancy: Secondary | ICD-10-CM | POA: Diagnosis present

## 2020-12-29 LAB — URINALYSIS, ROUTINE W REFLEX MICROSCOPIC
Bilirubin Urine: NEGATIVE
Glucose, UA: NEGATIVE mg/dL
Hgb urine dipstick: NEGATIVE
Ketones, ur: NEGATIVE mg/dL
Leukocytes,Ua: NEGATIVE
Nitrite: NEGATIVE
Protein, ur: NEGATIVE mg/dL
Specific Gravity, Urine: 1.004 — ABNORMAL LOW (ref 1.005–1.030)
pH: 6 (ref 5.0–8.0)

## 2020-12-29 LAB — POC SARS CORONAVIRUS 2 AG -  ED: SARS Coronavirus 2 Ag: POSITIVE — AB

## 2020-12-29 LAB — PREGNANCY, URINE: Preg Test, Ur: POSITIVE — AB

## 2020-12-29 MED ORDER — ALUM & MAG HYDROXIDE-SIMETH 200-200-20 MG/5ML PO SUSP
30.0000 mL | Freq: Once | ORAL | Status: AC
  Administered 2020-12-29: 30 mL via ORAL
  Filled 2020-12-29: qty 30

## 2020-12-29 MED ORDER — LIDOCAINE VISCOUS HCL 2 % MT SOLN
15.0000 mL | Freq: Once | OROMUCOSAL | Status: AC
  Administered 2020-12-29: 15 mL via ORAL
  Filled 2020-12-29: qty 15

## 2020-12-29 NOTE — ED Triage Notes (Signed)
Pt presents with c/o fever, chills, and the belief that she might be pregnant. Pt reports LMP 1/6. Pt reports her symptoms started yesterday, someone in her child's class recently tested positive for Covid.

## 2020-12-29 NOTE — Telephone Encounter (Signed)
Error

## 2020-12-29 NOTE — Discharge Instructions (Signed)
You have tested POSITIVE for COVID 19 today. It is recommended that you quarantine for 5 days starting today. If your symptoms are resolving on Day 6 (02/22) and you are no longer have fevers for > 24 hours without fever reducing medications then you can resume normal daily activity with an additional 5 days of mask wearing whenever around other individuals. Please see updated CDC guidelines on their website for further clarifications.   Your pregnancy test has also returned positive today. In this setting please only take Tylenol for symptoms including fevers, headache, body aches. Avoid using Ibuprofen, Advil, Aleve, Aspirin, Naproxen or any other NSAID as this is unsafe in pregnancy.   Follow up with your PCP regarding your ED visit today  Return to the ED for any worsening symptoms

## 2020-12-29 NOTE — ED Provider Notes (Signed)
[facility]-EMERGENCY DEPT Provider Note   CSN: [id number] Arrival date & time: 12/29/20  1644     History Chief Complaint  Patient presents with  . Fever  . Chills    Belinda Mcintosh is a 40 y.o. female who presents to the ED today with complaints of fevers, chills, HA, and fatigue that began yesterday. Pt is vaccinated x 2; no booster. She reports that her daughter who is 3 just tested positive for COVID 19. Pt's husband is also experiencing fevers. Pt states her temperature today was 100.8 and she took a pill that contained both Ibuprofen and Tylenol in it. She states that about 15 minutes later she began having epigastric/substernal chest pain prompting her to come to the ED for further evaluation. Pt also believes she may be pregnant - LNMP 1/06. She has been nauseated for the past few weeks which she typically experiences when she is pregnant. She has not been trying to get pregnant but is sexually active with her husband. Pt denies vision changes, neck stiffness, rash, shortness of breath, vomiting, diarrhea, constipation, urinary symptoms, or any other associated symptoms.   The history is provided by the patient and medical records.       Past Medical History:  Diagnosis Date  . Abdominal pain   . Flank pain   . History of kidney stones   . Low back pain   . Palpitations   . Umbilical hernia 2018    Patient Active Problem List   Diagnosis Date Noted  . PVC (premature ventricular contraction) 07/16/2020  . Genital warts complicating pregnancy, third trimester 04/30/2017  . Umbilical hernia 02/07/2017    Past Surgical History:  Procedure Laterality Date  . COSMETIC SURGERY    . INDUCED ABORTION    . LIPOSUCTION    . NOSE SURGERY       OB History    Gravida  2   Para  1   Term  1   Preterm  0   AB  1   Living  1     SAB  0   IAB  1   Ectopic  0   Multiple  0   Live Births  1           Family History  Problem  Relation Age of Onset  . Hypertension Mother   . Diabetes Mother   . Breast cancer Mother        Breast Cancer  . Cancer Father   . Hypertension Father   . Diabetes Father   . Cancer Sister   . Fibromyalgia Sister   . Other Sister        spinal stenosis  . Alcohol abuse Neg Hx   . Arthritis Neg Hx   . Asthma Neg Hx   . COPD Neg Hx   . Depression Neg Hx   . Drug abuse Neg Hx   . Early death Neg Hx   . Heart disease Neg Hx   . Hearing loss Neg Hx   . Hyperlipidemia Neg Hx   . Kidney disease Neg Hx   . Learning disabilities Neg Hx   . Mental illness Neg Hx   . Mental retardation Neg Hx   . Miscarriages / Stillbirths Neg Hx   . Stroke Neg Hx   . Vision loss Neg Hx   . Varicose Veins Neg Hx     Social History   Tobacco Use  . Smoking status: Former Smoker  Packs/day: 1.00    Types: Cigarettes    Quit date: 09/21/2016    Years since quitting: 4.2  . Smokeless tobacco: Never Used  Vaping Use  . Vaping Use: Never used  Substance Use Topics  . Alcohol use: No  . Drug use: No    Home Medications Prior to Admission medications   Medication Sig Start Date End Date Taking? Authorizing Provider  benzonatate (TESSALON) 200 MG capsule Take 1 capsule (200 mg total) by mouth 2 (two) times daily as needed for cough. Patient not taking: Reported on 11/18/2020 11/09/20   [name], NP  IBUPROFEN & ACETAMINOPHEN PO Take 1 tablet by mouth as needed.    [provider]  Multiple Vitamin (MULTIVITAMIN) tablet Take 1 tablet by mouth daily.    [provider]    Allergies    Shrimp [shellfish allergy]  Review of Systems   Review of Systems  Constitutional: Positive for chills, fatigue and fever.  Eyes: Negative for visual disturbance.  Respiratory: Negative for cough and shortness of breath.   Cardiovascular: Positive for chest pain.  Gastrointestinal: Positive for abdominal pain and nausea. Negative for vomiting.  Musculoskeletal: Negative for neck pain  and neck stiffness.  Skin: Negative for rash.  Neurological: Positive for headaches.  All other systems reviewed and are negative.   Physical Exam Updated Vital Signs BP 118/76 (BP Location: Left Arm)   Pulse (!) 112   Temp 99.8 F (37.7 C) (Oral)   Resp 19   LMP 11/18/2020   SpO2 100%   Physical Exam Vitals and nursing note reviewed.  Constitutional:      Appearance: She is not ill-appearing or diaphoretic.     Comments: Warm to the touch  HENT:     Head: Normocephalic and atraumatic.  Eyes:     Conjunctiva/sclera: Conjunctivae normal.  Cardiovascular:     Rate and Rhythm: Normal rate and regular rhythm.     Pulses: Normal pulses.  Pulmonary:     Effort: Pulmonary effort is normal.     Breath sounds: Normal breath sounds. No wheezing, rhonchi or rales.  Abdominal:     Palpations: Abdomen is soft.     Tenderness: There is abdominal tenderness. There is no guarding or rebound.     Comments: Soft, + epigastric TTP, +BS throughout, no r/g/r, neg murphy's, neg mcburney's, no CVA TTP  Musculoskeletal:     Cervical back: Neck supple.  Skin:    General: Skin is warm and dry.  Neurological:     Mental Status: She is alert.     ED Results / Procedures / Treatments   Labs (all labs ordered are listed, but only abnormal results are displayed) Labs Reviewed  PREGNANCY, URINE - Abnormal; Notable for the following components:      Result Value   Preg Test, Ur POSITIVE (*)    All other components within normal limits  URINALYSIS, ROUTINE W REFLEX MICROSCOPIC - Abnormal; Notable for the following components:   Color, Urine STRAW (*)    Specific Gravity, Urine 1.004 (*)    All other components within normal limits  POC SARS CORONAVIRUS 2 AG -  ED - Abnormal; Notable for the following components:   SARS Coronavirus 2 Ag POSITIVE (*)    All other components within normal limits    EKG None  Radiology No results found.  Procedures Procedures   Medications Ordered in  ED Medications  alum & mag hydroxide-simeth (MAALOX/MYLANTA) 200-200-20 MG/5ML suspension 30 mL (30 mLs  Oral Given 12/29/20 1747)    And  lidocaine (XYLOCAINE) 2 % viscous mouth solution 15 mL (15 mLs Oral Given 12/29/20 1747)    ED Course  I have reviewed the triage vital signs and the nursing notes.  Pertinent labs & imaging results that were available during my care of the patient were reviewed by me and considered in my medical decision making (see chart for details).  Clinical Course as of 12/29/20 1855  Wed Dec 29, 2020  1818 SARS Coronavirus 2 Ag(!): POSITIVE [MV]    Clinical Course User Index [MV] Tanda RockersVenter, Lacheryl Niesen, PA-C   MDM Rules/Calculators/A&P                          40 year old female who presents to the ED today with complaints of fevers, chills, headache since yesterday.  Daughter tested positive for COVID-19 recently.  Patient would also like a pregnancy test at this time, last normal menstrual period 1/06.  She also reports that she has been experiencing chest pain 15 minutes secondary to taking ibuprofen/Tylenol for her fever today.  On arrival to the ED temperature is 99.8, she is tachycardic to 112, nontachypneic, satting 100% on room air.  On exam patient has epigastric abdominal tenderness palpation.  Her symptoms sound very atypical for ACS however will obtain an EKG at this time.  She has been nauseated for the past several weeks which is why she believes she is pregnant as she has been having symptoms of nausea with previous pregnancies.  She denies any vomiting.  Her abdomen is soft.  Doubt acute abdomen at this time.  We will plan for EKG, urine pregnancy, UA, Covid test.  We will also provide GI cocktail at this time as epigastric pain may be related to acid reflux.   EKG viewed by attending physician Dr. Estell HarpinZammit - unable to put in report however sinus tachycardia appreciated without acute ischemic changes  COVID test has returned positive U/A without  infection UPT positive. Pt without complaints of abdominal pain, urinary symptoms, vaginal bleeding/discharge. Do not feel she needs additional workup for pregnancy at this time. She endorses plans to get abortion once she is done with COVID quarantine.   On reevaluation pt resting comfortably on the phone. She reports the GI cocktail helped some with her chest pain/epigastric pain; still mildly present. Given it occurred just after taking the medication I am most suspicion for GI upset from medication reaction. No other symptoms to indicate concern for ACS, myocarditis, PE. Will discharge home at this time. Given pt is currently pregnant and has COVID I have placed ambulatory referral for COVID 19 treatment. Have encouraged drinking plenty of fluids to stay  Hydrated and to only take Tylenol for fevers/body aches/any other symptoms given she is pregnant today. Pt is in understanding and stable for discharge.   This note was prepared using Dragon voice recognition software and may include unintentional dictation errors due to the inherent limitations of voice recognition software.  Belinda Mcintosh was evaluated in Emergency Department on 12/29/2020 for the symptoms described in the history of present illness. She was evaluated in the context of the global COVID-19 pandemic, which necessitated consideration that the patient might be at risk for infection with the SARS-CoV-2 virus that causes COVID-19. Institutional protocols and algorithms that pertain to the evaluation of patients at risk for COVID-19 are in a state of rapid change based on information released by regulatory bodies including the CDC  and federal and state organizations. These policies and algorithms were followed during the patient's care in the ED.  Final Clinical Impression(s) / ED Diagnoses Final diagnoses:  COVID-19  Positive pregnancy test    Rx / DC Orders ED Discharge Orders         Ordered    Ambulatory referral for Covid  Treatment        12/29/20 1835           Discharge Instructions     You have tested POSITIVE for COVID 19 today. It is recommended that you quarantine for 5 days starting today. If your symptoms are resolving on Day 6 (02/22) and you are no longer have fevers for > 24 hours without fever reducing medications then you can resume normal daily activity with an additional 5 days of mask wearing whenever around other individuals. Please see updated CDC guidelines on their website for further clarifications.   Your pregnancy test has also returned positive today. In this setting please only take Tylenol for symptoms including fevers, headache, body aches. Avoid using Ibuprofen, Advil, Aleve, Aspirin, Naproxen or any other NSAID as this is unsafe in pregnancy.   Follow up with your PCP regarding your ED visit today  Return to the ED for any worsening symptoms        Tanda Rockers, Cordelia Poche 12/29/20 Dian Situ, MD 12/29/20 2154

## 2020-12-30 ENCOUNTER — Other Ambulatory Visit: Payer: Self-pay

## 2020-12-30 ENCOUNTER — Telehealth (INDEPENDENT_AMBULATORY_CARE_PROVIDER_SITE_OTHER): Payer: 59 | Admitting: Family Medicine

## 2020-12-30 ENCOUNTER — Telehealth: Payer: Self-pay

## 2020-12-30 ENCOUNTER — Telehealth: Payer: Self-pay | Admitting: Adult Health

## 2020-12-30 ENCOUNTER — Encounter: Payer: Self-pay | Admitting: Family Medicine

## 2020-12-30 VITALS — Temp 98.0°F

## 2020-12-30 DIAGNOSIS — U071 COVID-19: Secondary | ICD-10-CM

## 2020-12-30 NOTE — Patient Instructions (Signed)

## 2020-12-30 NOTE — Telephone Encounter (Signed)
Called to discuss with patient about COVID-19 symptoms and the use of one of the available treatments for those with mild to moderate Covid symptoms and at a high risk of hospitalization.  Pt appears to qualify for outpatient treatment due to co-morbid conditions and/or a member of an at-risk group in accordance with the FDA Emergency Use Authorization.    Patient feeling much better today . She declines OP covid treatments at this time. Advised to call back if changes mind.  Follow up with PCP .  Please contact PCP  for sooner follow up if symptoms do not improve or worsen or seek emergency care   Analiz Tvedt NP -C

## 2020-12-30 NOTE — Telephone Encounter (Addendum)
Called to discuss with patient about COVID-19 symptoms and the use of one of the available treatments for those with mild to moderate Covid symptoms and at a high risk of hospitalization.  Pt appears to qualify for outpatient treatment due to co-morbid conditions and/or a member of an at-risk group in accordance with the FDA Emergency Use Authorization.    Symptom onset: 12/28/20 Headache, sore throat Vaccinated: Yes Booster? No Immunocompromised? No Qualifiers: Pregnant - first trimester, Pulmonary disease per chart  Would like to speak with APP.   Belinda Mcintosh

## 2020-12-30 NOTE — Progress Notes (Signed)
Virtual Visit Note  I connected with patient on 12/30/20 at 1500 by telephone due to unable to work Epic video visit and verified that I am speaking with the correct person using two identifiers. Belinda Mcintosh is currently located at home and no family members are currently with them during visit. The provider, Azalee Course Callyn Severtson, FNP is located in their office at time of visit.  I discussed the limitations, risks, security and privacy concerns of performing an evaluation and management service by telephone and the availability of in person appointments. I also discussed with the patient that there may be a patient responsible charge related to this service. The patient expressed understanding and agreed to proceed.   I provided 20 minutes of non-face-to-face time during this encounter.  Chief Complaint  Patient presents with  . Nasal Congestion  . Cough    Covid positive symptoms started 2 days ago - currently pregnant [redacted] weeks    HPI ? Was seen in the ED yesterday for COVID She has received 2 covid vaccines but no booster She is currently [redacted] weeks pregnant last normal menstrual period 1/06 Recently diagnosed with COVID Unsure what she can take while pregnant Was offered outpatient covid treatment but unsure if she should get this   Allergies  Allergen Reactions  . Shrimp [Shellfish Allergy] Itching    Prior to Admission medications   Medication Sig Start Date End Date Taking? Authorizing Provider  acetaminophen (TYLENOL) 325 MG tablet Take 650 mg by mouth every 6 (six) hours as needed.   Yes [provider]  Multiple Vitamin (MULTIVITAMIN) tablet Take 1 tablet by mouth daily.   Yes [provider]  Vibegron (GEMTESA) 75 MG TABS Take by mouth.   Yes [provider]  benzonatate (TESSALON) 200 MG capsule Take 1 capsule (200 mg total) by mouth 2 (two) times daily as needed for cough. Patient not taking: No sig reported 11/09/20   [name], NP   IBUPROFEN & ACETAMINOPHEN PO Take 1 tablet by mouth as needed. Patient not taking: Reported on 12/30/2020    [provider]    Past Medical History:  Diagnosis Date  . Abdominal pain   . Flank pain   . History of kidney stones   . Low back pain   . Palpitations   . Umbilical hernia 2018    Past Surgical History:  Procedure Laterality Date  . COSMETIC SURGERY    . INDUCED ABORTION    . LIPOSUCTION    . NOSE SURGERY      Social History   Tobacco Use  . Smoking status: Former Smoker    Packs/day: 1.00    Types: Cigarettes    Quit date: 09/21/2016    Years since quitting: 4.2  . Smokeless tobacco: Never Used  Substance Use Topics  . Alcohol use: No    Family History  Problem Relation Age of Onset  . Hypertension Mother   . Diabetes Mother   . Breast cancer Mother        Breast Cancer  . Cancer Father   . Hypertension Father   . Diabetes Father   . Cancer Sister   . Fibromyalgia Sister   . Other Sister        spinal stenosis  . Alcohol abuse Neg Hx   . Arthritis Neg Hx   . Asthma Neg Hx   . COPD Neg Hx   . Depression Neg Hx   . Drug abuse Neg Hx   .  Early death Neg Hx   . Heart disease Neg Hx   . Hearing loss Neg Hx   . Hyperlipidemia Neg Hx   . Kidney disease Neg Hx   . Learning disabilities Neg Hx   . Mental illness Neg Hx   . Mental retardation Neg Hx   . Miscarriages / Stillbirths Neg Hx   . Stroke Neg Hx   . Vision loss Neg Hx   . Varicose Veins Neg Hx     Review of Systems  Constitutional: Positive for malaise/fatigue. Negative for chills and fever.  HENT: Positive for congestion. Negative for sore throat.   Respiratory: Positive for cough and sputum production. Negative for shortness of breath and wheezing.   Cardiovascular: Negative for chest pain, palpitations and leg swelling.  Gastrointestinal: Positive for heartburn and nausea. Negative for abdominal pain, constipation, diarrhea and vomiting.  Musculoskeletal: Positive for  myalgias. Negative for back pain and joint pain.  Neurological: Negative for dizziness.    Objective  Constitutional:      General: Not in acute distress.    Appearance: Normal appearance. Not ill-appearing.   Pulmonary:     Effort: Pulmonary effort is normal. No respiratory distress.  Neurological:     Mental Status: Alert and oriented to person, place, and time.  Psychiatric:        Mood and Affect: Mood normal.        Behavior: Behavior normal.     ASSESSMENT and PLAN  Problem List Items Addressed This Visit   None   Visit Diagnoses    COVID-19    -  Primary      Plan . Discussed she should call outpatient to receive treatment offered . Limit medication use given pregnancy as much as possible . Discussed stopping Ibuprofen use Tylenol as needed . RTC/ED precautions provided . R/se/b of medications and treatment discussed  Return if symptoms worsen or fail to improve.    The above assessment and management plan was discussed with the patient. The patient verbalized understanding of and has agreed to the management plan. Patient is aware to call the clinic if symptoms persist or worsen. Patient is aware when to return to the clinic for a follow-up visit. Patient educated on when it is appropriate to go to the emergency department.     Macario Carls Kineta Fudala, FNP-BC Primary Care at [facility] [address] [place], [place] [address] Ph.  [phone] Fax [phone]

## 2020-12-30 NOTE — Telephone Encounter (Signed)
Transition Care Management Follow-up Telephone Call  Date of discharge and from where: 12/29/2020 from [facility]  How have you been since you were released from the hospital? Pt stated that she is feeling tired and having fevers. Pt has an appt (virtual) today with PCP office.   Any questions or concerns? No  Items Reviewed:  Did the pt receive and understand the discharge instructions provided? Yes   Medications obtained and verified? Yes   Other? No   Any new allergies since your discharge? No   Dietary orders reviewed? N/A  Do you have support at home? Yes   Functional Questionnaire: (I = Independent and D = Dependent) ADLs: I  Bathing/Dressing- I  Meal Prep- I  Eating- I  Maintaining continence- I  Transferring/Ambulation- I  Managing Meds- I   Follow up appointments reviewed:   PCP Hospital f/u appt confirmed? Yes  Scheduled to see Macario Carls Just, FNP on 12/30/2020 @ 4:40pm.  Are transportation arrangements needed? No   If their condition worsens, is the pt aware to call PCP or go to the Emergency Dept.? Yes  Was the patient provided with contact information for the PCP's office or ED? Yes  Was to pt encouraged to call back with questions or concerns? Yes

## 2021-01-07 ENCOUNTER — Telehealth: Payer: Self-pay | Admitting: Registered Nurse

## 2021-01-07 NOTE — Telephone Encounter (Signed)
Patient is pregnant (2nd pregnancy) - positive covid test almost 14 days ago. Patient is complaining of very strong pregnancy symptoms (extreme nausea, vomiting, and indigestion). Patient also has headache and lots of gas; stated she stays in bed.  Patient wants to know what medication the provider can prescribe to ease/manage her symptoms. She has an appointment with the provider on Tuesday 3/1 but stated she can't wait until then for relief. Please advise at [phone]

## 2021-01-10 NOTE — Telephone Encounter (Signed)
Attempted to call the patient to see how she was feeling today and her voicemail has not been setup. Also Patient had an appointment tomorrow with you that has now been cancelled.

## 2021-01-11 ENCOUNTER — Ambulatory Visit: Payer: 59 | Admitting: Family Medicine

## 2021-01-19 ENCOUNTER — Ambulatory Visit (INDEPENDENT_AMBULATORY_CARE_PROVIDER_SITE_OTHER): Payer: 59 | Admitting: Registered Nurse

## 2021-01-19 ENCOUNTER — Other Ambulatory Visit: Payer: Self-pay

## 2021-01-19 ENCOUNTER — Encounter: Payer: Self-pay | Admitting: Registered Nurse

## 2021-01-19 VITALS — BP 105/68 | HR 82 | Temp 98.0°F | Resp 18 | Ht 61.0 in | Wt 144.6 lb

## 2021-01-19 DIAGNOSIS — J988 Other specified respiratory disorders: Secondary | ICD-10-CM

## 2021-01-19 MED ORDER — ALBUTEROL SULFATE HFA 108 (90 BASE) MCG/ACT IN AERS: 2.0000 | INHALATION_SPRAY | Freq: Four times a day (QID) | RESPIRATORY_TRACT | 0 refills | Status: DC | PRN

## 2021-01-19 MED ORDER — AZELASTINE HCL 0.1 % NA SOLN
1.0000 | Freq: Two times a day (BID) | NASAL | 12 refills | Status: DC
Start: 2021-01-19 — End: 2021-02-08

## 2021-01-19 MED ORDER — AZITHROMYCIN 250 MG PO TABS: ORAL_TABLET | ORAL | 0 refills | Status: DC

## 2021-01-19 NOTE — Patient Instructions (Signed)
° ° ° °  If you have lab work done today you will be contacted with your lab results within the next 2 weeks.  If you have not heard from us then please contact us. The fastest way to get your results is to register for My Chart. ° ° °IF you received an x-ray today, you will receive an invoice from [facility]. Please contact [facility] at [phone] with questions or concerns regarding your invoice.  ° °IF you received labwork today, you will receive an invoice from [facility]. Please contact [facility] at [phone] with questions or concerns regarding your invoice.  ° °Our billing staff will not be able to assist you with questions regarding bills from these companies. ° °You will be contacted with the lab results as soon as they are available. The fastest way to get your results is to activate your My Chart account. Instructions are located on the last page of this paperwork. If you have not heard from us regarding the results in 2 weeks, please contact this office. °  ° ° ° °

## 2021-01-20 ENCOUNTER — Telehealth: Payer: Self-pay | Admitting: Registered Nurse

## 2021-01-20 ENCOUNTER — Telehealth: Payer: Self-pay | Admitting: Family Medicine

## 2021-01-20 NOTE — Telephone Encounter (Signed)
Pt called to schedule new ob but also states that she had Covid last month and was given a prescription and concerned that it may affect the baby or term the pregnancy. States that she is not sure if she should keep taking the meds or not. pt was ok with nurse calling back but Pt spouse kept interrupting the conversation and wanted her to be seen right then and pt kept telling him that they have to wait on nurse. Pt request a call back concerning above.  I have schedule pt for new ob thanks

## 2021-01-20 NOTE — Telephone Encounter (Signed)
Pt is having  SOB today. She is very worried. I informed pt to go to an urgent care if this gets any worse. Pt is wanting a referral to the [facility], she was seen yesterday 01/19/21 for this issue by Kateri Plummer. Pt is wondering if the two scripts we sent in are going to help with the inflammation concerning her lungs. Please advise at [phone].

## 2021-01-21 ENCOUNTER — Ambulatory Visit (INDEPENDENT_AMBULATORY_CARE_PROVIDER_SITE_OTHER): Payer: 59 | Admitting: Nurse Practitioner

## 2021-01-21 VITALS — BP 110/78 | HR 92 | Temp 97.9°F | Resp 18

## 2021-01-21 DIAGNOSIS — Z8616 Personal history of COVID-19: Secondary | ICD-10-CM | POA: Diagnosis not present

## 2021-01-21 NOTE — Telephone Encounter (Signed)
Looks like she already has appt on Monday  Thanks,  Luan Pulling

## 2021-01-21 NOTE — Telephone Encounter (Signed)
Pt wants referral to [facility]. Please advise. Was given UC/ED precautions.

## 2021-01-21 NOTE — Progress Notes (Signed)
@Patient  ID: , female    DOB: [birth year], 40 y.o.   MRN: [id number]  Chief Complaint  Patient presents with  . Covid Positive    Head congestion     Referring provider: [id number], NP  HPI  Patient presents today for post COVID care clinic.  Patient tested positive for Covid in mid February.  Patient is around [redacted] weeks pregnant.  She does have an appointment scheduled to see OB/GYN at the end of this month.  Patient states that she does have ongoing nasal congestion.  She has been using Afrin nasal spray.  Patient did have a video visit with her primary care on 01/19/2021 and was prescribed azithromycin, Astelin nasal spray, albuterol inhaler to take as needed.  She has been having cough and shortness of breath.  Patient's lungs do sound clear in office today.  O2 sats are normal on room air.  Vital signs are stable.  Patient expresses concerns about pregnancy and wants to terminate her pregnancy due to having Covid.  She has read that some women have died during pregnancy.  I tried to reassure her that with adequate medical attention she should be okay to carry her pregnancy.  She does need to get in with OB/GYN as soon as possible.  We will do a close follow-up with her on Monday to make sure she is still improving. Denies f/c/s, n/v/d, hemoptysis, PND, chest pain or edema.      Allergies  Allergen Reactions  . Shrimp [Shellfish Allergy] Itching    Immunization History  Administered Date(s) Administered  . Influenza,inj,Quad PF,6+ Mos 11/21/2016  . PFIZER(Purple Top)SARS-COV-2 Vaccination 01/28/2020, 03/03/2020  . Tdap 03/26/2017    Past Medical History:  Diagnosis Date  . Abdominal pain   . Flank pain   . History of kidney stones   . Low back pain   . Palpitations   . Umbilical hernia 2018    Tobacco History: Social History   Tobacco Use  Smoking Status Former Smoker  . Packs/day: 1.00  . Types: Cigarettes  . Quit date: 09/21/2016  . Years since  quitting: 4.3  Smokeless Tobacco Never Used   Counseling given: Yes   Outpatient Encounter Medications as of 01/21/2021  Medication Sig  . acetaminophen (TYLENOL) 325 MG tablet Take 650 mg by mouth every 6 (six) hours as needed.  03/23/2021 albuterol (VENTOLIN HFA) 108 (90 Base) MCG/ACT inhaler Inhale 2 puffs into the lungs every 6 (six) hours as needed for wheezing or shortness of breath.  Marland Kitchen azelastine (ASTELIN) 0.1 % nasal spray Place 1 spray into both nostrils 2 (two) times daily. Use in each nostril as directed  . azithromycin (ZITHROMAX) 250 MG tablet Take 2 tabs on first day. Then take 1 tab daily. Finish entire supply.  . Multiple Vitamin (MULTIVITAMIN) tablet Take 1 tablet by mouth daily.  . Vibegron (GEMTESA) 75 MG TABS Take by mouth.   No facility-administered encounter medications on file as of 01/21/2021.     Review of Systems  Review of Systems  Constitutional: Negative.  Negative for fatigue and fever.  HENT: Positive for congestion.   Respiratory: Positive for cough and shortness of breath.   Cardiovascular: Negative.  Negative for chest pain, palpitations and leg swelling.  Gastrointestinal: Negative.   Allergic/Immunologic: Negative.   Neurological: Negative.   Psychiatric/Behavioral: Negative.        Physical Exam  BP 110/78   Pulse 92   Temp 97.9 F (36.6 C)   Resp 18  SpO2 99% Comment: RA  Wt Readings from Last 5 Encounters:  01/19/21 144 lb 9.6 oz (65.6 kg)  09/17/20 140 lb 12.8 oz (63.9 kg)  07/16/20 139 lb 3.2 oz (63.1 kg)  04/30/20 142 lb 3.2 oz (64.5 kg)  03/16/20 140 lb 9.6 oz (63.8 kg)     Physical Exam Vitals and nursing note reviewed.  Constitutional:      General: She is not in acute distress.    Appearance: She is well-developed.  Cardiovascular:     Rate and Rhythm: Normal rate and regular rhythm.  Pulmonary:     Effort: Pulmonary effort is normal.     Breath sounds: Normal breath sounds.  Musculoskeletal:     Right lower leg: No  edema.     Left lower leg: No edema.  Neurological:     Mental Status: She is alert and oriented to person, place, and time.  Psychiatric:        Mood and Affect: Mood normal.        Behavior: Behavior normal.        Assessment & Plan:   History of COVID-19 Shortness of breath:   Stay well hydrated  Stay active  Deep breathing exercises  May take tylenol or fever or pain  May continue medications prescribed by PCP     Follow up:  Follow up on Monday - if symptoms persist please go to the ED      Ivonne Andrew, NP 01/24/2021

## 2021-01-21 NOTE — Patient Instructions (Signed)
Covid 19 Shortness of breath:   Stay well hydrated  Stay active  Deep breathing exercises  May take tylenol or fever or pain  May continue medications prescribed by PCP     Follow up:  Follow up on Monday - if symptoms persist please go to the ED

## 2021-01-24 ENCOUNTER — Ambulatory Visit: Payer: 59 | Admitting: Registered Nurse

## 2021-01-24 ENCOUNTER — Other Ambulatory Visit: Payer: Self-pay

## 2021-01-24 ENCOUNTER — Ambulatory Visit (INDEPENDENT_AMBULATORY_CARE_PROVIDER_SITE_OTHER): Payer: 59 | Admitting: Nurse Practitioner

## 2021-01-24 ENCOUNTER — Encounter (HOSPITAL_COMMUNITY): Payer: Self-pay | Admitting: *Deleted

## 2021-01-24 ENCOUNTER — Emergency Department (HOSPITAL_COMMUNITY): Payer: 59

## 2021-01-24 ENCOUNTER — Emergency Department (HOSPITAL_COMMUNITY)
Admission: EM | Admit: 2021-01-24 | Discharge: 2021-01-24 | Disposition: A | Discharge status: Home / Self Care | Payer: 59 | Attending: Emergency Medicine | Admitting: Emergency Medicine

## 2021-01-24 ENCOUNTER — Encounter: Payer: Self-pay | Admitting: Family Medicine

## 2021-01-24 VITALS — BP 120/88 | HR 97 | Temp 97.9°F | Resp 18

## 2021-01-24 DIAGNOSIS — O99511 Diseases of the respiratory system complicating pregnancy, first trimester: Secondary | ICD-10-CM | POA: Diagnosis not present

## 2021-01-24 DIAGNOSIS — R0602 Shortness of breath: Secondary | ICD-10-CM

## 2021-01-24 DIAGNOSIS — Z3A00 Weeks of gestation of pregnancy not specified: Secondary | ICD-10-CM | POA: Diagnosis not present

## 2021-01-24 DIAGNOSIS — R509 Fever, unspecified: Secondary | ICD-10-CM | POA: Insufficient documentation

## 2021-01-24 DIAGNOSIS — Z8616 Personal history of COVID-19: Secondary | ICD-10-CM | POA: Insufficient documentation

## 2021-01-24 DIAGNOSIS — Z87891 Personal history of nicotine dependence: Secondary | ICD-10-CM | POA: Insufficient documentation

## 2021-01-24 DIAGNOSIS — R0781 Pleurodynia: Secondary | ICD-10-CM | POA: Insufficient documentation

## 2021-01-24 DIAGNOSIS — R059 Cough, unspecified: Secondary | ICD-10-CM

## 2021-01-24 HISTORY — DX: Personal history of COVID-19: Z86.16

## 2021-01-24 LAB — CBC WITH DIFFERENTIAL/PLATELET
Abs Immature Granulocytes: 0.03 10*3/uL (ref 0.00–0.07)
Basophils Absolute: 0 10*3/uL (ref 0.0–0.1)
Basophils Relative: 0 %
Eosinophils Absolute: 0.2 10*3/uL (ref 0.0–0.5)
Eosinophils Relative: 3 %
HCT: 39.2 % (ref 36.0–46.0)
Hemoglobin: 13 g/dL (ref 12.0–15.0)
Immature Granulocytes: 0 %
Lymphocytes Relative: 21 %
Lymphs Abs: 1.7 10*3/uL (ref 0.7–4.0)
MCH: 30.7 pg (ref 26.0–34.0)
MCHC: 33.2 g/dL (ref 30.0–36.0)
MCV: 92.5 fL (ref 80.0–100.0)
Monocytes Absolute: 0.4 10*3/uL (ref 0.1–1.0)
Monocytes Relative: 5 %
Neutro Abs: 5.8 10*3/uL (ref 1.7–7.7)
Neutrophils Relative %: 71 %
Platelets: 143 10*3/uL — ABNORMAL LOW (ref 150–400)
RBC: 4.24 MIL/uL (ref 3.87–5.11)
RDW: 13.1 % (ref 11.5–15.5)
WBC: 8.1 10*3/uL (ref 4.0–10.5)
nRBC: 0 % (ref 0.0–0.2)

## 2021-01-24 LAB — COMPREHENSIVE METABOLIC PANEL
ALT: 18 U/L (ref 0–44)
AST: 15 U/L (ref 15–41)
Albumin: 3.9 g/dL (ref 3.5–5.0)
Alkaline Phosphatase: 47 U/L (ref 38–126)
Anion gap: 14 (ref 5–15)
BUN: 13 mg/dL (ref 6–20)
CO2: 17 mmol/L — ABNORMAL LOW (ref 22–32)
Calcium: 9.4 mg/dL (ref 8.9–10.3)
Chloride: 104 mmol/L (ref 98–111)
Creatinine, Ser: 0.69 mg/dL (ref 0.44–1.00)
GFR, Estimated: >60 mL/min (ref >60)
Glucose, Bld: 94 mg/dL (ref 70–99)
Potassium: 4.1 mmol/L (ref 3.5–5.1)
Sodium: 135 mmol/L (ref 135–145)
Total Bilirubin: 0.2 mg/dL — ABNORMAL LOW (ref 0.3–1.2)
Total Protein: 7.8 g/dL (ref 6.5–8.1)

## 2021-01-24 LAB — TROPONIN I (HIGH SENSITIVITY): Troponin I (High Sensitivity): <2 ng/L (ref <18)

## 2021-01-24 LAB — D-DIMER, QUANTITATIVE: D-Dimer, Quant: 0.31 ug/mL-FEU (ref 0.00–0.50)

## 2021-01-24 MED ORDER — ALBUTEROL SULFATE HFA 108 (90 BASE) MCG/ACT IN AERS: 2.0000 | INHALATION_SPRAY | RESPIRATORY_TRACT | Status: DC | PRN

## 2021-01-24 NOTE — Progress Notes (Signed)
@Patient  ID: , female    DOB: [birth year], 40 y.o.   MRN: [id number]  Chief Complaint  Patient presents with  . history of covid    Has not improved with antibiotics, pain with breathing, elevated heart rate    Referring provider: [id number], NP  HPI  Patient presents today for post COVID care clinic visit follow-up.  Patient was last seen in our office on 01/21/2021.  At that time she has started a round of azithromycin that was prescribed by her PCP.  She states that she is finishing this antibiotic today.  Patient is currently around [redacted] weeks pregnant.  She does have an appointment scheduled with OB/GYN at the end of this month.  Patient is complaining of ongoing symptoms of shortness of breath, pain with inspiration, cough, nasal congestion. O2 sats are normal on room air.  Vital signs are stable.  Patient expresses concerns about pregnancy and wants to terminate her pregnancy due to having Covid.  She has read that some women have died during pregnancy.  I tried to reassure her that with adequate medical attention she should be okay to carry her pregnancy.  She does need to get in with OB/GYN as soon as possible.  Considering symptoms worsening in patient's pregnancy status -advised patient to go on to the ED so that she can have a D-dimer and possible CTA if indicated.  Concern for possible PE. Denies f/c/s, n/v/d, hemoptysis, PND,  Edema.      Allergies  Allergen Reactions  . Shrimp [Shellfish Allergy] Itching    Immunization History  Administered Date(s) Administered  . Influenza,inj,Quad PF,6+ Mos 11/21/2016  . PFIZER(Purple Top)SARS-COV-2 Vaccination 01/28/2020, 03/03/2020  . Tdap 03/26/2017    Past Medical History:  Diagnosis Date  . Abdominal pain   . Flank pain   . History of kidney stones   . Low back pain   . Palpitations   . Umbilical hernia 2018    Tobacco History: Social History   Tobacco Use  Smoking Status Former Smoker  .  Packs/day: 1.00  . Types: Cigarettes  . Quit date: 09/21/2016  . Years since quitting: 4.3  Smokeless Tobacco Never Used   Counseling given: Yes   Outpatient Encounter Medications as of 01/24/2021  Medication Sig  . acetaminophen (TYLENOL) 325 MG tablet Take 650 mg by mouth every 6 (six) hours as needed.  01/26/2021 albuterol (VENTOLIN HFA) 108 (90 Base) MCG/ACT inhaler Inhale 2 puffs into the lungs every 6 (six) hours as needed for wheezing or shortness of breath.  Marland Kitchen azelastine (ASTELIN) 0.1 % nasal spray Place 1 spray into both nostrils 2 (two) times daily. Use in each nostril as directed  . azithromycin (ZITHROMAX) 250 MG tablet Take 2 tabs on first day. Then take 1 tab daily. Finish entire supply.  . Multiple Vitamin (MULTIVITAMIN) tablet Take 1 tablet by mouth daily.  . Vibegron (GEMTESA) 75 MG TABS Take by mouth.   No facility-administered encounter medications on file as of 01/24/2021.     Review of Systems  Review of Systems  Constitutional: Negative.  Negative for fatigue and fever.  HENT: Positive for congestion.   Respiratory: Positive for cough and shortness of breath.   Cardiovascular: Negative.  Negative for chest pain, palpitations and leg swelling.  Gastrointestinal: Negative.   Allergic/Immunologic: Negative.   Neurological: Negative.   Psychiatric/Behavioral: Negative.        Physical Exam  BP 120/88   Pulse 97   Temp 97.9 F (  36.6 C)   Resp 18   LMP 01/24/2021   SpO2 99%   Wt Readings from Last 5 Encounters:  01/24/21 144 lb (65.3 kg)  01/19/21 144 lb 9.6 oz (65.6 kg)  09/17/20 140 lb 12.8 oz (63.9 kg)  07/16/20 139 lb 3.2 oz (63.1 kg)  04/30/20 142 lb 3.2 oz (64.5 kg)     Physical Exam Vitals and nursing note reviewed.  Constitutional:      General: She is not in acute distress.    Appearance: She is well-developed.  Cardiovascular:     Rate and Rhythm: Regular rhythm. Tachycardia present.  Pulmonary:     Effort: Pulmonary effort is normal.      Breath sounds: Normal breath sounds.  Musculoskeletal:     Right lower leg: No edema.     Left lower leg: No edema.  Neurological:     Mental Status: She is alert and oriented to person, place, and time.  Psychiatric:        Mood and Affect: Mood normal.        Behavior: Behavior normal.      Imaging: DG Chest 2 View  Result Date: 01/24/2021 CLINICAL DATA:  COVID positive mid February, persistent shortness of breath, headache and chest discomfort. EXAM: CHEST - 2 VIEW COMPARISON:  05/31/2017. FINDINGS: Trachea is midline. Heart size normal. Lungs are clear. No pleural fluid. Mild pectus deformity. IMPRESSION: No acute findings. Electronically Signed   By: Leanna Battles M.D.   On: 01/24/2021 13:28     Assessment & Plan:   History of COVID-19 Shortness of breath:   Stay well hydrated  Stay active  Deep breathing exercises  May take tylenol or fever or pain  Patient states that she is not improving after antibiotic that was prescribed by PCP- having pain with deep inspiration - advised to go to the ED for further evaluation - concerned for PE  Follow up:  Follow up with PCP and OB/GYN      Ivonne Andrew, NP 01/24/2021

## 2021-01-24 NOTE — ED Provider Notes (Signed)
[facility]-EMERGENCY DEPT Provider Note   CSN: [id number] Arrival date & time: 01/24/21  1217     History Chief Complaint  Patient presents with  . Shortness of Breath    Belinda Mcintosh is a 40 y.o. female past medical history of palpitations, vocal hernia, recent COVID-19, currently pregnant who presents for evaluation of shortness of breath that has been ongoing for a week or 2.  She reports that she was diagnosed with COVID on 12/27/2020.  She had 2 Covid vaccines.  She states that she was sick for about 2 weeks.  She had a brief period where she felt better and then she started feeling sick again.  She reports having congestion, rhinorrhea, feeling like she was wheezing.  She saw her primary care doctor who stated that she may have had pneumonia and started on antibiotic which she finished today.  Patient reports that despite this antibiotic, she still felt short of breath and felt that she was having some pleuritic chest pain.  She states she noticed the shortness of breath more when she would talk or when she would start coughing.  She states whenever she would take a deep breath in, it would hurt on the right side and middle of her lung.  She saw the COVID care clinic today for follow-up.  She told them their symptoms and was instructed to go to the emergency department for further evaluation.  She reports her last menstrual cycle was 11/18/2020.  She is currently pregnant.  She has never had any prior history of DVT, PE.  She is not on any birth control, hormonal replacement therapy, denies any leg swelling, recent surgeries, history of cancer, smoking, recent travel, recent hospitalizations.  She states she is still had a low-grade fever of 99.5.  She reports that she has not had any abdominal pain, nausea/vomiting, leg swelling.  The history is provided by the patient.       Past Medical History:  Diagnosis Date  . Abdominal pain   . Flank pain   . History of  kidney stones   . Low back pain   . Palpitations   . Umbilical hernia 2018    Patient Active Problem List   Diagnosis Date Noted  . History of COVID-19 01/24/2021  . PVC (premature ventricular contraction) 07/16/2020  . Genital warts complicating pregnancy, third trimester 04/30/2017  . Umbilical hernia 02/07/2017    Past Surgical History:  Procedure Laterality Date  . COSMETIC SURGERY    . INDUCED ABORTION    . LIPOSUCTION    . NOSE SURGERY       OB History    Gravida  3   Para  1   Term  1   Preterm  0   AB  1   Living  1     SAB  0   IAB  1   Ectopic  0   Multiple  0   Live Births  1           Family History  Problem Relation Age of Onset  . Hypertension Mother   . Diabetes Mother   . Breast cancer Mother        Breast Cancer  . Cancer Father   . Hypertension Father   . Diabetes Father   . Cancer Sister   . Fibromyalgia Sister   . Other Sister        spinal stenosis  . Alcohol abuse Neg Hx   .  Arthritis Neg Hx   . Asthma Neg Hx   . COPD Neg Hx   . Depression Neg Hx   . Drug abuse Neg Hx   . Early death Neg Hx   . Heart disease Neg Hx   . Hearing loss Neg Hx   . Hyperlipidemia Neg Hx   . Kidney disease Neg Hx   . Learning disabilities Neg Hx   . Mental illness Neg Hx   . Mental retardation Neg Hx   . Miscarriages / Stillbirths Neg Hx   . Stroke Neg Hx   . Vision loss Neg Hx   . Varicose Veins Neg Hx     Social History   Tobacco Use  . Smoking status: Former Smoker    Packs/day: 1.00    Types: Cigarettes    Quit date: 09/21/2016    Years since quitting: 4.3  . Smokeless tobacco: Never Used  Vaping Use  . Vaping Use: Never used  Substance Use Topics  . Alcohol use: No  . Drug use: No    Home Medications Prior to Admission medications   Medication Sig Start Date End Date Taking? Authorizing Provider  acetaminophen (TYLENOL) 325 MG tablet Take 650 mg by mouth every 6 (six) hours as needed.    [provider]   albuterol (VENTOLIN HFA) 108 (90 Base) MCG/ACT inhaler Inhale 2 puffs into the lungs every 6 (six) hours as needed for wheezing or shortness of breath. 01/19/21   [name], NP  azelastine (ASTELIN) 0.1 % nasal spray Place 1 spray into both nostrils 2 (two) times daily. Use in each nostril as directed 01/19/21   [name], NP  azithromycin (ZITHROMAX) 250 MG tablet Take 2 tabs on first day. Then take 1 tab daily. Finish entire supply. 01/23/21   [name], NP  Multiple Vitamin (MULTIVITAMIN) tablet Take 1 tablet by mouth daily.    [provider]  Vibegron (GEMTESA) 75 MG TABS Take by mouth.    [provider]    Allergies    Shrimp [shellfish allergy]  Review of Systems   Review of Systems  Constitutional: Negative for fever.  Respiratory: Positive for shortness of breath. Negative for cough.   Cardiovascular: Positive for chest pain. Negative for leg swelling.  Gastrointestinal: Negative for abdominal pain, nausea and vomiting.  Genitourinary: Negative for dysuria and hematuria.  Neurological: Negative for headaches.  All other systems reviewed and are negative.   Physical Exam Updated Vital Signs BP 114/60   Pulse 80   Temp 97.6 F (36.4 C) (Oral)   Resp (!) 26   Ht 5\' 1"  (1.549 m)   Wt 65.3 kg   LMP 01/24/2021   SpO2 95%   BMI 27.21 kg/m   Physical Exam Vitals and nursing note reviewed.  Constitutional:      Appearance: Normal appearance. She is well-developed.  HENT:     Head: Normocephalic and atraumatic.  Eyes:     General: Lids are normal.     Conjunctiva/sclera: Conjunctivae normal.     Pupils: Pupils are equal, round, and reactive to light.  Cardiovascular:     Rate and Rhythm: Normal rate and regular rhythm.     Pulses: Normal pulses.     Heart sounds: Normal heart sounds. No murmur heard. No friction rub. No gallop.   Pulmonary:     Effort: Pulmonary effort is normal.     Breath sounds: Normal breath sounds.      Comments: Very minimal expiratory wheezing noted.  No evidence of respiratory distress. No rales.  Abdominal:     Palpations: Abdomen is soft. Abdomen is not rigid.     Tenderness: There is no abdominal tenderness. There is no guarding.  Musculoskeletal:        General: Normal range of motion.     Cervical back: Full passive range of motion without pain.     Comments: BLE are symmetric in appearance without any overlying warmth, erythema, edema.   Skin:    General: Skin is warm and dry.     Capillary Refill: Capillary refill takes less than 2 seconds.  Neurological:     Mental Status: She is alert and oriented to person, place, and time.  Psychiatric:        Speech: Speech normal.     ED Results / Procedures / Treatments   Labs (all labs ordered are listed, but only abnormal results are displayed) Labs Reviewed  COMPREHENSIVE METABOLIC PANEL - Abnormal; Notable for the following components:      Result Value   CO2 17 (*)    Total Bilirubin 0.2 (*)    All other components within normal limits  CBC WITH DIFFERENTIAL/PLATELET - Abnormal; Notable for the following components:   Platelets 143 (*)    All other components within normal limits  D-DIMER, QUANTITATIVE  TROPONIN I (HIGH SENSITIVITY)    EKG EKG Interpretation  Date/Time:  Monday January 24 2021 12:28:42 EDT Ventricular Rate:  84 PR Interval:    QRS Duration: 71 QT Interval:  373 QTC Calculation: 441 R Axis:   46 Text Interpretation: Sinus rhythm Low voltage, precordial leads Borderline T abnormalities, anterior leads since last tracing no significant change Confirmed by Mancel BaleWentz, Elliott [phone]([id number]) on 01/24/2021 12:51:26 PM   Radiology DG Chest 2 View  Result Date: 01/24/2021 CLINICAL DATA:  COVID positive mid February, persistent shortness of breath, headache and chest discomfort. EXAM: CHEST - 2 VIEW COMPARISON:  05/31/2017. FINDINGS: Trachea is midline. Heart size normal. Lungs are clear. No pleural fluid. Mild pectus  deformity. IMPRESSION: No acute findings. Electronically Signed   By: Leanna BattlesMelinda  Blietz M.D.   On: 01/24/2021 13:28    Procedures Procedures   Medications Ordered in ED Medications  albuterol (VENTOLIN HFA) 108 (90 Base) MCG/ACT inhaler 2 puff (has no administration in time range)    ED Course  I have reviewed the triage vital signs and the nursing notes.  Pertinent labs & imaging results that were available during my care of the patient were reviewed by me and considered in my medical decision making (see chart for details).    MDM Rules/Calculators/A&P                          40 year old female who presents for evaluation of shortness of breath, cough, chest pain.  She reports recent COVID-19 diagnosis on 12/27/2020.  She reports that she felt better and then started having some worsening symptoms.  She started having some pleuritic chest pain as well as some worsening shortness of breath.  Saw COVID care clinic today and they advised her to come to the emergency department to rule out a blood clot.  On initial arrival, she is afebrile, toxic appearing.  Vital signs are stable.  She does have some mild expiratory wheezing noted.  No evidence of respiratory distress.  She is not tachycardic or hypoxic here in the ED.  I discussed at length with patient regarding her risk of PE.  We  did discuss that both pregnancy and COVID-19 can increase to her risk of a blood clot.  We discussed at length regarding work-up here in the ED, including obtaining chest x-ray and labs for evaluation.  We also discussed obtaining a D-dimer.  I discussed with her that there could be other things that cause it to be elevated besides a blood clot.  She understands the risk of that.  We also discussed that the next definitive testing would be a CTA of the chest which would pose some radiation exposure.  Both myself and ED attending were present for this conversation.  Patient expresses full understanding of risk versus  benefits and wishes to proceed with D-dimer.  D-dimer is negative.  Troponin is negative.  CBC shows no leukocytosis or anemia.  CMP shows normal BUN/creatinine.  Chest x-ray negative for any infectious etiology.  Patient has had reassuring vitals while being here in the ED.  She has not had any hypoxia.  Patient with no significant cardiac risk factors.  She only has pain with deep inspiration.  1 troponin is sufficient for ACS rule out.  I discussed with patient and husband regarding her findings here today.  I discussed with her this could be likely continued inflammation from her Covid 19 infection versus viral.  At this time, no indication for any treatment. At this time, patient exhibits no emergent life-threatening condition that require further evaluation in ED. Patient had ample opportunity for questions and discussion. All patient's questions were answered with full understanding. Strict return precautions discussed. Patient expresses understanding and agreement to plan.   Portions of this note were generated with Scientist, clinical (histocompatibility and immunogenetics). Dictation errors may occur despite best attempts at proofreading.   Final Clinical Impression(s) / ED Diagnoses Final diagnoses:  Cough  Shortness of breath    Rx / DC Orders ED Discharge Orders    None       Rosana Hoes 01/24/21 2353    Milagros Loll, MD 01/28/21 [phone]

## 2021-01-24 NOTE — Patient Instructions (Signed)
Covid 19 Shortness of breath:   Stay well hydrated  Stay active  Deep breathing exercises  May take tylenol or fever or pain  Patient states that she is not improving after antibiotic that was prescribed by PCP- having pain with deep inspiration - advised to go to the ED for further evaluation - concerned for PE  Follow up:  Follow up with PCP and OB/GYN

## 2021-01-24 NOTE — Assessment & Plan Note (Signed)
Shortness of breath:   Stay well hydrated  Stay active  Deep breathing exercises  May take tylenol or fever or pain  Patient states that she is not improving after antibiotic that was prescribed by PCP- having pain with deep inspiration - advised to go to the ED for further evaluation - concerned for PE  Follow up:  Follow up with PCP and OB/GYN

## 2021-01-24 NOTE — ED Notes (Signed)
Pt husband asking for updates multiple times, provider made aware

## 2021-01-24 NOTE — ED Triage Notes (Signed)
Pt saw her "Covid Doctor" today, she was Covid + mid Feb and continues to have some long term symptoms. C/o shob, headache, chest discomfort. She completed antibiotics today. Sent here for further evaluation.

## 2021-01-24 NOTE — Assessment & Plan Note (Signed)
Shortness of breath:   Stay well hydrated  Stay active  Deep breathing exercises  May take tylenol or fever or pain  May continue medications prescribed by PCP     Follow up:  Follow up on Monday - if symptoms persist please go to the ED

## 2021-01-24 NOTE — ED Notes (Signed)
Provider at bedside

## 2021-01-24 NOTE — Discharge Instructions (Signed)
As we discussed, your exam today was reassuring.   Your blood test was reassuring and your chest x-ray looked reassuring.  As we discussed, you can take Tylenol as needed for symptoms.  Return the emergency department for any chest pain, difficulty breathing, vomiting or any other worsening concerning symptoms.

## 2021-01-24 NOTE — Telephone Encounter (Signed)
Called pt who states she is on the way to the emergency room to be evaluated for trouble breathing, based on a recommendation from her PCP. Per chart review there is concern for PE. Pt's husband is interrupting patient during phone call; husband says he wants patient to be seen in our office. I explained to patient and husband that the only place who can evaluate her for breathing complications is the emergency room. Pt is concerned COVID will have negative effect on pregnancy; I explained that the best thing to do now is to take care of herself and continue to manage COVID symptoms. Reassured pt and husband that her new OB appt is appropriately scheduled and she doesn't need to be seen early for pregnancy.   Pt concerned about taking Gemtesa during pregnancy for interstitial cystitis. Reviewed with Terri NP and [facility], who finds no studies showing effects on human pregnancy; state that pt may make decision to continue if this is helpful to her given that no negative effects have been observed. Explained to pt who states she will continue taking when she has pain.

## 2021-01-25 ENCOUNTER — Ambulatory Visit: Payer: 59 | Admitting: Registered Nurse

## 2021-01-29 ENCOUNTER — Encounter (HOSPITAL_COMMUNITY): Payer: Self-pay

## 2021-01-29 ENCOUNTER — Other Ambulatory Visit: Payer: Self-pay

## 2021-01-29 ENCOUNTER — Emergency Department (HOSPITAL_COMMUNITY)
Admission: EM | Admit: 2021-01-29 | Discharge: 2021-01-29 | Disposition: A | Discharge status: Home / Self Care | Payer: 59 | Attending: Emergency Medicine | Admitting: Emergency Medicine

## 2021-01-29 ENCOUNTER — Emergency Department (HOSPITAL_COMMUNITY): Payer: 59

## 2021-01-29 DIAGNOSIS — R0602 Shortness of breath: Secondary | ICD-10-CM | POA: Diagnosis not present

## 2021-01-29 DIAGNOSIS — Z8616 Personal history of COVID-19: Secondary | ICD-10-CM | POA: Insufficient documentation

## 2021-01-29 DIAGNOSIS — Z87891 Personal history of nicotine dependence: Secondary | ICD-10-CM | POA: Diagnosis not present

## 2021-01-29 DIAGNOSIS — R072 Precordial pain: Secondary | ICD-10-CM | POA: Insufficient documentation

## 2021-01-29 DIAGNOSIS — R079 Chest pain, unspecified: Secondary | ICD-10-CM

## 2021-01-29 LAB — BASIC METABOLIC PANEL
Anion gap: 7 (ref 5–15)
BUN: 10 mg/dL (ref 6–20)
CO2: 22 mmol/L (ref 22–32)
Calcium: 9 mg/dL (ref 8.9–10.3)
Chloride: 104 mmol/L (ref 98–111)
Creatinine, Ser: 0.55 mg/dL (ref 0.44–1.00)
GFR, Estimated: >60 mL/min (ref >60)
Glucose, Bld: 87 mg/dL (ref 70–99)
Potassium: 3.8 mmol/L (ref 3.5–5.1)
Sodium: 133 mmol/L — ABNORMAL LOW (ref 135–145)

## 2021-01-29 LAB — CBC
HCT: 38.1 % (ref 36.0–46.0)
Hemoglobin: 12.7 g/dL (ref 12.0–15.0)
MCH: 30.6 pg (ref 26.0–34.0)
MCHC: 33.3 g/dL (ref 30.0–36.0)
MCV: 91.8 fL (ref 80.0–100.0)
Platelets: 142 10*3/uL — ABNORMAL LOW (ref 150–400)
RBC: 4.15 MIL/uL (ref 3.87–5.11)
RDW: 13.2 % (ref 11.5–15.5)
WBC: 8 10*3/uL (ref 4.0–10.5)
nRBC: 0 % (ref 0.0–0.2)

## 2021-01-29 LAB — I-STAT BETA HCG BLOOD, ED (NOT ORDERABLE): I-stat hCG, quantitative: >2000 m[IU]/mL — ABNORMAL HIGH (ref <5)

## 2021-01-29 LAB — TROPONIN I (HIGH SENSITIVITY): Troponin I (High Sensitivity): <2 ng/L (ref <18)

## 2021-01-29 LAB — D-DIMER, QUANTITATIVE: D-Dimer, Quant: 0.32 ug/mL-FEU (ref 0.00–0.50)

## 2021-01-29 MED ORDER — ALUMINUM HYDROXIDE GEL 320 MG/5ML PO SUSP: 30.0000 mL | Freq: Once | ORAL | Status: DC

## 2021-01-29 MED ORDER — ALUM & MAG HYDROXIDE-SIMETH 200-200-20 MG/5ML PO SUSP
30.0000 mL | ORAL | Status: AC
  Administered 2021-01-29: 30 mL via ORAL
  Filled 2021-01-29: qty 30

## 2021-01-29 MED ORDER — SUCRALFATE 1 G PO TABS: 1.0000 g | ORAL_TABLET | Freq: Two times a day (BID) | ORAL | 0 refills | Status: DC | PRN

## 2021-01-29 NOTE — Discharge Instructions (Signed)
Your D-dimer was negative, I feel your symptoms are not consistent with a blood clot in the lung.  Your troponin was also completely negative.  Please try the medication I have prescribed.  I have given you information to try and follow-up with a pulmonologist.  Return for worsening symptoms or if you realize any occur when you exert herself.  Try to avoid things that may make this worse, most commonly these are spicy foods tomato based products fatty foods chocolate and peppermint.  Alcohol and tobacco can also make this worse.  Return to the emergency department for sudden worsening pain fever or inability to eat or drink.

## 2021-01-29 NOTE — ED Provider Notes (Signed)
[facility]-EMERGENCY DEPT Provider Note   CSN: [id number] Arrival date & time: 01/29/21  1351     History Chief Complaint  Patient presents with  . Chest Pain    Belinda Mcintosh is a 40 y.o. female.  40 yo F with a chief complaints of chest pain.  She describes this as an ache to the substernal area and radiates to her back.  Was worse with breathing.  Happened this morning when she was playing with her daughter.  Was sitting on the floor and playing and suddenly felt some discomfort.  Lasted for about 15 minutes or so and then completely resolved.  She has not had any exertional symptoms.  She was seen about a week ago and there is some concern by the Covid clinic that she may have a pulmonary embolism as she had been having persistent shortness of breath post having Covid about a month ago.  She was seen here and had a negative work-up and was discharged home.  She denies any history of PE or DVT denies hemoptysis denies unilateral lower extremity edema denies recent surgery mobilization or hospitalization.  She is newly pregnant.  Has had an ultrasound and she had tried to have the pregnancy terminated but she wanted to do it under anesthesia and was told that was not an option currently.  The history is provided by the patient.  Chest Pain Pain location:  Substernal area Pain quality: aching   Pain radiates to:  Mid back Pain severity:  Moderate Onset quality:  Gradual Duration:  2 weeks Timing:  Constant Progression:  Worsening Chronicity:  New Relieved by:  Nothing Worsened by:  Nothing Ineffective treatments:  None tried Associated symptoms: shortness of breath   Associated symptoms: no dizziness, no fever, no headache, no nausea, no palpitations and no vomiting        Past Medical History:  Diagnosis Date  . Abdominal pain   . Flank pain   . History of kidney stones   . Low back pain   . Palpitations   . Umbilical hernia 2018    Patient  Active Problem List   Diagnosis Date Noted  . History of COVID-19 01/24/2021  . PVC (premature ventricular contraction) 07/16/2020  . Genital warts complicating pregnancy, third trimester 04/30/2017  . Umbilical hernia 02/07/2017    Past Surgical History:  Procedure Laterality Date  . COSMETIC SURGERY    . INDUCED ABORTION    . LIPOSUCTION    . NOSE SURGERY       OB History    Gravida  3   Para  1   Term  1   Preterm  0   AB  1   Living  1     SAB  0   IAB  1   Ectopic  0   Multiple  0   Live Births  1           Family History  Problem Relation Age of Onset  . Hypertension Mother   . Diabetes Mother   . Breast cancer Mother        Breast Cancer  . Cancer Father   . Hypertension Father   . Diabetes Father   . Cancer Sister   . Fibromyalgia Sister   . Other Sister        spinal stenosis  . Alcohol abuse Neg Hx   . Arthritis Neg Hx   . Asthma Neg Hx   . COPD  Neg Hx   . Depression Neg Hx   . Drug abuse Neg Hx   . Early death Neg Hx   . Heart disease Neg Hx   . Hearing loss Neg Hx   . Hyperlipidemia Neg Hx   . Kidney disease Neg Hx   . Learning disabilities Neg Hx   . Mental illness Neg Hx   . Mental retardation Neg Hx   . Miscarriages / Stillbirths Neg Hx   . Stroke Neg Hx   . Vision loss Neg Hx   . Varicose Veins Neg Hx     Social History   Tobacco Use  . Smoking status: Former Smoker    Packs/day: 1.00    Types: Cigarettes    Quit date: 09/21/2016    Years since quitting: 4.3  . Smokeless tobacco: Never Used  Vaping Use  . Vaping Use: Never used  Substance Use Topics  . Alcohol use: No  . Drug use: No    Home Medications Prior to Admission medications   Medication Sig Start Date End Date Taking? Authorizing Provider  acetaminophen (TYLENOL) 500 MG tablet Take 500 mg by mouth every 6 (six) hours as needed for headache.   Yes [provider]  azelastine (ASTELIN) 0.1 % nasal spray Place 1 spray into both nostrils 2  (two) times daily. Use in each nostril as directed Patient taking differently: Place 1 spray into both nostrils 2 (two) times daily as needed for rhinitis or allergies. Use in each nostril as directed 01/19/21  Yes [name], NP  Prenatal Vit-Fe Fumarate-FA (PRENATAL MULTIVITAMIN) TABS tablet Take 1 tablet by mouth daily at 12 noon.   Yes [provider]  sucralfate (CARAFATE) 1 g tablet Take 1 tablet (1 g total) by mouth 2 (two) times daily as needed. 01/29/21  Yes Melene Plan, DO  Vibegron (GEMTESA) 75 MG TABS Take 75 mg by mouth daily as needed (pain/cystitis).   Yes [provider]  albuterol (VENTOLIN HFA) 108 (90 Base) MCG/ACT inhaler Inhale 2 puffs into the lungs every 6 (six) hours as needed for wheezing or shortness of breath. Patient not taking: No sig reported 01/19/21   [name], NP  azithromycin (ZITHROMAX) 250 MG tablet Take 2 tabs on first day. Then take 1 tab daily. Finish entire supply. Patient not taking: No sig reported 01/23/21   [name], NP    Allergies    Shrimp [shellfish allergy]  Review of Systems   Review of Systems  Constitutional: Negative for chills and fever.  HENT: Negative for congestion and rhinorrhea.   Eyes: Negative for redness and visual disturbance.  Respiratory: Positive for shortness of breath. Negative for wheezing.   Cardiovascular: Positive for chest pain. Negative for palpitations.  Gastrointestinal: Negative for nausea and vomiting.  Genitourinary: Negative for dysuria and urgency.  Musculoskeletal: Negative for arthralgias and myalgias.  Skin: Negative for pallor and wound.  Neurological: Negative for dizziness and headaches.    Physical Exam Updated Vital Signs BP 105/65   Pulse 80   Temp 98 F (36.7 C) (Oral)   Resp (!) 28   SpO2 98%   Physical Exam Vitals and nursing note reviewed.  Constitutional:      General: She is not in acute distress.    Appearance: She is well-developed. She is not  diaphoretic.  HENT:     Head: Normocephalic and atraumatic.  Eyes:     Pupils: Pupils are equal, round, and reactive to light.  Cardiovascular:  Rate and Rhythm: Normal rate and regular rhythm.     Heart sounds: No murmur heard. No friction rub. No gallop.   Pulmonary:     Effort: Pulmonary effort is normal.     Breath sounds: No wheezing, rhonchi or rales.  Abdominal:     General: There is no distension.     Palpations: Abdomen is soft.     Tenderness: There is no abdominal tenderness.  Musculoskeletal:        General: No tenderness.     Cervical back: Normal range of motion and neck supple.  Skin:    General: Skin is warm and dry.  Neurological:     Mental Status: She is alert and oriented to person, place, and time.  Psychiatric:        Behavior: Behavior normal.     ED Results / Procedures / Treatments   Labs (all labs ordered are listed, but only abnormal results are displayed) Labs Reviewed  BASIC METABOLIC PANEL - Abnormal; Notable for the following components:      Result Value   Sodium 133 (*)    All other components within normal limits  CBC - Abnormal; Notable for the following components:   Platelets 142 (*)    All other components within normal limits  I-STAT BETA HCG BLOOD, ED (NOT ORDERABLE) - Abnormal; Notable for the following components:   I-stat hCG, quantitative >2,000.0 (*)    All other components within normal limits  D-DIMER, QUANTITATIVE  D-DIMER, QUANTITATIVE  I-STAT BETA HCG BLOOD, ED ([facility], [facility], [facility] ONLY)  TROPONIN I (HIGH SENSITIVITY)  TROPONIN I (HIGH SENSITIVITY)    EKG EKG Interpretation  Date/Time:  Saturday January 29 2021 14:06:47 EDT Ventricular Rate:  92 PR Interval:    QRS Duration: 75 QT Interval:  377 QTC Calculation: 467 R Axis:   48 Text Interpretation: Sinus rhythm Low voltage, precordial leads Abnormal R-wave progression, early transition Nonspecific T abnormalities, anterior leads 12 Lead; Mason-Likar No significant  change since last tracing Confirmed by Linwood Dibbles [id number]) on 01/29/2021 2:12:40 PM   Radiology DG Chest 2 View  Result Date: 01/29/2021 CLINICAL DATA:  Chest pain EXAM: CHEST - 2 VIEW COMPARISON:  01/24/2021 FINDINGS: The heart size and mediastinal contours are within normal limits. Both lungs are clear. The visualized skeletal structures are unremarkable. IMPRESSION: No active cardiopulmonary disease. Electronically Signed   By: Gaylyn Rong M.D.   On: 01/29/2021 14:52    Procedures Procedures   Medications Ordered in ED Medications  alum & mag hydroxide-simeth (MAALOX/MYLANTA) 200-200-20 MG/5ML suspension 30 mL (has no administration in time range)    ED Course  I have reviewed the triage vital signs and the nursing notes.  Pertinent labs & imaging results that were available during my care of the patient were reviewed by me and considered in my medical decision making (see chart for details).    MDM Rules/Calculators/A&P                          40 yo F with a chief complaints of chest pain this is atypical in nature and resolve spontaneously.  Lasted for about 15 minutes while at rest.  She has had no continued pain with this.  Has been having shortness of breath off and on since she had the coronavirus.  She is concerned that she has a blood clot in her lung.  I feel her symptoms are very atypical and completely resolved she  is not tachycardic her oxygen saturation is 100% on room air.  I felt that no further work-up was needed.  The patient unfortunately has been researching heavily on the Internet and had had a D-dimer at her prior visit.  She is requesting a D-dimer here today.  I discussed with her the limitations of that test and with her being pregnant and also battling a prolonged upper respiratory illness that may lead to a elevation of that test.  She felt that she still needed this test.  Her chest x-ray is viewed by me without focal infiltrate or pneumothorax.  EKG  without concerning finding.  D-dimer negative.  Troponin negative.  Will discharge the patient home.  PCP OB pulmonology follow-up.  4:05 PM:  I have discussed the diagnosis/risks/treatment options with the patient and believe the pt to be eligible for discharge home to follow-up with PCP. We also discussed returning to the ED immediately if new or worsening sx occur. We discussed the sx which are most concerning (e.g., sudden worsening pain, fever, inability to tolerate by mouth) that necessitate immediate return. Medications administered to the patient during their visit and any new prescriptions provided to the patient are listed below.  Medications given during this visit Medications  alum & mag hydroxide-simeth (MAALOX/MYLANTA) 200-200-20 MG/5ML suspension 30 mL (has no administration in time range)     The patient appears reasonably screen and/or stabilized for discharge and I doubt any other medical condition or other [facility] requiring further screening, evaluation, or treatment in the ED at this time prior to discharge.   Final Clinical Impression(s) / ED Diagnoses Final diagnoses:  Nonspecific chest pain    Rx / DC Orders ED Discharge Orders         Ordered    sucralfate (CARAFATE) 1 g tablet  2 times daily PRN        01/29/21 1601           Melene Plan, DO 01/29/21 1605

## 2021-01-29 NOTE — ED Triage Notes (Signed)
Pt presents with c/o chest pain. Pt reports she had Covid back in February and is having some long term symptoms, has been seen for same. Pt was here last week and told that she cannot have Prednisone because she is pregnant. Pt continues to say "I feel terrible, I can't take the medicine since I'm pregnant and it's making me not want this baby anymore." Pt has been researching on the internet and is concerned that she has a blood clot. Pt reports the chest pain started last night. Pt is very tearful in triage.

## 2021-01-31 ENCOUNTER — Ambulatory Visit: Payer: 59 | Admitting: Registered Nurse

## 2021-01-31 ENCOUNTER — Emergency Department (HOSPITAL_COMMUNITY): Payer: 59

## 2021-01-31 ENCOUNTER — Ambulatory Visit: Payer: 59 | Admitting: Clinical

## 2021-01-31 ENCOUNTER — Emergency Department (HOSPITAL_COMMUNITY)
Admission: EM | Admit: 2021-01-31 | Discharge: 2021-01-31 | Disposition: A | Discharge status: Home / Self Care | Payer: 59 | Attending: Emergency Medicine | Admitting: Emergency Medicine

## 2021-01-31 ENCOUNTER — Encounter: Payer: Self-pay | Admitting: Family Medicine

## 2021-01-31 ENCOUNTER — Other Ambulatory Visit: Payer: Self-pay

## 2021-01-31 ENCOUNTER — Encounter (HOSPITAL_COMMUNITY): Payer: Self-pay

## 2021-01-31 ENCOUNTER — Ambulatory Visit (INDEPENDENT_AMBULATORY_CARE_PROVIDER_SITE_OTHER): Payer: 59 | Admitting: Family Medicine

## 2021-01-31 VITALS — BP 104/64 | HR 83 | Temp 98.4°F | Resp 16 | Ht 61.0 in | Wt 144.2 lb

## 2021-01-31 DIAGNOSIS — R1013 Epigastric pain: Secondary | ICD-10-CM | POA: Insufficient documentation

## 2021-01-31 DIAGNOSIS — O26891 Other specified pregnancy related conditions, first trimester: Secondary | ICD-10-CM | POA: Diagnosis present

## 2021-01-31 DIAGNOSIS — R000 Tachycardia, unspecified: Secondary | ICD-10-CM | POA: Diagnosis not present

## 2021-01-31 DIAGNOSIS — R0789 Other chest pain: Secondary | ICD-10-CM

## 2021-01-31 DIAGNOSIS — Z8616 Personal history of COVID-19: Secondary | ICD-10-CM

## 2021-01-31 DIAGNOSIS — K219 Gastro-esophageal reflux disease without esophagitis: Secondary | ICD-10-CM | POA: Diagnosis not present

## 2021-01-31 DIAGNOSIS — F430 Acute stress reaction: Secondary | ICD-10-CM

## 2021-01-31 DIAGNOSIS — Z3A14 14 weeks gestation of pregnancy: Secondary | ICD-10-CM | POA: Diagnosis not present

## 2021-01-31 DIAGNOSIS — R059 Cough, unspecified: Secondary | ICD-10-CM | POA: Diagnosis not present

## 2021-01-31 DIAGNOSIS — R1011 Right upper quadrant pain: Secondary | ICD-10-CM

## 2021-01-31 DIAGNOSIS — R0981 Nasal congestion: Secondary | ICD-10-CM | POA: Diagnosis not present

## 2021-01-31 DIAGNOSIS — Z87891 Personal history of nicotine dependence: Secondary | ICD-10-CM | POA: Insufficient documentation

## 2021-01-31 LAB — COMPREHENSIVE METABOLIC PANEL
ALT: 17 U/L (ref 0–44)
AST: 14 U/L — ABNORMAL LOW (ref 15–41)
Albumin: 3.7 g/dL (ref 3.5–5.0)
Alkaline Phosphatase: 47 U/L (ref 38–126)
Anion gap: 7 (ref 5–15)
BUN: 11 mg/dL (ref 6–20)
CO2: 21 mmol/L — ABNORMAL LOW (ref 22–32)
Calcium: 9.1 mg/dL (ref 8.9–10.3)
Chloride: 107 mmol/L (ref 98–111)
Creatinine, Ser: 0.49 mg/dL (ref 0.44–1.00)
GFR, Estimated: >60 mL/min (ref >60)
Glucose, Bld: 115 mg/dL — ABNORMAL HIGH (ref 70–99)
Potassium: 3.8 mmol/L (ref 3.5–5.1)
Sodium: 135 mmol/L (ref 135–145)
Total Bilirubin: 0.6 mg/dL (ref 0.3–1.2)
Total Protein: 7.1 g/dL (ref 6.5–8.1)

## 2021-01-31 LAB — URINALYSIS, ROUTINE W REFLEX MICROSCOPIC
Bilirubin Urine: NEGATIVE
Glucose, UA: NEGATIVE mg/dL
Hgb urine dipstick: NEGATIVE
Ketones, ur: NEGATIVE mg/dL
Leukocytes,Ua: NEGATIVE
Nitrite: NEGATIVE
Protein, ur: NEGATIVE mg/dL
Specific Gravity, Urine: 1.009 (ref 1.005–1.030)
pH: 6 (ref 5.0–8.0)

## 2021-01-31 LAB — CBC WITH DIFFERENTIAL/PLATELET
Abs Immature Granulocytes: 0.03 10*3/uL (ref 0.00–0.07)
Basophils Absolute: 0 10*3/uL (ref 0.0–0.1)
Basophils Relative: 0 %
Eosinophils Absolute: 0.1 10*3/uL (ref 0.0–0.5)
Eosinophils Relative: 2 %
HCT: 36.8 % (ref 36.0–46.0)
Hemoglobin: 12.3 g/dL (ref 12.0–15.0)
Immature Granulocytes: 0 %
Lymphocytes Relative: 21 %
Lymphs Abs: 1.7 10*3/uL (ref 0.7–4.0)
MCH: 30.5 pg (ref 26.0–34.0)
MCHC: 33.4 g/dL (ref 30.0–36.0)
MCV: 91.3 fL (ref 80.0–100.0)
Monocytes Absolute: 0.4 10*3/uL (ref 0.1–1.0)
Monocytes Relative: 5 %
Neutro Abs: 5.8 10*3/uL (ref 1.7–7.7)
Neutrophils Relative %: 72 %
Platelets: 139 10*3/uL — ABNORMAL LOW (ref 150–400)
RBC: 4.03 MIL/uL (ref 3.87–5.11)
RDW: 12.9 % (ref 11.5–15.5)
WBC: 8.1 10*3/uL (ref 4.0–10.5)
nRBC: 0 % (ref 0.0–0.2)

## 2021-01-31 LAB — LIPASE, BLOOD: Lipase: 27 U/L (ref 11–51)

## 2021-01-31 LAB — D-DIMER, QUANTITATIVE: D-Dimer, Quant: 0.47 ug/mL-FEU (ref 0.00–0.50)

## 2021-01-31 LAB — TROPONIN I (HIGH SENSITIVITY)
Troponin I (High Sensitivity): <2 ng/L (ref <18)
Troponin I (High Sensitivity): <2 ng/L (ref <18)

## 2021-01-31 MED ORDER — ALUM & MAG HYDROXIDE-SIMETH 200-200-20 MG/5ML PO SUSP
30.0000 mL | Freq: Once | ORAL | Status: AC
  Administered 2021-01-31: 30 mL via ORAL
  Filled 2021-01-31: qty 30

## 2021-01-31 NOTE — Patient Instructions (Addendum)
  Health Maintenance, Female Adopting a healthy lifestyle and getting preventive care are important in promoting health and wellness. Ask your health care provider about:  The right schedule for you to have regular tests and exams.  Things you can do on your own to prevent diseases and keep yourself healthy. What should I know about diet, weight, and exercise? Eat a healthy diet  Eat a diet that includes plenty of vegetables, fruits, low-fat dairy products, and lean protein.  Do not eat a lot of foods that are high in solid fats, added sugars, or sodium.   Maintain a healthy weight Body mass index (BMI) is used to identify weight problems. It estimates body fat based on height and weight. Your health care provider can help determine your BMI and help you achieve or maintain a healthy weight. Get regular exercise Get regular exercise. This is one of the most important things you can do for your health. Most adults should:  Exercise for at least 150 minutes each week. The exercise should increase your heart rate and make you sweat (moderate-intensity exercise).  Do strengthening exercises at least twice a week. This is in addition to the moderate-intensity exercise.  Spend less time sitting. Even light physical activity can be beneficial. Watch cholesterol and blood lipids Have your blood tested for lipids and cholesterol at 40 years of age, then have this test every 5 years. Have your cholesterol levels checked more often if:  Your lipid or cholesterol levels are high.  You are older than 40 years of age.  You are at high risk for heart disease. What should I know about cancer screening? Depending on your health history and family history, you may need to have cancer screening at various ages. This may include screening for:  Breast cancer.  Cervical cancer.  Colorectal cancer.  Skin cancer.  Lung cancer. What should I know about heart disease, diabetes, and high blood  pressure? Blood pressure and heart disease  High blood pressure causes heart disease and increases the risk of stroke. This is more likely to develop in people who have high blood pressure readings, are of African descent, or are overweight.  Have your blood pressure checked: ? Every 3-5 years if you are 18-39 years of age. ? Every year if you are 40 years old or older. Diabetes Have regular diabetes screenings. This checks your fasting blood sugar level. Have the screening done:  Once every three years after age 40 if you are at a normal weight and have a low risk for diabetes.  More often and at a younger age if you are overweight or have a high risk for diabetes. What should I know about preventing infection? Hepatitis B If you have a higher risk for hepatitis B, you should be screened for this virus. Talk with your health care provider to find out if you are at risk for hepatitis B infection. Hepatitis C Testing is recommended for:  Everyone born from 1945 through 1965.  Anyone with known risk factors for hepatitis C. Sexually transmitted infections (STIs)  Get screened for STIs, including gonorrhea and chlamydia, if: ? You are sexually active and are younger than 40 years of age. ? You are older than 40 years of age and your health care provider tells you that you are at risk for this type of infection. ? Your sexual activity has changed since you were last screened, and you are at increased risk for chlamydia or gonorrhea. Ask your health   care provider if you are at risk.  Ask your health care provider about whether you are at high risk for HIV. Your health care provider may recommend a prescription medicine to help prevent HIV infection. If you choose to take medicine to prevent HIV, you should first get tested for HIV. You should then be tested every 3 months for as long as you are taking the medicine. Pregnancy  If you are about to stop having your period (premenopausal) and  you may become pregnant, seek counseling before you get pregnant.  Take 400 to 800 micrograms (mcg) of folic acid every day if you become pregnant.  Ask for birth control (contraception) if you want to prevent pregnancy. Osteoporosis and menopause Osteoporosis is a disease in which the bones lose minerals and strength with aging. This can result in bone fractures. If you are 65 years old or older, or if you are at risk for osteoporosis and fractures, ask your health care provider if you should:  Be screened for bone loss.  Take a calcium or vitamin D supplement to lower your risk of fractures.  Be given hormone replacement therapy (HRT) to treat symptoms of menopause. Follow these instructions at home: Lifestyle  Do not use any products that contain nicotine or tobacco, such as cigarettes, e-cigarettes, and chewing tobacco. If you need help quitting, ask your health care provider.  Do not use street drugs.  Do not share needles.  Ask your health care provider for help if you need support or information about quitting drugs. Alcohol use  Do not drink alcohol if: ? Your health care provider tells you not to drink. ? You are pregnant, may be pregnant, or are planning to become pregnant.  If you drink alcohol: ? Limit how much you use to 0-1 drink a day. ? Limit intake if you are breastfeeding.  Be aware of how much alcohol is in your drink. In the [place] one drink equals one 12 oz bottle of beer (355 mL), one 5 oz glass of wine (148 mL), or one 1 oz glass of hard liquor (44 mL). General instructions  Schedule regular health, dental, and eye exams.  Stay current with your vaccines.  Tell your health care provider if: ? You often feel depressed. ? You have ever been abused or do not feel safe at home. Summary  Adopting a healthy lifestyle and getting preventive care are important in promoting health and wellness.  Follow your health care provider's instructions about healthy  diet, exercising, and getting tested or screened for diseases.  Follow your health care provider's instructions on monitoring your cholesterol and blood pressure. This information is not intended to replace advice given to you by your health care provider. Make sure you discuss any questions you have with your health care provider. Document Revised: 10/23/2018 Document Reviewed: 10/23/2018 Elsevier Patient Education  2021 Elsevier Inc.   If you have lab work done today you will be contacted with your lab results within the next 2 weeks.  If you have not heard from us then please contact us. The fastest way to get your results is to register for My Chart.   IF you received an x-ray today, you will receive an invoice from [facility]. Please contact [facility] at [phone] with questions or concerns regarding your invoice.   IF you received labwork today, you will receive an invoice from [facility]. Please contact [facility] at [phone] with questions or concerns regarding your invoice.   Our billing   staff will not be able to assist you with questions regarding bills from these companies.  You will be contacted with the lab results as soon as they are available. The fastest way to get your results is to activate your My Chart account. Instructions are located on the last page of this paperwork. If you have not heard from us regarding the results in 2 weeks, please contact this office.      

## 2021-01-31 NOTE — ED Notes (Signed)
Pt drinking fluids with no complications.

## 2021-01-31 NOTE — Discharge Instructions (Addendum)
Your testing is reassuring.  There is no evidence of a heart attack.  Your risk of blood clot in the lung is considered low despite your pregnancy. You declined CT scan today. You are advised to follow-up with OB as well as the pulmonologist.  You may take Maalox or Mylanta for acid reflux that is safe during pregnancy. Return to the ED with difficulty breathing, exertional chest pain, nausea, vomiting, other any concerns

## 2021-01-31 NOTE — BH Specialist Note (Signed)
Called pt, at pt request, after pt is very emotional at [employer] today. Pt is feeling overwhelmed, anxious, panicking at the thought of having another baby. As pt is talking, pt's husband is in the background screaming that he will divorce her if she kills his baby. When pt is asked by [facility] if she feels safe,  Husband gets on the phone, screaming and cursing that pt heard the heartbeat, that nobody is trying to hurt her(pt). Pt's husband gets off the phone and pt returns, saying that he left the house. Pt says she doesn't know what to do, that she can't have another baby, that she keeps feeling like she can't breathe, thinks maybe it could be from Covid, and that since she's pregnant, she can't continue receiving treatments.   Pt is panicking on the phone, and agrees to do some progressive muscle relaxation over the phone to help manage emotions in the moment and bring herself to a calmer state. Pt requests to know her options. Pt agrees that she will receive a call-back tomorrow regarding pregnancy and to follow up with [facility] in two weeks regarding anxiety.   Valetta Close Shanyah Gattuso, LCSW

## 2021-01-31 NOTE — Patient Instructions (Signed)
[facility] [address] , [place] [address] [phone] (main office) [phone] (Janda Cargo's office)  /Emotional Wellbeing Apps and Websites Here are a few free apps meant to help you to help yourself.  To find, try searching on the internet to see if the app is offered on Apple/Android devices. If your first choice doesn't come up on your device, the good news is that there are many choices! Play around with different apps to see which ones are helpful to you.    Calm This is an app meant to help increase calm feelings. Includes info, strategies, and tools for tracking your feelings.      Calm Harm  This app is meant to help with self-harm. Provides many 5-minute or 15-min coping strategies for doing instead of hurting yourself.       Healthy Minds Health Minds is a problem-solving tool to help deal with emotions and cope with stress you encounter wherever you are.      MindShift This app can help people cope with anxiety. Rather than trying to avoid anxiety, you can make an important shift and face it.      MY3  MY3 features a support system, safety plan and resources with the goal of offering a tool to use in a time of need.       My Life My Voice  This mood journal offers a simple solution for tracking your thoughts, feelings and moods. Animated emoticons can help identify your mood.       Relax Melodies Designed to help with sleep, on this app you can mix sounds and meditations for relaxation.      Smiling Mind Smiling Mind is meditation made easy: it's a simple tool that helps put a smile on your mind.        Stop, Breathe & Think  A friendly, simple guide for people through meditations for mindfulness and compassion.  Stop, Breathe and Think Kids Enter your current feelings and choose a "mission" to help you cope. Offers videos for certain moods instead of just sound recordings.       Team  Orange The goal of this tool is to help teens change how they think, act, and react. This app helps you focus on your own good feelings and experiences.      The Virtual Hope Box The Virtual Hope Box (VHB) contains simple tools to help patients with coping, relaxation, distraction, and positive thinking.     

## 2021-01-31 NOTE — Progress Notes (Signed)
3/21/20222:59 PM  Belinda Mcintosh [birth year], 40 y.o., female [id number]  Chief Complaint  Patient presents with  . Hospitalization Follow-up    Pt was seen ED earl this morning, pt started coughing and very productive, could not breath through sinuses and thick phlem kept her from breathing well. Had Xray and blood and no pneumonia detected needs to follow up here     HPI:   Patient is a 40 y.o. female with past medical history significant for COVID who presents today for ED followup.  She is [redacted] weeks pregnant She doesn't want the baby Her husband does want it Had COVID early February After that continues to have congestion Started feeling better Then her daughter got sick again Was given antibiotics due to potential pneumonia Still gets occasional SOB and congestion Has been being treated at post-covid clinic Was seen in the ED 3/14, 3/19, and 3/21 Ruled out PE while in the ED Continues to have issues with heartburn and congestion Labs and CXR done in ED today    Depression screen [facility] 2/9 01/19/2021 12/30/2020 11/18/2020  Decreased Interest 0 0 0  Down, Depressed, Hopeless 0 0 0  PHQ - 2 Score 0 0 0  Altered sleeping - - -  Tired, decreased energy - - -  Change in appetite - - -  Feeling bad or failure about yourself  - - -  Trouble concentrating - - -  Moving slowly or fidgety/restless - - -  Suicidal thoughts - - -  PHQ-9 Score - - -    Fall Risk  01/19/2021 12/30/2020 11/18/2020 09/17/2020 04/30/2020  Falls in the past year? 0 0 0 0 0  Number falls in past yr: 0 0 0 0 -  Injury with Fall? 0 0 0 0 -  Risk for fall due to : - - No Fall Risks - -  Follow up Falls evaluation completed Falls evaluation completed Falls evaluation completed Falls evaluation completed Falls evaluation completed     Allergies  Allergen Reactions  . Shrimp [Shellfish Allergy] Itching    Prior to Admission medications   Medication Sig Start Date End Date Taking? Authorizing Provider   acetaminophen (TYLENOL) 500 MG tablet Take 500 mg by mouth every 6 (six) hours as needed for headache.    [provider]  albuterol (VENTOLIN HFA) 108 (90 Base) MCG/ACT inhaler Inhale 2 puffs into the lungs every 6 (six) hours as needed for wheezing or shortness of breath. Patient not taking: No sig reported 01/19/21   [name], NP  azelastine (ASTELIN) 0.1 % nasal spray Place 1 spray into both nostrils 2 (two) times daily. Use in each nostril as directed Patient taking differently: Place 1 spray into both nostrils 2 (two) times daily as needed for rhinitis or allergies. Use in each nostril as directed 01/19/21   [name], NP  azithromycin (ZITHROMAX) 250 MG tablet Take 2 tabs on first day. Then take 1 tab daily. Finish entire supply. Patient not taking: No sig reported 01/23/21   [name], NP  Prenatal Vit-Fe Fumarate-FA (PRENATAL MULTIVITAMIN) TABS tablet Take 1 tablet by mouth daily at 12 noon.    [provider]  sucralfate (CARAFATE) 1 g tablet Take 1 tablet (1 g total) by mouth 2 (two) times daily as needed. 01/29/21   Melene Plan, DO  Vibegron (GEMTESA) 75 MG TABS Take 75 mg by mouth daily as needed (pain/cystitis).    [provider]    Past Medical History:  Diagnosis  Date  . Abdominal pain   . Flank pain   . History of kidney stones   . Low back pain   . Palpitations   . Umbilical hernia 2018    Past Surgical History:  Procedure Laterality Date  . COSMETIC SURGERY    . INDUCED ABORTION    . LIPOSUCTION    . NOSE SURGERY      Social History   Tobacco Use  . Smoking status: Former Smoker    Packs/day: 1.00    Types: Cigarettes    Quit date: 09/21/2016    Years since quitting: 4.3  . Smokeless tobacco: Never Used  Substance Use Topics  . Alcohol use: No    Family History  Problem Relation Age of Onset  . Hypertension Mother   . Diabetes Mother   . Breast cancer Mother        Breast Cancer  . Cancer Father   .  Hypertension Father   . Diabetes Father   . Cancer Sister   . Fibromyalgia Sister   . Other Sister        spinal stenosis  . Alcohol abuse Neg Hx   . Arthritis Neg Hx   . Asthma Neg Hx   . COPD Neg Hx   . Depression Neg Hx   . Drug abuse Neg Hx   . Early death Neg Hx   . Heart disease Neg Hx   . Hearing loss Neg Hx   . Hyperlipidemia Neg Hx   . Kidney disease Neg Hx   . Learning disabilities Neg Hx   . Mental illness Neg Hx   . Mental retardation Neg Hx   . Miscarriages / Stillbirths Neg Hx   . Stroke Neg Hx   . Vision loss Neg Hx   . Varicose Veins Neg Hx     Review of Systems  Constitutional: Negative for chills, fever and malaise/fatigue.  HENT: Positive for congestion. Negative for ear discharge, ear pain, hearing loss, sinus pain and sore throat.   Respiratory: Positive for cough, sputum production and shortness of breath.   Cardiovascular: Negative for chest pain and palpitations.  Gastrointestinal: Positive for heartburn. Negative for abdominal pain, constipation, diarrhea, nausea and vomiting.  Genitourinary: Negative for dysuria.  Musculoskeletal: Negative for back pain and myalgias.     OBJECTIVE:  Today's Vitals   01/31/21 1429  BP: 104/64  Pulse: 83  Resp: 16  Temp: 98.4 F (36.9 C)  TempSrc: Temporal  SpO2: 97%  Weight: 144 lb 3.2 oz (65.4 kg)  Height: 5\' 1"  (1.549 m)   Body mass index is 27.25 kg/m.   Physical Exam  Results for orders placed or performed during the hospital encounter of 01/31/21 (from the past 24 hour(s))  Urinalysis, Routine w reflex microscopic Urine, Clean Catch     Status: None   Collection Time: 01/31/21  2:45 AM  Result Value Ref Range   Color, Urine YELLOW YELLOW   APPearance CLEAR CLEAR   Specific Gravity, Urine 1.009 1.005 - 1.030   pH 6.0 5.0 - 8.0   Glucose, UA NEGATIVE NEGATIVE mg/dL   Hgb urine dipstick NEGATIVE NEGATIVE   Bilirubin Urine NEGATIVE NEGATIVE   Ketones, ur NEGATIVE NEGATIVE mg/dL   Protein,  ur NEGATIVE NEGATIVE mg/dL   Nitrite NEGATIVE NEGATIVE   Leukocytes,Ua NEGATIVE NEGATIVE  CBC with Differential/Platelet     Status: Abnormal   Collection Time: 01/31/21  2:58 AM  Result Value Ref Range   WBC 8.1 4.0 -  10.5 K/uL   RBC 4.03 3.87 - 5.11 MIL/uL   Hemoglobin 12.3 12.0 - 15.0 g/dL   HCT 06.2 69.4 - 85.4 %   MCV 91.3 80.0 - 100.0 fL   MCH 30.5 26.0 - 34.0 pg   MCHC 33.4 30.0 - 36.0 g/dL   RDW 62.7 03.5 - 00.9 %   Platelets 139 (L) 150 - 400 K/uL   nRBC 0.0 0.0 - 0.2 %   Neutrophils Relative % 72 %   Neutro Abs 5.8 1.7 - 7.7 K/uL   Lymphocytes Relative 21 %   Lymphs Abs 1.7 0.7 - 4.0 K/uL   Monocytes Relative 5 %   Monocytes Absolute 0.4 0.1 - 1.0 K/uL   Eosinophils Relative 2 %   Eosinophils Absolute 0.1 0.0 - 0.5 K/uL   Basophils Relative 0 %   Basophils Absolute 0.0 0.0 - 0.1 K/uL   Immature Granulocytes 0 %   Abs Immature Granulocytes 0.03 0.00 - 0.07 K/uL  Comprehensive metabolic panel     Status: Abnormal   Collection Time: 01/31/21  2:58 AM  Result Value Ref Range   Sodium 135 135 - 145 mmol/L   Potassium 3.8 3.5 - 5.1 mmol/L   Chloride 107 98 - 111 mmol/L   CO2 21 (L) 22 - 32 mmol/L   Glucose, Bld 115 (H) 70 - 99 mg/dL   BUN 11 6 - 20 mg/dL   Creatinine, Ser 3.81 0.44 - 1.00 mg/dL   Calcium 9.1 8.9 - 82.9 mg/dL   Total Protein 7.1 6.5 - 8.1 g/dL   Albumin 3.7 3.5 - 5.0 g/dL   AST 14 (L) 15 - 41 U/L   ALT 17 0 - 44 U/L   Alkaline Phosphatase 47 38 - 126 U/L   Total Bilirubin 0.6 0.3 - 1.2 mg/dL   GFR, Estimated >93 >71 mL/min   Anion gap 7 5 - 15  Lipase, blood     Status: None   Collection Time: 01/31/21  2:58 AM  Result Value Ref Range   Lipase 27 11 - 51 U/L  Troponin I (High Sensitivity)     Status: None   Collection Time: 01/31/21  2:58 AM  Result Value Ref Range   Troponin I (High Sensitivity) <2 <18 ng/L  D-dimer, quantitative     Status: None   Collection Time: 01/31/21  2:58 AM  Result Value Ref Range   D-Dimer, Quant 0.47 0.00 -  0.50 ug/mL-FEU  Troponin I (High Sensitivity)     Status: None   Collection Time: 01/31/21  5:04 AM  Result Value Ref Range   Troponin I (High Sensitivity) <2 <18 ng/L    DG Chest 2 View  Result Date: 01/31/2021 CLINICAL DATA:  Shortness of breath since COVID diagnosis 12/27/2020 EXAM: CHEST - 2 VIEW COMPARISON:  01/29/2021, 01/24/2021, 12/01/2016 FINDINGS: No consolidation, features of edema, pneumothorax, or effusion. Pulmonary vascularity is normally distributed. The cardiomediastinal contours are unremarkable. No acute osseous or soft tissue abnormality. IMPRESSION: No acute cardiopulmonary abnormality. Electronically Signed   By: Kreg Shropshire M.D.   On: 01/31/2021 03:11   US OB Comp Less 14 Wks  Result Date: 01/31/2021 CLINICAL DATA:  Abdominal pain EXAM: OBSTETRIC <14 WK ULTRASOUND TECHNIQUE: Transabdominal ultrasound was performed for evaluation of the gestation as well as the maternal uterus and adnexal regions. COMPARISON:  Ultrasound 05/17/2017 FINDINGS: Intrauterine gestational sac: Single Yolk sac:  Not Visualized. Embryo:  Visualized. Cardiac Activity: Visualized. Heart Rate: 173 bpm CRL: 49.4 mm   11  w 5 d                  [place] EDC: 08/17/2021 Subchorionic hemorrhage: Small amount of hypoechoic subchorionic hemorrhage. Maternal uterus/adnexae: Anteverted maternal uterus. No concerning adnexal lesions. No pelvic free fluid. IMPRESSION: Single intrauterine gestation at 11 weeks, 5 days by crown-rump length sonographic estimation. Small volume subchorionic hemorrhage. No other acute sonographic abnormalities. Nonvisualization of the yolk sac is a physiologic finding beyond 10 weeks. Electronically Signed   By: Kreg Shropshire M.D.   On: 01/31/2021 04:36   US Abdomen Limited RUQ (LIVER/GB)  Result Date: 01/31/2021 CLINICAL DATA:  Right upper quadrant pain for 2 days, COVID positive February 2022 EXAM: ULTRASOUND ABDOMEN LIMITED RIGHT UPPER QUADRANT COMPARISON:  CT 04/06/2020 FINDINGS:  Gallbladder: No gallstones or wall thickening visualized. No sonographic Murphy sign noted by sonographer. Common bile duct: Diameter: 6.6 mm, borderline dilated, no visible intraductal gallstones though the biliary tree is incompletely visualized. Liver: Mildly increased hepatic echogenicity with some loss of portal triad definition could reflect mild hepatic steatosis. No concerning focal liver lesion no intrahepatic biliary ductal dilatation. Visible liver surface contour is smooth. Portal vein is patent on color Doppler imaging with normal direction of blood flow towards the liver. Other: None. IMPRESSION: 1. Mild hepatic steatosis. 2. Borderline dilated common bile duct. No visible intraductal stones or intrahepatic biliary ductal dilatation. Correlate with liver serologies and consider MRCP if there is clinical concern for biliary obstruction/choledocholithiasis. 3. Otherwise unremarkable right upper quadrant ultrasound. Electronically Signed   By: Kreg Shropshire M.D.   On: 01/31/2021 04:39     ASSESSMENT and PLAN  Problem List Items Addressed This Visit      Other   History of COVID-19 - Primary    Other Visit Diagnoses    Gastroesophageal reflux disease without esophagitis       Sinus congestion           Plan . Continue OTC conservative treatments for GERD and congestion . Encouraged to follow up with OB ASAP . Education provided on making pregnancy termination decisions. . RTC/ED precautions provided   Return if symptoms worsen or fail to improve, for follow up with OB.    Macario Carls Danetra Glock, FNP-BC Primary Care at [facility] [address] [place], [place] [address] Ph.  [phone] Fax [phone]

## 2021-01-31 NOTE — ED Provider Notes (Signed)
[facility]-EMERGENCY DEPT Provider Note   CSN: [id number] Arrival date & time: 01/31/21  0145     History Chief Complaint  Patient presents with  . Shortness of Breath    Belinda Mcintosh is a 40 y.o. female.  Patient very anxious.  She awoke from sleep with sensation of "phlegm" stuck in her chest and gasping for breath.  States she felt well when she went to sleep.  She feels like there is a burning sensation that feels stuck in her chest that she cannot take of breath was gasping for air at home.  States she vomited several times after a coughing fit.  She was not able to cough up anything.  Patient was seen in the ED 2 days ago for chest pain that was different than this and now she states this is a sensation of something caught in her chest.  She is been having issues with shortness of breath since having Covid about a month ago.  She is currently [redacted] weeks pregnant but has not yet had an ultrasound to confirm this pregnancy.  She denies any abdominal pain or vaginal bleeding. She had a clear chest x-ray as well as a negative D-dimer.  She was prescribed sucralfate for was thought to be acid reflux.  The history is provided by the patient.  Shortness of Breath Associated symptoms: abdominal pain and cough   Associated symptoms: no chest pain, no fever, no headaches and no rash        Past Medical History:  Diagnosis Date  . Abdominal pain   . Flank pain   . History of kidney stones   . Low back pain   . Palpitations   . Umbilical hernia 2018    Patient Active Problem List   Diagnosis Date Noted  . History of COVID-19 01/24/2021  . PVC (premature ventricular contraction) 07/16/2020  . Genital warts complicating pregnancy, third trimester 04/30/2017  . Umbilical hernia 02/07/2017    Past Surgical History:  Procedure Laterality Date  . COSMETIC SURGERY    . INDUCED ABORTION    . LIPOSUCTION    . NOSE SURGERY       OB History    Gravida  3    Para  1   Term  1   Preterm  0   AB  1   Living  1     SAB  0   IAB  1   Ectopic  0   Multiple  0   Live Births  1           Family History  Problem Relation Age of Onset  . Hypertension Mother   . Diabetes Mother   . Breast cancer Mother        Breast Cancer  . Cancer Father   . Hypertension Father   . Diabetes Father   . Cancer Sister   . Fibromyalgia Sister   . Other Sister        spinal stenosis  . Alcohol abuse Neg Hx   . Arthritis Neg Hx   . Asthma Neg Hx   . COPD Neg Hx   . Depression Neg Hx   . Drug abuse Neg Hx   . Early death Neg Hx   . Heart disease Neg Hx   . Hearing loss Neg Hx   . Hyperlipidemia Neg Hx   . Kidney disease Neg Hx   . Learning disabilities Neg Hx   . Mental illness  Neg Hx   . Mental retardation Neg Hx   . Miscarriages / Stillbirths Neg Hx   . Stroke Neg Hx   . Vision loss Neg Hx   . Varicose Veins Neg Hx     Social History   Tobacco Use  . Smoking status: Former Smoker    Packs/day: 1.00    Types: Cigarettes    Quit date: 09/21/2016    Years since quitting: 4.3  . Smokeless tobacco: Never Used  Vaping Use  . Vaping Use: Never used  Substance Use Topics  . Alcohol use: No  . Drug use: No    Home Medications Prior to Admission medications   Medication Sig Start Date End Date Taking? Authorizing Provider  acetaminophen (TYLENOL) 500 MG tablet Take 500 mg by mouth every 6 (six) hours as needed for headache.    [provider]  albuterol (VENTOLIN HFA) 108 (90 Base) MCG/ACT inhaler Inhale 2 puffs into the lungs every 6 (six) hours as needed for wheezing or shortness of breath. Patient not taking: No sig reported 01/19/21   Janeece AgeeMorrow, Richard, NP  azelastine (ASTELIN) 0.1 % nasal spray Place 1 spray into both nostrils 2 (two) times daily. Use in each nostril as directed Patient taking differently: Place 1 spray into both nostrils 2 (two) times daily as needed for rhinitis or allergies. Use in each nostril as  directed 01/19/21   Janeece AgeeMorrow, Richard, NP  azithromycin (ZITHROMAX) 250 MG tablet Take 2 tabs on first day. Then take 1 tab daily. Finish entire supply. Patient not taking: No sig reported 01/23/21   Janeece AgeeMorrow, Richard, NP  Prenatal Vit-Fe Fumarate-FA (PRENATAL MULTIVITAMIN) TABS tablet Take 1 tablet by mouth daily at 12 noon.    [provider]  sucralfate (CARAFATE) 1 g tablet Take 1 tablet (1 g total) by mouth 2 (two) times daily as needed. 01/29/21   Melene PlanFloyd, Dan, DO  Vibegron (GEMTESA) 75 MG TABS Take 75 mg by mouth daily as needed (pain/cystitis).    [provider]    Allergies    Shrimp [shellfish allergy]  Review of Systems   Review of Systems  Constitutional: Negative for activity change, appetite change and fever.  HENT: Negative for congestion and rhinorrhea.   Respiratory: Positive for cough, chest tightness and shortness of breath.   Cardiovascular: Negative for chest pain.  Gastrointestinal: Positive for abdominal pain.  Genitourinary: Negative for dysuria and hematuria.  Musculoskeletal: Negative for arthralgias and myalgias.  Skin: Negative for rash.  Neurological: Negative for dizziness, weakness, light-headedness and headaches.   all other systems are negative except as noted in the HPI and PMH.   Physical Exam Updated Vital Signs BP 113/78   Pulse (!) 115   Temp (!) 97.4 F (36.3 C) (Oral) Comment: Pt would not keep mouth closed around temp probe  Resp (!) 22   Ht 5\' 1"  (1.549 m)   Wt 65.3 kg   SpO2 98%   BMI 27.21 kg/m   Physical Exam Vitals and nursing note reviewed.  Constitutional:      General: She is not in acute distress.    Appearance: She is well-developed. She is not ill-appearing.     Comments: Anxious, tearful.  HENT:     Head: Normocephalic and atraumatic.     Mouth/Throat:     Pharynx: No oropharyngeal exudate.  Eyes:     Conjunctiva/sclera: Conjunctivae normal.     Pupils: Pupils are equal, round, and reactive to light.   Neck:  Comments: No meningismus. Cardiovascular:     Rate and Rhythm: Regular rhythm. Tachycardia present.     Heart sounds: Normal heart sounds. No murmur heard.     Comments: Lungs are clear bilaterally.  Tachycardic to the 110s.  She is anxious and tearful Pulmonary:     Effort: Pulmonary effort is normal. No respiratory distress.     Breath sounds: Normal breath sounds.  Abdominal:     Palpations: Abdomen is soft.     Tenderness: There is abdominal tenderness. There is no guarding or rebound.     Comments: Epigastric tenderness.  Musculoskeletal:        General: No tenderness. Normal range of motion.     Cervical back: Normal range of motion and neck supple.  Skin:    General: Skin is warm.  Neurological:     Mental Status: She is alert and oriented to person, place, and time.     Cranial Nerves: No cranial nerve deficit.     Motor: No abnormal muscle tone.     Coordination: Coordination normal.     Comments:  5/5 strength throughout. CN 2-12 intact.Equal grip strength.   Psychiatric:        Behavior: Behavior normal.     ED Results / Procedures / Treatments   Labs (all labs ordered are listed, but only abnormal results are displayed) Labs Reviewed  CBC WITH DIFFERENTIAL/PLATELET - Abnormal; Notable for the following components:      Result Value   Platelets 139 (*)    All other components within normal limits  COMPREHENSIVE METABOLIC PANEL - Abnormal; Notable for the following components:   CO2 21 (*)    Glucose, Bld 115 (*)    AST 14 (*)    All other components within normal limits  LIPASE, BLOOD  URINALYSIS, ROUTINE W REFLEX MICROSCOPIC  D-DIMER, QUANTITATIVE  TROPONIN I (HIGH SENSITIVITY)  TROPONIN I (HIGH SENSITIVITY)    EKG EKG Interpretation  Date/Time:  Monday January 31 2021 02:35:06 EDT Ventricular Rate:  96 PR Interval:    QRS Duration: 73 QT Interval:  339 QTC Calculation: 429 R Axis:   25 Text Interpretation: Sinus rhythm Anteroseptal  infarct, age indeterminate nonspecific T wave inversions  similar to mArch 14 Confirmed by Glynn Octave [phone]) on 01/31/2021 2:46:29 AM   Radiology DG Chest 2 View  Result Date: 01/31/2021 CLINICAL DATA:  Shortness of breath since COVID diagnosis 12/27/2020 EXAM: CHEST - 2 VIEW COMPARISON:  01/29/2021, 01/24/2021, 12/01/2016 FINDINGS: No consolidation, features of edema, pneumothorax, or effusion. Pulmonary vascularity is normally distributed. The cardiomediastinal contours are unremarkable. No acute osseous or soft tissue abnormality. IMPRESSION: No acute cardiopulmonary abnormality. Electronically Signed   By: Kreg Shropshire M.D.   On: 01/31/2021 03:11   DG Chest 2 View  Result Date: 01/29/2021 CLINICAL DATA:  Chest pain EXAM: CHEST - 2 VIEW COMPARISON:  01/24/2021 FINDINGS: The heart size and mediastinal contours are within normal limits. Both lungs are clear. The visualized skeletal structures are unremarkable. IMPRESSION: No active cardiopulmonary disease. Electronically Signed   By: Gaylyn Rong M.D.   On: 01/29/2021 14:52   US OB Comp Less 14 Wks  Result Date: 01/31/2021 CLINICAL DATA:  Abdominal pain EXAM: OBSTETRIC <14 WK ULTRASOUND TECHNIQUE: Transabdominal ultrasound was performed for evaluation of the gestation as well as the maternal uterus and adnexal regions. COMPARISON:  Ultrasound 05/17/2017 FINDINGS: Intrauterine gestational sac: Single Yolk sac:  Not Visualized. Embryo:  Visualized. Cardiac Activity: Visualized. Heart Rate: 173 bpm CRL: 49.4  mm   11 w 5 d                  [place] EDC: 08/17/2021 Subchorionic hemorrhage: Small amount of hypoechoic subchorionic hemorrhage. Maternal uterus/adnexae: Anteverted maternal uterus. No concerning adnexal lesions. No pelvic free fluid. IMPRESSION: Single intrauterine gestation at 11 weeks, 5 days by crown-rump length sonographic estimation. Small volume subchorionic hemorrhage. No other acute sonographic abnormalities. Nonvisualization of  the yolk sac is a physiologic finding beyond 10 weeks. Electronically Signed   By: Kreg Shropshire M.D.   On: 01/31/2021 04:36   US Abdomen Limited RUQ (LIVER/GB)  Result Date: 01/31/2021 CLINICAL DATA:  Right upper quadrant pain for 2 days, COVID positive February 2022 EXAM: ULTRASOUND ABDOMEN LIMITED RIGHT UPPER QUADRANT COMPARISON:  CT 04/06/2020 FINDINGS: Gallbladder: No gallstones or wall thickening visualized. No sonographic Murphy sign noted by sonographer. Common bile duct: Diameter: 6.6 mm, borderline dilated, no visible intraductal gallstones though the biliary tree is incompletely visualized. Liver: Mildly increased hepatic echogenicity with some loss of portal triad definition could reflect mild hepatic steatosis. No concerning focal liver lesion no intrahepatic biliary ductal dilatation. Visible liver surface contour is smooth. Portal vein is patent on color Doppler imaging with normal direction of blood flow towards the liver. Other: None. IMPRESSION: 1. Mild hepatic steatosis. 2. Borderline dilated common bile duct. No visible intraductal stones or intrahepatic biliary ductal dilatation. Correlate with liver serologies and consider MRCP if there is clinical concern for biliary obstruction/choledocholithiasis. 3. Otherwise unremarkable right upper quadrant ultrasound. Electronically Signed   By: Kreg Shropshire M.D.   On: 01/31/2021 04:39    Procedures Procedures   Medications Ordered in ED Medications  alum & mag hydroxide-simeth (MAALOX/MYLANTA) 200-200-20 MG/5ML suspension 30 mL (has no administration in time range)    ED Course  I have reviewed the triage vital signs and the nursing notes.  Pertinent labs & imaging results that were available during my care of the patient were reviewed by me and considered in my medical decision making (see chart for details).    MDM Rules/Calculators/A&P                         Patient currently [redacted] weeks pregnant awoke from sleep with "phlegm"  stuck in her chest with difficulty breathing, coughing fit and vomiting.  She is very anxious but has clear lungs and is in no distress and not hypoxic.  EKG with unchanged T wave inversions.  FHT 145s. Labs reassuring. Stable hemoglobin.  Troponin negative.  D-dimer negative. CXR negative.   Low suspicion for ACS, PE, aortic dissection.   Epigastric pain has resolved after Maalox.  LFTs and lipase are normal.  Right upper quadrant ultrasound shows normal-appearing gallbladder with mildly dilated common bile duct.  Liver function is normal.  Patient very anxious about her situation and ongoing shortness of breath after covid and is worried about "lung inflammation" and the face that she can't take NSAIDs or steroids due to pregnancy.  She is reassured that this is likely still due to her lungs recovering from Covid.  She was told by the covid clinic that she might need steroids to help with "lung inflammation" but they would not want to prescribe them due to her pregnancy.  Patient is asking whether she should have an abortion so she can get these steroids. Advised that is her decision but her work-up is reassuring without hypoxia or increased work of breathing and no wheezing and  do not feel strongly that she needs steroids at this point. Would not recommend in setting of pregnancy.  Tachycardia has resolved.  Patient is less anxious.  Her abdominal pain has resolved after Maalox.  She is saturating 99% on room air.  With negative D-dimer there is low suspicion for pulmonary embolism. YEARS algorithm essentially excludes PE.  Risks and benefits of CT angiogram discussed with patient and she agrees to defer at this time.  Advised to follow-up with her pulmonologist as well as OB. No indication for admission to the hospital today.  Return precautions discussed.    Final Clinical Impression(s) / ED Diagnoses Final diagnoses:  RUQ pain  Atypical chest pain  Cough    Rx / DC Orders ED  Discharge Orders    None       Graycie Halley, Jeannett Senior, MD 01/31/21 [phone]

## 2021-01-31 NOTE — ED Triage Notes (Addendum)
Pt c/o increased shortness of breath since dx of covid on 2/14. Pt sts while asleep she woke up gasping for air. Constant cough and need to clear throat. Also sts recent negative PE exam and seen for chest discomfort/ shob. [redacted] weeks pregnant.

## 2021-01-31 NOTE — ED Notes (Signed)
Fetal heart beat 145.

## 2021-02-01 ENCOUNTER — Ambulatory Visit (INDEPENDENT_AMBULATORY_CARE_PROVIDER_SITE_OTHER): Payer: 59 | Admitting: Nurse Practitioner

## 2021-02-01 VITALS — BP 109/95 | HR 106 | Temp 97.9°F

## 2021-02-01 DIAGNOSIS — F419 Anxiety disorder, unspecified: Secondary | ICD-10-CM

## 2021-02-01 DIAGNOSIS — R0602 Shortness of breath: Secondary | ICD-10-CM

## 2021-02-01 DIAGNOSIS — Z8616 Personal history of COVID-19: Secondary | ICD-10-CM

## 2021-02-01 DIAGNOSIS — K2100 Gastro-esophageal reflux disease with esophagitis, without bleeding: Secondary | ICD-10-CM

## 2021-02-01 HISTORY — DX: Shortness of breath: R06.02

## 2021-02-01 NOTE — Assessment & Plan Note (Signed)
Cough Nasal congestion:   Stay well hydrated  Stay active  Deep breathing exercises  May take tylenol for fever or pain  May try warm tea in moderation  May try tums for reflux  May try zyrtec for sinus congestion  Please keep appointment with OB/GYN to discuss     Follow up:  Follow up in 2 weeks or sooner if needed

## 2021-02-01 NOTE — BH Specialist Note (Signed)
Integrated Behavioral Health Initial In-Person Visit  MRN: [id number] Name: Belinda Mcintosh  Number of Integrated Behavioral Health Clinician visits:: 1/6 Session Start time: 2:29  Session End time: 3:14 Total time: 45  minutes  Types of Service: Individual psychotherapy and Video visit  Interpretor:No. Interpretor Name and Language: n/a   Warm Hand Off Completed.       Subjective: Belinda Mcintosh is a 40 y.o. female accompanied by n/a Patient was referred by Merian Capron, MD for anxiety. Patient reports the following symptoms/concerns: Pt states her primary concerns today are feeling a continued increase in depression and anxiety with positive pregnancy, attributed to uncertainties in pregnancy with long COVID, along with concern about nausea/vomiting/heartburn and possible vaginal infection; worries about waking up with tingling in her hands; denies panic attacks in the past week. Pt felt best doing hour-long workouts daily, but feels too sick to resume.  Duration of problem: Current pregnancy; Severity of problem: moderate  Objective: Mood: Anxious and Affect: Appropriate Risk of harm to self or others: No plan to harm self or others  Life Context: Family and Social: Pt lives with her husband and daughter School/Work: Pt's husband working Self-Care: Used to do 60 minute workout daily Life Changes: Current unplanned pregnancy  Patient and/or Family's Strengths/Protective Factors: open to treatment   Goals Addressed: Patient will: 1. Reduce symptoms of: anxiety, depression and stress 2. Increase knowledge and/or ability of: healthy habits  3. Demonstrate ability to: Increase healthy adjustment to current life circumstances  Progress towards Goals: Ongoing  Interventions: Interventions utilized: Mindfulness or Management consultant and Psychoeducation and/or Health Education  Standardized Assessments completed: GAD-7 and PHQ 9  Patient and/or Family Response: Pt  agrees to treatment plan  Patient Centered Plan: Patient is on the following Treatment Plan(s):  IBH  Assessment: Patient currently experiencing Adjustment disorder with mixed anxious and depressed mood.   Patient may benefit from psychoeducation and brief therapeutic interventions regarding coping with symptoms of anxiety, depression, stress .  Plan: 1. Follow up with behavioral health clinician on : One month; call Asher Muir at [phone] earlier as needed 2. Behavioral recommendations:  -Discuss resuming safe exercise, and medication for depression/anxiety with  medical provider on 02/21/2021 -Continue taking prenatal vitamin daily  -CALM relaxation breathing exercise twice daily (morning; at nighttime) -Read educational materials regarding coping with symptoms of anxiety with panic   3. Referral(s): [facility] (In Clinic)  [facility] [place], [place]  Depression screen [facility] 2/9 02/14/2021 01/19/2021 12/30/2020 11/18/2020 09/17/2020  Decreased Interest 2 0 0 0 0  Down, Depressed, Hopeless 2 0 0 0 0  PHQ - 2 Score 4 0 0 0 0  Altered sleeping 2 - - - -  Tired, decreased energy 2 - - - -  Change in appetite 2 - - - -  Feeling bad or failure about yourself  0 - - - -  Trouble concentrating 0 - - - -  Moving slowly or fidgety/restless 0 - - - -  Suicidal thoughts 0 - - - -  PHQ-9 Score 10 - - - -  Some recent data might be hidden   GAD 7 : Generalized Anxiety Score 02/14/2021 04/23/2017 04/16/2017 03/26/2017  Nervous, Anxious, on Edge 1 0 0 3  Control/stop worrying 0 0 0 2  Worry too much - different things 1 0 0 0  Trouble relaxing 1 0 0 0  Restless 0 0 0 0  Easily annoyed or irritable 1 0 0 0  Afraid -  awful might happen 1 0 0 0  Total GAD 7 Score 5 0 0 5

## 2021-02-01 NOTE — Progress Notes (Signed)
@Patient  ID: , female    DOB: Jul 16, 1981, 40 y.o.   MRN: [id number]  Chief Complaint  Patient presents with   Follow-up    Anxiety, shortness of breath, reflux, sinus congestion    Referring provider: [id number], NP  HPI   Patient presents today for post COVID care clinic visit follow-up.  Patient has been seen in the ED 3 times since her last visit here on 01/24/2021.  She was seen on 01/24/2021, 01/29/2021, yesterday.  Patient was confirmed to be around [redacted] weeks pregnant with ultrasound at her visit yesterday.  She did have a chest x-ray yesterday which was clear.  She was given Maalox yesterday.  She also had a follow-up visit yesterday with her primary care doctor.  She does have upcoming visit scheduled next week to establish care with her new OB/GYN.  Patient is having extreme anxiety and she has already been referred to behavioral health with the OB/GYN office.  She has been referred to pulmonary.  Patient continues to have extreme anxiety today.  We discussed that she will have to discuss anxiety medications with OB/GYN next week.  She may take Zyrtec and Tums to help relieve symptoms until seen by OB/GYN. Denies f/c/s, n/v/d, hemoptysis, PND, chest pain or edema.      Allergies  Allergen Reactions   Shrimp [Shellfish Allergy] Itching    Immunization History  Administered Date(s) Administered   Influenza,inj,Quad PF,6+ Mos 11/21/2016   PFIZER(Purple Top)SARS-COV-2 Vaccination 01/28/2020, 03/03/2020   Tdap 03/26/2017    Past Medical History:  Diagnosis Date   Abdominal pain    Flank pain    History of kidney stones    Low back pain    Palpitations    Umbilical hernia 2018    Tobacco History: Social History   Tobacco Use  Smoking Status Former Smoker   Packs/day: 1.00   Types: Cigarettes   Quit date: 09/21/2016   Years since quitting: 4.3  Smokeless Tobacco Never Used   Counseling given: Yes   Outpatient Encounter  Medications as of 02/01/2021  Medication Sig   acetaminophen (TYLENOL) 500 MG tablet Take 500 mg by mouth every 6 (six) hours as needed for headache.   albuterol (VENTOLIN HFA) 108 (90 Base) MCG/ACT inhaler Inhale 2 puffs into the lungs every 6 (six) hours as needed for wheezing or shortness of breath. (Patient not taking: No sig reported)   azelastine (ASTELIN) 0.1 % nasal spray Place 1 spray into both nostrils 2 (two) times daily. Use in each nostril as directed (Patient taking differently: Place 1 spray into both nostrils 2 (two) times daily as needed for rhinitis or allergies. Use in each nostril as directed)   azithromycin (ZITHROMAX) 250 MG tablet Take 2 tabs on first day. Then take 1 tab daily. Finish entire supply. (Patient not taking: No sig reported)   Prenatal Vit-Fe Fumarate-FA (PRENATAL MULTIVITAMIN) TABS tablet Take 1 tablet by mouth daily at 12 noon.   sucralfate (CARAFATE) 1 g tablet Take 1 tablet (1 g total) by mouth 2 (two) times daily as needed.   Vibegron (GEMTESA) 75 MG TABS Take 75 mg by mouth daily as needed (pain/cystitis).   No facility-administered encounter medications on file as of 02/01/2021.     Review of Systems  Review of Systems  Constitutional: Negative.  Negative for fatigue and fever.  HENT: Positive for congestion.   Respiratory: Positive for cough and shortness of breath.   Cardiovascular: Negative.   Gastrointestinal: Negative.  Allergic/Immunologic: Negative.   Neurological: Negative.   Psychiatric/Behavioral: The patient is nervous/anxious.        Physical Exam  BP (!) 109/95    Pulse (!) 106    Temp 97.9 F (36.6 C)    SpO2 100% Comment: RA  Wt Readings from Last 5 Encounters:  01/31/21 144 lb 3.2 oz (65.4 kg)  01/31/21 144 lb (65.3 kg)  01/24/21 144 lb (65.3 kg)  01/19/21 144 lb 9.6 oz (65.6 kg)  09/17/20 140 lb 12.8 oz (63.9 kg)     Physical Exam Vitals and nursing note reviewed.  Constitutional:      General: She is not  in acute distress.    Appearance: She is well-developed.  Cardiovascular:     Rate and Rhythm: Normal rate and regular rhythm.  Pulmonary:     Effort: Pulmonary effort is normal.     Breath sounds: Normal breath sounds.  Musculoskeletal:     Right lower leg: No edema.     Left lower leg: No edema.  Neurological:     Mental Status: She is alert and oriented to person, place, and time.  Psychiatric:        Mood and Affect: Mood normal.        Behavior: Behavior normal.      Imaging: DG Chest 2 View  Result Date: 01/31/2021 CLINICAL DATA:  Shortness of breath since COVID diagnosis 12/27/2020 EXAM: CHEST - 2 VIEW COMPARISON:  01/29/2021, 01/24/2021, 12/01/2016 FINDINGS: No consolidation, features of edema, pneumothorax, or effusion. Pulmonary vascularity is normally distributed. The cardiomediastinal contours are unremarkable. No acute osseous or soft tissue abnormality. IMPRESSION: No acute cardiopulmonary abnormality. Electronically Signed   By: Kreg Shropshire M.D.   On: 01/31/2021 03:11   DG Chest 2 View  Result Date: 01/29/2021 CLINICAL DATA:  Chest pain EXAM: CHEST - 2 VIEW COMPARISON:  01/24/2021 FINDINGS: The heart size and mediastinal contours are within normal limits. Both lungs are clear. The visualized skeletal structures are unremarkable. IMPRESSION: No active cardiopulmonary disease. Electronically Signed   By: Gaylyn Rong M.D.   On: 01/29/2021 14:52   DG Chest 2 View  Result Date: 01/24/2021 CLINICAL DATA:  COVID positive mid February, persistent shortness of breath, headache and chest discomfort. EXAM: CHEST - 2 VIEW COMPARISON:  05/31/2017. FINDINGS: Trachea is midline. Heart size normal. Lungs are clear. No pleural fluid. Mild pectus deformity. IMPRESSION: No acute findings. Electronically Signed   By: Leanna Battles M.D.   On: 01/24/2021 13:28   US OB Comp Less 14 Wks  Result Date: 01/31/2021 CLINICAL DATA:  Abdominal pain EXAM: OBSTETRIC <14 WK ULTRASOUND  TECHNIQUE: Transabdominal ultrasound was performed for evaluation of the gestation as well as the maternal uterus and adnexal regions. COMPARISON:  Ultrasound 05/17/2017 FINDINGS: Intrauterine gestational sac: Single Yolk sac:  Not Visualized. Embryo:  Visualized. Cardiac Activity: Visualized. Heart Rate: 173 bpm CRL: 49.4 mm   11 w 5 d                  [place] EDC: 08/17/2021 Subchorionic hemorrhage: Small amount of hypoechoic subchorionic hemorrhage. Maternal uterus/adnexae: Anteverted maternal uterus. No concerning adnexal lesions. No pelvic free fluid. IMPRESSION: Single intrauterine gestation at 11 weeks, 5 days by crown-rump length sonographic estimation. Small volume subchorionic hemorrhage. No other acute sonographic abnormalities. Nonvisualization of the yolk sac is a physiologic finding beyond 10 weeks. Electronically Signed   By: Kreg Shropshire M.D.   On: 01/31/2021 04:36   US Abdomen Limited RUQ (LIVER/GB)  Result Date: 01/31/2021 CLINICAL DATA:  Right upper quadrant pain for 2 days, COVID positive February 2022 EXAM: ULTRASOUND ABDOMEN LIMITED RIGHT UPPER QUADRANT COMPARISON:  CT 04/06/2020 FINDINGS: Gallbladder: No gallstones or wall thickening visualized. No sonographic Murphy sign noted by sonographer. Common bile duct: Diameter: 6.6 mm, borderline dilated, no visible intraductal gallstones though the biliary tree is incompletely visualized. Liver: Mildly increased hepatic echogenicity with some loss of portal triad definition could reflect mild hepatic steatosis. No concerning focal liver lesion no intrahepatic biliary ductal dilatation. Visible liver surface contour is smooth. Portal vein is patent on color Doppler imaging with normal direction of blood flow towards the liver. Other: None. IMPRESSION: 1. Mild hepatic steatosis. 2. Borderline dilated common bile duct. No visible intraductal stones or intrahepatic biliary ductal dilatation. Correlate with liver serologies and consider MRCP if there is  clinical concern for biliary obstruction/choledocholithiasis. 3. Otherwise unremarkable right upper quadrant ultrasound. Electronically Signed   By: Kreg Shropshire M.D.   On: 01/31/2021 04:39     Assessment & Plan:   History of COVID-19 Cough Nasal congestion:   Stay well hydrated  Stay active  Deep breathing exercises  May take tylenol for fever or pain  May try warm tea in moderation  May try tums for reflux  May try zyrtec for sinus congestion  Please keep appointment with OB/GYN to discuss     Follow up:  Follow up in 2 weeks or sooner if needed      Ivonne Andrew, NP 02/01/2021

## 2021-02-01 NOTE — Patient Instructions (Signed)
Covid 19 Cough Nasal congestion:   Stay well hydrated  Stay active  Deep breathing exercises  May take tylenol for fever or pain  May try warm tea in moderation  May try tums for reflux  May try zyrtec for sinus congestion  Please keep appointment with OB/GYN to discuss     Follow up:  Follow up in 2 weeks or sooner if needed

## 2021-02-08 ENCOUNTER — Other Ambulatory Visit: Payer: Self-pay

## 2021-02-08 ENCOUNTER — Encounter: Payer: Self-pay | Admitting: Family Medicine

## 2021-02-08 ENCOUNTER — Ambulatory Visit (INDEPENDENT_AMBULATORY_CARE_PROVIDER_SITE_OTHER): Payer: 59 | Admitting: Family Medicine

## 2021-02-08 DIAGNOSIS — O0990 Supervision of high risk pregnancy, unspecified, unspecified trimester: Secondary | ICD-10-CM | POA: Insufficient documentation

## 2021-02-08 DIAGNOSIS — F419 Anxiety disorder, unspecified: Secondary | ICD-10-CM | POA: Diagnosis not present

## 2021-02-08 DIAGNOSIS — T7431XA Adult psychological abuse, confirmed, initial encounter: Secondary | ICD-10-CM | POA: Diagnosis not present

## 2021-02-08 HISTORY — DX: Supervision of high risk pregnancy, unspecified, unspecified trimester: O09.90

## 2021-02-08 LAB — POCT URINALYSIS DIP (DEVICE)
Bilirubin Urine: NEGATIVE
Glucose, UA: NEGATIVE mg/dL
Hgb urine dipstick: NEGATIVE
Ketones, ur: NEGATIVE mg/dL
Leukocytes,Ua: NEGATIVE
Nitrite: NEGATIVE
Protein, ur: NEGATIVE mg/dL
Specific Gravity, Urine: 1.025 (ref 1.005–1.030)
Urobilinogen, UA: 0.2 mg/dL (ref 0.0–1.0)
pH: 7 (ref 5.0–8.0)
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
## 2021-02-08 NOTE — Assessment & Plan Note (Signed)
Significant anxiety likely predating pregnancy but also certainly exacerbated by this. Multiple recent ED visits for workup of pulmonary symptoms as well as a visit to pulm clinic, benign findings. Has had preliminary phone appt with Asher Muir and has an appt scheduled for next week as well. Urged her to keep that appointment, will defer medication therapy at this time until that visit when she should have made a decision regarding her pregnancy. Will also message Asher Muir to make her aware of our discussion today.
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
## 2021-02-08 NOTE — Progress Notes (Signed)
Complains of pelvic pains
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
## 2021-02-14 ENCOUNTER — Other Ambulatory Visit: Payer: Self-pay

## 2021-02-14 ENCOUNTER — Other Ambulatory Visit (HOSPITAL_COMMUNITY)
Admission: RE | Admit: 2021-02-14 | Discharge: 2021-02-14 | Disposition: A | Discharge status: Home / Self Care | Payer: 59 | Source: Ambulatory Visit | Attending: Family Medicine | Admitting: Family Medicine

## 2021-02-14 ENCOUNTER — Encounter: Payer: Self-pay | Admitting: Nurse Practitioner

## 2021-02-14 ENCOUNTER — Ambulatory Visit (INDEPENDENT_AMBULATORY_CARE_PROVIDER_SITE_OTHER): Payer: 59 | Admitting: Lactation Services

## 2021-02-14 ENCOUNTER — Ambulatory Visit: Payer: 59 | Admitting: Clinical

## 2021-02-14 VITALS — Ht 61.0 in | Wt 144.7 lb

## 2021-02-14 DIAGNOSIS — R102 Pelvic and perineal pain: Secondary | ICD-10-CM | POA: Diagnosis not present

## 2021-02-14 DIAGNOSIS — F4323 Adjustment disorder with mixed anxiety and depressed mood: Secondary | ICD-10-CM

## 2021-02-14 MED ORDER — PROMETHAZINE HCL 25 MG PO TABS: 25.0000 mg | ORAL_TABLET | Freq: Four times a day (QID) | ORAL | 0 refills | Status: DC | PRN

## 2021-02-14 NOTE — Progress Notes (Signed)
Patient was in the office for appointment with Belinda Mcintosh, she voiced concern with having an infection.   She treated last week for yeast infection with OTC meds. She denies discharge. She is having a lot of pain and burning with intercourse. Reviewed foreplay and using lubrication. She denies UTI Symptoms. Self swab instructions given and patient obtained self swab. Advised patient that results will be back in 3 days and will be treated based on findings. Patient declined need for blood STI testing.   Patient has currently decided to keep the baby. She would like to follow up with [facility] and wants to have a pap smear. Reviewed we can set her up for [facility] and her Pap Smear.   Patient is taking Pepcid as needed for heartburn. She is taking PNV. Prescription given per standing order at patient's request for nausea and vomiting.

## 2021-02-14 NOTE — Patient Instructions (Signed)
[facility] [address] [place], [place] [address] [phone] (main office) [phone] (Ailyn Gladd's office)  Coping with Panic Attacks   What is a panic attack?  You may have had a panic attack if you experienced four or more of the symptoms listed below coming on abruptly and peaking in about 10 minutes.  Panic Symptoms   . Pounding heart  . Sweating  . Trembling or shaking  . Shortness of breath  . Feeling of choking  . Chest pain  . Nausea or abdominal distress    . Feeling dizzy, unsteady, lightheaded, or faint  . Feelings of unreality or being detached from yourself  . Fear of losing control or going crazy  . Fear of dying  . Numbness or tingling  . Chills or hot flashes      Panic attacks are sometimes accompanied by avoidance of certain places or situations. These are often situations that would be difficult to escape from or in which help might not be available. Examples might include crowded shopping malls, public transportation, restaurants, or driving.   Why do panic attacks occur?   Panic attacks are the body's alarm system gone awry. All of us have a built-in alarm system, powered by adrenaline, which increases our heart rate, breathing, and blood flow in response to danger. Ordinarily, this 'danger response system' works well. In some people, however, the response is either out of proportion to whatever stress is going on, or may come out of the blue without any stress at all.   For example, if you are walking in the woods and see a bear coming your way, a variety of changes occur in your body to prepare you to either fight the danger or flee from the situation. Your heart rate will increase to get more blood flow around your body, your breathing rate will quicken so that more oxygen is available, and your muscles will tighten in order to be ready to fight or run. You may feel nauseated as blood flow leaves your  stomach area and moves into your limbs. These bodily changes are all essential to helping you survive the dangerous situation. After the danger has passed, your body functions will begin to go back to normal. This is because your body also has a system for "recovering" by bringing your body back down to a normal state when the danger is over.   As you can see, the emergency response system is adaptive when there is, in fact, a "true" or "real" danger (e.g., bear). However, sometimes people find that their emergency response system is triggered in "everyday" situations where there really is no true physical danger (e.g., in a meeting, in the grocery store, while driving in normal traffic, etc.).   What triggers a panic attack?  Sometimes particularly stressful situations can trigger a panic attack. For example, an argument with your spouse or stressors at work can cause a stress response (activating the emergency response system) because you perceive it as threatening or overwhelming, even if there is no direct risk to your survival.  Sometimes panic attacks don't seem to be triggered by anything in particular- they may "come out of the blue". Somehow, the natural "fight or flight" emergency response system has gotten activated when there is no real danger. Why does the body go into "emergency mode" when there is no real danger?   Often, people with panic attacks are frightened or alarmed by the physical sensations of   the emergency response system. First, unexpected physical sensations are experienced (tightness in your chest or some shortness of breath). This then leads to feeling fearful or alarmed by these symptoms ("Something's wrong!", "Am I having a heart attack?", "Am I going to faint?") The mind perceives that there is a danger even though no real danger exists. This, in turn, activates the emergency response system ("fight or flight"), leading to a "full blown" panic attack. In summary, panic  attacks occur when we misinterpret physical symptoms as signs of impending death, craziness, loss of control, embarrassment, or fear of fear. Sometimes you may be aware of thoughts of danger that activate the emergency response system (for example, thinking "I'm having a heart attack" when you feel chest pressure or increased heart rate). At other times, however, you may not be aware of such thoughts. After several incidences of being afraid of physical sensations, anxiety and panic can occur in response to the initial sensations without conscious thoughts of danger. Instead, you just feel afraid or alarmed. In other words, the panic or fear may seem to occur "automatically" without you consciously telling yourself anything.   After having had one or more panic attacks, you may also become more focused on what is going on inside your body. You may scan your body and be more vigilant about noticing any symptoms that might signal the start of a panic attack. This makes it easier for panic attacks to happen again because you pick up on sensations you might otherwise not have noticed, and misinterpret them as something dangerous. A panic attack may then result.      How do I cope with panic attacks?  An important part of overcoming panic attacks involves re-interpreting your body's physical reactions and teaching yourself ways to decrease the physical arousal. This can be done through practicing the cognitive and behavioral interventions below.   Research has found that over half of people who have panic attacks show some signs of hyperventilation or overbreathing. This can produce initial sensations that alarm you and lead to a panic attack. Overbreathing can also develop as part of the panic attack and make the symptoms worse. When people hyperventilate, certain blood vessels in the body become narrower. In particular, the brain may get slightly less oxygen. This can lead to the symptoms of dizziness,  confusion, and lightheadedness that often occur during panic attacks. Other parts of the body may also get a bit less oxygen, which may lead to numbness or tingling in the hands or feet or the sensation of cold, clammy hands. It also may lead the heart to pump harder. Although these symptoms may be frightening and feel unpleasant, it is important to remember that hyperventilating is not dangerous. However, you can help overcome the unpleasantness of overbreathing by practicing Breathing Retraining.   Practice this basic technique three times a day, every day:  . Inhale. With your shoulders relaxed, inhale as slowly and deeply as you can while you count to six. If you can, use your diaphragm to fill your lungs with air.  . Hold. Keep the air in your lungs as you slowly count to four.  . Exhale. Slowly breath out as you count to six.  . Repeat. Do the inhale-hold-exhale cycle several times. Each time you do it, exhale for longer counts.  Like any new skill, Breathing Retraining requires practice. Try practicing this skill twice a day for several minutes. Initially, do not try this technique in specific situations or when you   become frightened or have a panic attack. Begin by practicing in a quiet environment to build up your skill level so that you can later use it in time of "emergency."   2. Decreasing Avoidance  Regardless of whether you can identify why you began having panic attacks or whether they seemed to come out of the blue, the places where you began having panic attacks often can become triggers themselves. It is not uncommon for individuals to begin to avoid the places where they have had panic attacks. Over time, the individual may begin to avoid more and more places, thereby decreasing their activities and often negatively impacting their quality of life. To break the cycle of avoidance, it is important to first identify the places or situations that are being avoided, and then to do some  "relearning."  To begin this intervention, first create a list of locations or situations that you tend to avoid. Then choose an avoided location or situation that you would like to target first. Now develop an "exposure hierarchy" for this situation or location. An "exposure hierarchy" is a list of actions that make you feel anxious in this situation. Order these actions from least to most anxiety-producing. It is often helpful to have the first item on your hierarchy involve thinking or imagining part of the feared/avoided situation.   Here is an example of an exposure hierarchy for decreasing avoidance of the grocery store. Note how it is ordered from the least amount of anxiety (at the top) to the most anxiety (at the bottom):  . Think about going to the grocery store alone.  . Go to the grocery store with a friend or family member.  . Go to the grocery store alone to pick up a few small items (5-10 minutes in the store).  . Shopping for 10-20 minutes in the store alone.  . Doing the shopping for the week by myself (20-30 minutes in the store).   Your homework is to "expose" yourself to the lowest item on your hierarchy and use your breathing relaxation and coping statements (see below) to help you remain in the situation. Practice this several times during the upcoming week. Once you have mastered each item with minimal anxiety, move on to the next higher action on your list.   Cognitive Interventions  1. Identify your negative self-talk Anxious thoughts can increase anxiety symptoms and panic. The first step in changing anxious thinking is to identify your own negative, alarming self-talk. Some common alarming thoughts:  . I'm having a heart attack.            . I must be going crazy. . I think I'm dying. . People will think I'm crazy. . I'm going to pass our.  . Oh no- here it comes.  . I can't stand this.  . I've got to get out of here!  2. Use positive coping statements Changing or  disrupting a pattern of anxious thoughts by replacing them with more calming or supportive statements can help to divert a panic attack. Some common helpful coping statements:  . This is not an emergency.  . I don't like feeling this way, but I can accept it.  . I can feel like this and still be okay.  . This has happened before, and I was okay. I'll be okay this time, too.  . I can be anxious and still deal with this situation.    /Emotional Wellbeing Apps and Websites Here are a few   free apps meant to help you to help yourself.  To find, try searching on the internet to see if the app is offered on Apple/Android devices. If your first choice doesn't come up on your device, the good news is that there are many choices! Play around with different apps to see which ones are helpful to you.    Calm This is an app meant to help increase calm feelings. Includes info, strategies, and tools for tracking your feelings.      Calm Harm  This app is meant to help with self-harm. Provides many 5-minute or 15-min coping strategies for doing instead of hurting yourself.       Healthy Minds Health Minds is a problem-solving tool to help deal with emotions and cope with stress you encounter wherever you are.      MindShift This app can help people cope with anxiety. Rather than trying to avoid anxiety, you can make an important shift and face it.      MY3  MY3 features a support system, safety plan and resources with the goal of offering a tool to use in a time of need.       My Life My Voice  This mood journal offers a simple solution for tracking your thoughts, feelings and moods. Animated emoticons can help identify your mood.       Relax Melodies Designed to help with sleep, on this app you can mix sounds and meditations for relaxation.      Smiling Mind Smiling Mind is meditation made easy: it's a simple tool that helps put a smile on your mind.        Stop, Breathe &  Think  A friendly, simple guide for people through meditations for mindfulness and compassion.  Stop, Breathe and Think Kids Enter your current feelings and choose a "mission" to help you cope. Offers videos for certain moods instead of just sound recordings.       Team Orange The goal of this tool is to help teens change how they think, act, and react. This app helps you focus on your own good feelings and experiences.      The Virtual Hope Box The Virtual Hope Box (VHB) contains simple tools to help patients with coping, relaxation, distraction, and positive thinking.     

## 2021-02-15 LAB — CERVICOVAGINAL ANCILLARY ONLY
Bacterial Vaginitis (gardnerella): NEGATIVE
Candida Glabrata: NEGATIVE
Candida Vaginitis: POSITIVE — AB
Chlamydia: NEGATIVE
Comment: NEGATIVE
Comment: NEGATIVE
Comment: NEGATIVE
Comment: NEGATIVE
Comment: NEGATIVE
Comment: NORMAL
Neisseria Gonorrhea: NEGATIVE
Trichomonas: NEGATIVE

## 2021-02-18 ENCOUNTER — Institutional Professional Consult (permissible substitution): Payer: 59 | Admitting: Internal Medicine

## 2021-02-21 ENCOUNTER — Other Ambulatory Visit (HOSPITAL_COMMUNITY)
Admission: RE | Admit: 2021-02-21 | Discharge: 2021-02-21 | Disposition: A | Discharge status: Home / Self Care | Payer: 59 | Source: Ambulatory Visit | Attending: Obstetrics and Gynecology | Admitting: Obstetrics and Gynecology

## 2021-02-21 ENCOUNTER — Other Ambulatory Visit: Payer: Self-pay

## 2021-02-21 ENCOUNTER — Encounter: Payer: Self-pay | Admitting: Obstetrics and Gynecology

## 2021-02-21 ENCOUNTER — Ambulatory Visit (INDEPENDENT_AMBULATORY_CARE_PROVIDER_SITE_OTHER): Payer: 59 | Admitting: Obstetrics and Gynecology

## 2021-02-21 VITALS — BP 100/64 | HR 90 | Wt 148.6 lb

## 2021-02-21 DIAGNOSIS — O0990 Supervision of high risk pregnancy, unspecified, unspecified trimester: Secondary | ICD-10-CM | POA: Diagnosis present

## 2021-02-21 DIAGNOSIS — Z8616 Personal history of COVID-19: Secondary | ICD-10-CM

## 2021-02-21 DIAGNOSIS — R0981 Nasal congestion: Secondary | ICD-10-CM

## 2021-02-21 MED ORDER — TERCONAZOLE 0.4 % VA CREA: 1.0000 | TOPICAL_CREAM | Freq: Every day | VAGINAL | 0 refills | Status: DC
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
## 2021-02-21 NOTE — Patient Instructions (Signed)
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
## 2021-02-22 ENCOUNTER — Other Ambulatory Visit: Payer: 59

## 2021-02-22 DIAGNOSIS — O0990 Supervision of high risk pregnancy, unspecified, unspecified trimester: Secondary | ICD-10-CM

## 2021-02-22 LAB — GC/CHLAMYDIA PROBE AMP (~~LOC~~) NOT AT ARMC
Chlamydia: NEGATIVE
Comment: NEGATIVE
Comment: NORMAL
Neisseria Gonorrhea: NEGATIVE

## 2021-02-22 LAB — OB RESULTS CONSOLE RUBELLA ANTIBODY, IGM: Rubella: IMMUNE

## 2021-02-23 LAB — CBC/D/PLT+RPR+RH+ABO+RUB AB...
Antibody Screen: NEGATIVE
Basophils Absolute: 0 10*3/uL (ref 0.0–0.2)
Basos: 0 %
EOS (ABSOLUTE): 0.1 10*3/uL (ref 0.0–0.4)
Eos: 1 %
HCV Ab: <0.1 s/co ratio (ref 0.0–0.9)
HIV Screen 4th Generation wRfx: NONREACTIVE
Hematocrit: 37.4 % (ref 34.0–46.6)
Hemoglobin: 12.6 g/dL (ref 11.1–15.9)
Hepatitis B Surface Ag: NEGATIVE
Immature Grans (Abs): 0 10*3/uL (ref 0.0–0.1)
Immature Granulocytes: 0 %
Lymphocytes Absolute: 1.6 10*3/uL (ref 0.7–3.1)
Lymphs: 20 %
MCH: 30.5 pg (ref 26.6–33.0)
MCHC: 33.7 g/dL (ref 31.5–35.7)
MCV: 91 fL (ref 79–97)
Monocytes Absolute: 0.3 10*3/uL (ref 0.1–0.9)
Monocytes: 4 %
Neutrophils Absolute: 5.7 10*3/uL (ref 1.4–7.0)
Neutrophils: 75 %
Platelets: 157 10*3/uL (ref 150–450)
RBC: 4.13 x10E6/uL (ref 3.77–5.28)
RDW: 12.8 % (ref 11.7–15.4)
RPR Ser Ql: NONREACTIVE
Rh Factor: POSITIVE
Rubella Antibodies, IGG: 3.55 index (ref >0.99)
WBC: 7.7 10*3/uL (ref 3.4–10.8)

## 2021-02-23 LAB — CULTURE, OB URINE

## 2021-02-23 LAB — GLUCOSE TOLERANCE, 2 HOURS W/ 1HR
Glucose, 1 hour: 153 mg/dL (ref 65–179)
Glucose, 2 hour: 119 mg/dL (ref 65–152)
Glucose, Fasting: 78 mg/dL (ref 65–91)

## 2021-02-23 LAB — URINE CULTURE, OB REFLEX

## 2021-02-23 LAB — HCV INTERPRETATION

## 2021-02-23 LAB — HEMOGLOBIN A1C
Est. average glucose Bld gHb Est-mCnc: 97 mg/dL
Hgb A1c MFr Bld: 5 % (ref 4.8–5.6)

## 2021-02-24 NOTE — Telephone Encounter (Signed)
Monitor was never worn. Order will be cancelled.

## 2021-02-28 NOTE — BH Specialist Note (Signed)
Integrated Behavioral Health via Telemedicine Visit  02/28/2021 [name] [id number]  Number of Integrated Behavioral Health visits: 1 Session Start time: 2:59  Session End time: 3:18 Total time: 67  Referring Provider: Merian Capron, MD Patient/Family location: Home [facility] Provider location: [facility]  All persons participating in visit: [name] and [facility] Belinda Mcintosh   Types of Service: Individual psychotherapy and Video visit  I connected with Redmond Baseman and/or Eliezer Mccoy Torres's n/a via  Telephone or Video Enabled Telemedicine Application  (Video is Caregility application) and verified that I am speaking with the correct person using two identifiers. Discussed confidentiality: Yes   I discussed the limitations of telemedicine and the availability of in person appointments.  Discussed there is a possibility of technology failure and discussed alternative modes of communication if that failure occurs.  I discussed that engaging in this telemedicine visit, they consent to the provision of behavioral healthcare and the services will be billed under their insurance.  Patient and/or legal guardian expressed understanding and consented to Telemedicine visit: Yes   Presenting Concerns: Patient and/or family reports the following symptoms/concerns: Pt states she is feeling much better, as nausea/vomiting/heartburn has decreased and has come to terms with pregnancy; pt says she is no longer feeling depressed or anxious, and her primary concerns are mild cramping in her legs and wants to know when it will be safe to start working out again, as she did prior to pregnancy. Pt is also interested in taking childbirth classes.  Duration of problem: Current pregnancy; Severity of problem: mild  Patient and/or Family's Strengths/Protective Factors: Social connections, Concrete supports in place (healthy food, safe  environments, etc.), Sense of purpose and Physical Health (exercise, healthy diet, medication compliance, etc.)   Goals Addressed: Patient will: 1.  Maintain reduction of symptoms of: anxiety and depression  2.  Demonstrate ability to: Increase healthy adjustment to current life circumstances  Progress towards Goals: Ongoing  Interventions: Interventions utilized:  Solution-Focused Strategies and Psychoeducation and/or Health Education Standardized Assessments completed: Not Needed  Patient and/or Family Response: Pt agrees to treatment plan  Assessment: Patient currently experiencing Adjustment disorder with mixed anxious and depressed mood, in remission  Patient may benefit from continued psychoeducation and brief therapeutic interventions regarding coping with symptoms of anxiety and depression .  Plan: 1. Follow up with behavioral health clinician on : Call Ariela Mochizuki at [phone] as needed, if symptoms of anxiety and/or depression return 2. Behavioral recommendations:  -Continue taking prenatal vitamin daily as prescribed Armed forces logistics/support/administrative officer Tour of [facility] [place], AND register for childbirth education class at [email or web address]  -Discuss medical concerns (leg cramping and workout restrictions) at new ob visit  -Consider reading through Postpartum Planner (on After Visit Summary) 3. Referral(s): Integrated [employer] (In Clinic)  I discussed the assessment and treatment plan with the patient and/or parent/guardian. They were provided an opportunity to ask questions and all were answered. They agreed with the plan and demonstrated an understanding of the instructions.   They were advised to call back or seek an in-person evaluation if the symptoms worsen or if the condition fails to improve as anticipated.  Rae Lips, LCSW   Depression screen [facility] 2/9 02/21/2021 02/14/2021 01/19/2021 12/30/2020 11/18/2020  Decreased Interest 0 2 0 0 0  Down, Depressed,  Hopeless 0 2 0 0 0  PHQ - 2 Score 0 4 0 0 0  Altered sleeping 0 2 - - -  Tired, decreased energy 0 2 - - -  Change in appetite 0 2 - - -  Feeling bad or failure about yourself  0 0 - - -  Trouble concentrating 0 0 - - -  Moving slowly or fidgety/restless 0 0 - - -  Suicidal thoughts 0 0 - - -  PHQ-9 Score 0 10 - - -  Some recent data might be hidden   GAD 7 : Generalized Anxiety Score 02/21/2021 02/14/2021 04/23/2017 04/16/2017  Nervous, Anxious, on Edge 2 1 0 0  Control/stop worrying 0 0 0 0  Worry too much - different things 0 1 0 0  Trouble relaxing 0 1 0 0  Restless 0 0 0 0  Easily annoyed or irritable 1 1 0 0  Afraid - awful might happen 0 1 0 0  Total GAD 7 Score 3 5 0 0   '

## 2021-03-01 ENCOUNTER — Telehealth: Payer: Self-pay | Admitting: Lactation Services

## 2021-03-01 NOTE — Telephone Encounter (Signed)
Returned patients call. Patient would like to see the doctor today. She reports she was prescribed a cream for a vaginal infection and does not feel like it is working. She feels her symptoms have improved slightly. She is now using Monistat for 2 days and is helping. She has the 3 day. She is placing in at bedtime. Reviewed finishing medication in entirety and call back if not improving by end of week.   She reports she has a sore throat and clogged ears. She is concerned she has Strep, She does not feel like it is Covid. Reviewed she needs to follow up with PCP or Urgent Care or these issues. Patient voiced understanding.

## 2021-03-01 NOTE — Telephone Encounter (Signed)
Patient called and LM that she is having some health issues and would like to be seen in the office.

## 2021-03-02 ENCOUNTER — Encounter: Payer: Self-pay | Admitting: General Practice

## 2021-03-09 ENCOUNTER — Telehealth: Payer: Self-pay | Admitting: Lactation Services

## 2021-03-09 NOTE — Telephone Encounter (Signed)
Patient called and LM on Nurse voicemail. She is having headaches daily and is concerned.   Patient reports she is having daily headaches, she has been taking Tylenol for the headaches.   Patient has been taking her BP and her BP this morning was 104/67. Last night it was 107/89. Reviewed sitting for 10-15 minutes and have both feet on the floor and no talking while taking. She reports she has been standing and not sitting for a period of time before taking.   She is having some runny nose every day. She reports she sometimes has allergies. She is having some congestion also since having Covid. Reviewed trying Zyrtec, she reports that it hurts her stomach. She reports she wants to see ENT. Reviewed she has an appointment on 5/18 with Dr. Ezzard Standing and has an appointment with Pulmonary care also.   Patient reports she is not drinking well. Reviewed that being dehydrated can cause headaches also and to try to push fluids. She reports some nausea, she has nausea meds but make her sleepy.   Patient to call back with any questions or concerns as needed.

## 2021-03-14 ENCOUNTER — Ambulatory Visit (INDEPENDENT_AMBULATORY_CARE_PROVIDER_SITE_OTHER): Payer: 59 | Admitting: Clinical

## 2021-03-14 DIAGNOSIS — F4320 Adjustment disorder, unspecified: Secondary | ICD-10-CM

## 2021-03-14 NOTE — Patient Instructions (Signed)
[facility] [place], [place] [address] [phone] (main office) [phone] [facility] office)  [email or web address] (view virtual tour of women's hospital; register for childbirth classes)     BRAINSTORMING  Develop a Plan Goals: . Provide a way to start conversation about your new life with a baby . Assist parents in recognizing and using resources within their reach . Help pave the way before birth for an easier period of transition afterwards.  Make a list of the following information to keep in a central location: . Full name of Mom and Partner: _____________________________________________ . 34 full name and Date of Birth: ___________________________________________ . Home Address: ___________________________________________________________ ________________________________________________________________________ . Home Phone: ____________________________________________________________ . Parents' cell numbers: _____________________________________________________ ________________________________________________________________________ . Name and contact info for OB: ______________________________________________ . Name and contact info for Pediatrician:________________________________________ . Contact info for Lactation Consultants: ________________________________________  REST and SLEEP *You each need at least 4-5 hours of uninterrupted sleep every day. Write specific names and contact information.* . How are you going to rest in the postpartum period? While partner's home? When partner returns to work? When you both return to work? Marland Kitchen Where will your baby sleep? Marland Kitchen Who is available to help during the day? Evening? Night? . Who could move in for a period to help support you? Marland Kitchen What are some ideas to help you get enough  sleep? __________________________________________________________________________________________________________________________________________________________________________________________________________________________________________ NUTRITIOUS FOOD AND DRINK *Plan for meals before your baby is born so you can have healthy food to eat during the immediate postpartum period.* . Who will look after breakfast? Lunch? Dinner? List names and contact information. Brainstorm quick, healthy ideas for each meal. . What can you do before baby is born to prepare meals for the postpartum period? . How can others help you with meals? Marland Kitchen Which grocery stores provide online shopping and delivery? Marland Kitchen Which restaurants offer take-out or delivery options? ______________________________________________________________________________________________________________________________________________________________________________________________________________________________________________________________________________________________________________________________________________________________________________________________________  CARE FOR MOM *It's important that mom is cared for and pampered in the postpartum period. Remember, the most important ways new mothers need care are: sleep, nutrition, gentle exercise, and time off.* . Who can come take care of mom during this period? Make a list of people with their contact information. . List some activities that make you feel cared for, rested, and energized? Who can make sure you have opportunities to do these things? . Does mom have a space of her very own within your home that's just for her? Make a "[facility]" where she can be comfortable, rest, and renew herself  daily. ______________________________________________________________________________________________________________________________________________________________________________________________________________________________________________________________________________________________________________________________________________________________________________________________________    CARE FOR AND FEEDING BABY *Knowledgeable and encouraging people will offer the best support with regard to feeding your baby.* . Educate yourself and choose the best feeding option for your baby. . Make a list of people who will guide, support, and be a resource for you as your care for and feed your baby. (Friends that have breastfed or are currently breastfeeding, lactation consultants, breastfeeding support groups, etc.) . Consider a postpartum doula. (These websites can give you information: dona.org & BuyingShow.es) . Seek out local breastfeeding resources like the breastfeeding support group at [employer] or [employer]. ______________________________________________________________________________________________________________________________________________________________________________________________________________________________________________________________________________________________________________________________________________________________________________________________________  Verner Chol AND ERRANDS . Who can help with a thorough cleaning before baby is born? . Make a list of people who will help with housekeeping and chores, like laundry, light cleaning, dishes, bathrooms, etc. . Who can run some errands for you? Marland Kitchen What can you do to make sure you are stocked with basic  supplies before baby is born? . Who is going to do the  shopping? ______________________________________________________________________________________________________________________________________________________________________________________________________________________________________________________________________________________________________________________________________________________________________________________________________     Family Adjustment *Nurture yourselves.it helps parents be more loving and allows for better bonding with their child.* . What sorts of things do you and partner enjoy doing together? Which activities help you to connect and strengthen your relationship? Make a list of those things. Make a list of people whom you trust to care for your baby so you can have some time together as a couple. . What types of things help partner feel connected to Mom? Make a list. . What needs will partner have in order to bond with baby? . Other children? Who will care for them when you go into labor and while you are in the hospital? . Think about what the needs of your older children might be. Who can help you meet those needs? In what ways are you helping them prepare for bringing baby home? List some specific strategies you have for family adjustment. _______________________________________________________________________________________________________________________________________________________________________________________________________________________________________________________________________________________________________________________________________________  SUPPORT *Someone who can empathize with experiences normalizes your problems and makes them more bearable.* . Make a list of other friends, neighbors, and/or co-workers you know with infants (and small children, if applicable) with whom you can connect. . Make a list of local or online support groups, mom groups, etc. in which you can be  involved. ______________________________________________________________________________________________________________________________________________________________________________________________________________________________________________________________________________________________________________________________________________________________________________________________________  Childcare Plans . Investigate and plan for childcare if mom is returning to work. . Talk about mom's concerns about her transition back to work. . Talk about partner's concerns regarding this transition.  Mental Health *Your mental health is one of the highest priorities for a pregnant or postpartum mom.* . 1 in 5 women experience anxiety and/or depression from the time of conception through the first year after birth. . Postpartum Mood Disorders are the #1 complication of pregnancy and childbirth and the suffering experienced by these mothers is not necessary! These illnesses are temporary and respond well to treatment, which often includes self-care, social support, talk therapy, and medication when needed. . Women experiencing anxiety and depression often say things like: "I'm supposed to be happy.why do I feel so sad?", "Why can't I snap out of it?", "I'm having thoughts that scare me." . There is no need to be embarrassed if you are feeling these symptoms: o Overwhelmed, anxious, angry, sad, guilty, irritable, hopeless, exhausted but can't sleep o You are NOT alone. You are NOT to blame. With help, you WILL be well. . Where can I find help? Medical professionals such as your OB, midwife, gynecologist, family practitioner, primary care provider, pediatrician, or mental health providers; [facility] support groups: Feelings After Birth, Breastfeeding Support Group, Baby and Me Group, and Fit 4 Two exercise classes. . You have permission to ask for help. It will confirm your feelings, validate your  experiences, share/learn coping strategies, and gain support and encouragement as you heal. You are important! BRAINSTORM . Make a list of local resources, including resources for mom and for partner. . Identify support groups. . Identify people to call late at night - include names and contact info. . Talk with partner about perinatal mood and anxiety disorders. . Talk with your OB, midwife, and doula about baby blues and about perinatal mood and anxiety disorders. . Talk with your pediatrician about perinatal mood and anxiety disorders.   Support & Sanity Savers   What do you really need?  . Basics . In preparing for a new baby, many expectant parents spend hours  shopping for baby clothes, decorating the nursery, and deciding which car seat to buy. Yet most don't think much about what the reality of parenting a newborn will be like, and what they need to make it through that. So, here is the advice of experienced parents. We know you'll read this, and think "they're exaggerating, I don't really need that." Just trust [place] on these, OK? Plan for all of this, and if it turns out you don't need it, come back and teach [place] how you did it!  Satira Anis (Once baby's survival needs are met, make sure you attend to your own survival needs!) . Sleep . An average newborn sleeps 16-18 hours per day, over 6-7 sleep periods, rarely more than three hours at a time. It is normal and healthy for a newborn to wake throughout the night... but really hard on parents!! . Naps. Prioritize sleep above any responsibilities like: cleaning house, visiting friends, running errands, etc.  Sleep whenever baby sleeps. If you can't nap, at least have restful times when baby eats. The more rest you get, the more patient you will be, the more emotionally stable, and better at solving problems.  . Food . You may not have realized it would be difficult to eat when you have a newborn. Yet, when we talk to . countless new  parents, they say things like "it may be 2:00 pm when I realize I haven't had breakfast yet." Or "every time we sit down to dinner, baby needs to eat, and my food gets cold, so I don't bother to eat it." . Finger food. Before your baby is born, stock up with one months' worth of food that: 1) you can eat with one hand while holding a baby, 2) doesn't need to be prepped, 3) is good hot or cold, 4) doesn't spoil when left out for a few hours, and 5) you like to eat. Think about: nuts, dried fruit, Clif bars, pretzels, jerky, gogurt, baby carrots, apples, bananas, crackers, cheez-n-crackers, string cheese, hot pockets or frozen burritos to microwave, garden burgers and breakfast pastries to put in the toaster, yogurt drinks, etc. . Restaurant Menus. Make lists of your favorite restaurants & menu items. When family/friends want to help, you can give specific information without much thought. They can either bring you the food or send gift cards for just the right meals. Rosaura Carpenter Meals.  Take some time to make a few meals to put in the freezer ahead of time.  Easy to freeze meals can be anything such as soup, lasagna, chicken pie, or spaghetti sauce. . Set up a Meal Schedule.  Ask friends and family to sign up to bring you meals during the first few weeks of being home. (It can be passed around at baby showers!) You have no idea how helpful this will be until you are in the throes of parenting.  https://hamilton-[email or web address] is a great website to check out. . Emotional Support . Know who to call when you're stressed out. Parenting a newborn is very challenging work. There are times when it totally overwhelms your normal coping abilities. EVERY NEW PARENT NEEDS TO HAVE A PLAN FOR WHO TO CALL WHEN THEY JUST CAN'T COPE ANY MORE. (And it has to be someone other than the baby's other parent!) Before your baby is born, come up with at least one person you can call for support - write their phone number down and post it on the  refrigerator. Marland Kitchen Anxiety & Sadness. Baby  blues are normal after pregnancy; however, there are more severe types of anxiety & sadness which can occur and should not be ignored.  They are always treatable, but you have to take the first step by reaching out for help. [facility] offers a "Mom Talk" group which meets every Tuesday from 10 am - 11 am.  This group is for new moms who need support and connection after their babies are born.  Call [phone].  Marland Kitchen Really, Really Helpful (Plan for them! Make sure these happen often!!) . Physical Support with Taking Care of Yourselves . Asking friends and family. Before your baby is born, set up a schedule of people who can come and visit and help out (or ask a friend to schedule for you). Any time someone says "let me know what I can do to help," sign them up for a day. When they get there, their job is not to take care of the baby (that's your job and your joy). Their job is to take care of you!  . Postpartum doulas. If you don't have anyone you can call on for support, look into postpartum doulas:  professionals at helping parents with caring for baby, caring for themselves, getting breastfeeding started, and helping with household tasks. www.padanc.org is a helpful website for learning about doulas in our area. . Peer Support / Parent Groups . Why: One of the greatest ideas for new parents is to be around other new parents. Parent groups give you a chance to share and listen to others who are going through the same season of life, get a sense of what is normal infant development by watching several babies learn and grow, share your stories of triumph and struggles with empathetic ears, and forgive your own mistakes when you realize all parents are learning by trial and error. . Where to find: There are many places you can meet other new parents throughout our community.  [facility] offers the following classes for new moms and their little ones:  Baby  and Me (Birth to [place]) and Breastfeeding Support Group. Go to www.conehealthybaby.com or call [phone] for more information. . Time for your Relationship . It's easy to get so caught up in meeting baby's immediate needs that it's hard to find time to connect with your partner, and meet the needs of your relationship. It's also easy to forget what "quality time with your partner" actually looks like. If you take your baby on a date, you'd be amazed how much of your couple time is spent feeding the baby, diapering the baby, admiring the baby, and talking about the baby. . Dating: Try to take time for just the two of you. Babysitter tip: Sometimes when moms are breastfeeding a newborn, they find it hard to figure out how to schedule outings around baby's unpredictable feeding schedules. Have the babysitter come for a three hour period. When she comes over, if baby has just eaten, you can leave right away, and come back in two hours. If baby hasn't fed recently, you start the date at home. Once baby gets hungry and gets a good feeding in, you can head out for the rest of your date time. . Date Nights at Home: If you can't get out, at least set aside one evening a week to prioritize your relationship: whenever baby dozes off or doesn't have any immediate needs, spend a little time focusing on each other. . Potential conflicts: The main relationship conflicts that come up for new  parents are: issues related to sexuality, financial stresses, a feeling of an unfair division of household tasks, and conflicts in parenting styles. The more you can work on these issues before baby arrives, the better!  Clint Guy and Frills (Don't forget these. and don't feel guilty for indulging in them!) . Everyone has something in life that is a fun little treat that they do just for themselves. It may be: reading the morning paper, or going for a daily jog, or having coffee with a friend once a week, or going to a movie on Friday  nights, or fine chocolates, or bubble baths, or curling up with a good book. . Unless you do fun things for yourself every now and then, it's hard to have the energy for fun with your baby. Whatever your "special" treats are, make sure you find a way to continue to indulge in them after your baby is born. These special moments can recharge you, and allow you to return to baby with a new joy   PERINATAL MOOD DISORDERS: [place]   Emergency and Crisis Resources:  If you are an imminent risk to self or others, are experiencing intense personal distress, and/or have noticed significant changes in activities of daily living, call:  . 911 . [facility]: [phone] . Mobile Crisis: 269 340 4389 . National Suicide Hotline: (917) 719-3006 Or visit the following crisis centers: . Local Emergency Departments . [facility]: [address] [place]. Hours: 8:30AM-5PM. Insurance Accepted: Medicaid, Medicare, and Uninsured.  Marland Kitchen [facility]  [address] [place] Mon-Friday 8am-3pm  [phone]                                                                                    Non-Crisis Resources: To identify specific providers that are covered by your insurance, contact your insurance company or local agencies: [place]: [phone] [facility]: [phone] [place]: [phone] Postpartum Support International- [place] 1-(229)372-8699                                                      Outpatient therapy and medication management providers:  [facility] [phone] Hours: 9AM-5PM  Insurance Accepted: Alben Spittle, Lorella Nimrod, Freddrick March, [place], Medicare  [facility] ([place]) [phone] Hours: 8AM-5PM  nsurance Accepted: All insurances EXCEPT AARP, [place], [place], and  [place]: [phone]             Hours: 8AM-8PM Insurance Accepted: Cristal Ford, Freddrick March, [place], Medicare, [phone] [facility]: [phone] Hours: 8:30AM-7PM Insurance Accepted: Cristal Ford, Medicaid, Medicare, Tricare, [employer] Counseling:  [place] Accepted:  Holland Falling, Lorella Nimrod, [facility], [employer], [facility] [phone] Hours: 9AM-5:30PM Insurance Accepted: Alben Spittle, [place], [place], and Medicaid, [employer], [employer]  [facility]:  [phone] Hours: 9am-5pm Insurance Accepted: BCBS; they do not accept Medicaid/Medicare [facility] [place]: [phone] Hours: 9am-9pm Insurance Accepted: All major insurance including Medicaid and Medicare [facility]: [phone] Hours: 9AM-5:30PM Insurance Accepted: All insurances EXCEPT Medicaid and Medicare. [facility]: [place]: [phone] [place]:  [employer] (support for children in the NICU and/or with special needs), [employer] [place] [employer]: [phone]                                                                                     Online Resources: Postpartum Support International: http://jones-[email or web address]/  800-944-4PPD 2Moms Supporting Moms:  www.momssupportingmoms.net

## 2021-03-18 ENCOUNTER — Ambulatory Visit: Payer: 59 | Admitting: Internal Medicine

## 2021-03-21 ENCOUNTER — Other Ambulatory Visit: Payer: Self-pay

## 2021-03-21 ENCOUNTER — Encounter: Payer: 59 | Admitting: Nurse Practitioner

## 2021-03-21 ENCOUNTER — Ambulatory Visit (INDEPENDENT_AMBULATORY_CARE_PROVIDER_SITE_OTHER): Payer: 59 | Admitting: Obstetrics and Gynecology

## 2021-03-21 ENCOUNTER — Other Ambulatory Visit (HOSPITAL_COMMUNITY)
Admission: RE | Admit: 2021-03-21 | Discharge: 2021-03-21 | Disposition: A | Discharge status: Home / Self Care | Payer: 59 | Source: Ambulatory Visit | Attending: Obstetrics and Gynecology | Admitting: Obstetrics and Gynecology

## 2021-03-21 VITALS — BP 96/58 | HR 82 | Wt 149.6 lb

## 2021-03-21 DIAGNOSIS — N898 Other specified noninflammatory disorders of vagina: Secondary | ICD-10-CM | POA: Diagnosis present

## 2021-03-21 DIAGNOSIS — Z3A17 17 weeks gestation of pregnancy: Secondary | ICD-10-CM

## 2021-03-21 DIAGNOSIS — O26892 Other specified pregnancy related conditions, second trimester: Secondary | ICD-10-CM | POA: Insufficient documentation

## 2021-03-21 DIAGNOSIS — O09522 Supervision of elderly multigravida, second trimester: Secondary | ICD-10-CM

## 2021-03-21 DIAGNOSIS — F419 Anxiety disorder, unspecified: Secondary | ICD-10-CM

## 2021-03-21 DIAGNOSIS — O0990 Supervision of high risk pregnancy, unspecified, unspecified trimester: Secondary | ICD-10-CM
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
## 2021-03-22 ENCOUNTER — Ambulatory Visit: Payer: 59

## 2021-03-22 ENCOUNTER — Other Ambulatory Visit: Payer: Self-pay

## 2021-03-22 ENCOUNTER — Ambulatory Visit: Payer: 59 | Admitting: Internal Medicine

## 2021-03-22 LAB — CERVICOVAGINAL ANCILLARY ONLY
Bacterial Vaginitis (gardnerella): NEGATIVE
Candida Glabrata: NEGATIVE
Candida Vaginitis: POSITIVE — AB
Chlamydia: NEGATIVE
Comment: NEGATIVE
Comment: NEGATIVE
Comment: NEGATIVE
Comment: NEGATIVE
Comment: NEGATIVE
Comment: NORMAL
Neisseria Gonorrhea: NEGATIVE
Trichomonas: NEGATIVE

## 2021-03-23 LAB — AFP, SERUM, OPEN SPINA BIFIDA
AFP MoM: 0.95
AFP Value: 40.1 ng/mL
Gest. Age on Collection Date: 17.4 weeks
Maternal Age At EDD: 39.9 yr
OSBR Risk 1 IN: 10000
Test Results:: NEGATIVE
Weight: 149 [lb_av]

## 2021-03-25 ENCOUNTER — Encounter: Payer: 59 | Admitting: Nurse Practitioner

## 2021-03-25 ENCOUNTER — Encounter: Payer: 59 | Admitting: Obstetrics & Gynecology

## 2021-03-30 ENCOUNTER — Ambulatory Visit (INDEPENDENT_AMBULATORY_CARE_PROVIDER_SITE_OTHER): Payer: 59 | Admitting: Otolaryngology

## 2021-03-30 ENCOUNTER — Other Ambulatory Visit: Payer: Self-pay

## 2021-03-30 VITALS — Temp 97.7°F

## 2021-03-30 DIAGNOSIS — J310 Chronic rhinitis: Secondary | ICD-10-CM

## 2021-03-30 DIAGNOSIS — J3489 Other specified disorders of nose and nasal sinuses: Secondary | ICD-10-CM | POA: Diagnosis not present

## 2021-03-30 NOTE — Progress Notes (Signed)
HPI: Belinda Mcintosh is a 40 y.o. female who presents is referred by Venia Carbon, NP for evaluation of nasal congestion which is worse at night.  She apparently got COVID back in February.  She has some residual long-term symptoms of COVID and has been seen in the COVID clinic.  She has had more nasal congestion since COVID. She is also [redacted] weeks pregnant and since her pregnancy started she has had more congestion in her nose especially at night when she tries to sleep. Her main complaint today is congestion at night when she tries to sleep..  Past Medical History:  Diagnosis Date  . Abdominal pain   . Flank pain   . History of kidney stones   . Low back pain   . Palpitations   . Umbilical hernia 2018   Past Surgical History:  Procedure Laterality Date  . COSMETIC SURGERY    . INDUCED ABORTION    . LIPOSUCTION    . NOSE SURGERY     Social History   Socioeconomic History  . Marital status: Married    Spouse name: Not on file  . Number of children: 1  . Years of education: Not on file  . Highest education level: Not on file  Occupational History  . Not on file  Tobacco Use  . Smoking status: Former Smoker    Packs/day: 1.00    Types: Cigarettes    Quit date: 09/21/2016    Years since quitting: 4.5  . Smokeless tobacco: Never Used  Vaping Use  . Vaping Use: Never used  Substance and Sexual Activity  . Alcohol use: No  . Drug use: No  . Sexual activity: Yes    Birth control/protection: Condom  Other Topics Concern  . Not on file  Social History Narrative   ** Merged History Encounter **       Social Determinants of Health   Financial Resource Strain: Not on file  Food Insecurity: No Food Insecurity  . Worried About Programme researcher, broadcasting/film/video in the Last Year: Never true  . Ran Out of Food in the Last Year: Never true  Transportation Needs: No Transportation Needs  . Lack of Transportation (Medical): No  . Lack of Transportation (Non-Medical): No  Physical Activity:  Not on file  Stress: Not on file  Social Connections: Not on file   Family History  Problem Relation Age of Onset  . Hypertension Mother   . Diabetes Mother   . Breast cancer Mother        Breast Cancer  . Cancer Father   . Hypertension Father   . Diabetes Father   . Cancer Sister   . Fibromyalgia Sister   . Other Sister        spinal stenosis  . Alcohol abuse Neg Hx   . Arthritis Neg Hx   . Asthma Neg Hx   . COPD Neg Hx   . Depression Neg Hx   . Drug abuse Neg Hx   . Early death Neg Hx   . Heart disease Neg Hx   . Hearing loss Neg Hx   . Hyperlipidemia Neg Hx   . Kidney disease Neg Hx   . Learning disabilities Neg Hx   . Mental illness Neg Hx   . Mental retardation Neg Hx   . Miscarriages / Stillbirths Neg Hx   . Stroke Neg Hx   . Vision loss Neg Hx   . Varicose Veins Neg Hx    Allergies  Allergen Reactions  . Shrimp [Shellfish Allergy] Itching   Prior to Admission medications   Medication Sig Start Date End Date Taking? Authorizing Provider  acetaminophen (TYLENOL) 500 MG tablet Take 500 mg by mouth every 6 (six) hours as needed for headache.    [provider]  famotidine (PEPCID) 20 MG tablet Take 20 mg by mouth 2 (two) times daily.    [provider]  Prenatal Vit-Fe Fumarate-FA (PRENATAL MULTIVITAMIN) TABS tablet Take 1 tablet by mouth daily at 12 noon.    [provider]  promethazine (PHENERGAN) 25 MG tablet Take 1 tablet (25 mg total) by mouth every 6 (six) hours as needed for nausea or vomiting. 02/14/21   Adam Phenix, MD  terconazole (TERAZOL 7) 0.4 % vaginal cream Place 1 applicator vaginally at bedtime. 02/21/21   Rasch, Victorino Dike I, NP     Positive ROS: Otherwise negative  All other systems have been reviewed and were otherwise negative with the exception of those mentioned in the HPI and as above.  Physical Exam: Constitutional: Alert, well-appearing, no acute distress Ears: External ears without lesions or tenderness.  Ear canals are clear bilaterally with intact, clear TMs bilaterally. Nasal: External nose without lesions. Septum with minimal deformity and mild rhinitis.  After decongesting the nose nasal passages otherwise clear with no polyps no obstructing lesions.  Both middle meatus regions are clear with no signs of infection.. Oral: Lips and gums without lesions. Tongue and palate mucosa without lesions. Posterior oropharynx clear. Neck: No palpable adenopathy or masses Respiratory: Breathing comfortably  Skin: No facial/neck lesions or rash noted.  Procedures  Assessment: Nasal congestion related to chronic rhinitis as well as rhinitis of pregnancy.  Plan: Recommended regular use of saline nasal irrigation which will help a little bit.  This will be safe with her pregnancy.  She can also occasionally use decongestant spray such as oxymetazoline with the saline rinse but would limit use of this to no more than 3-4 times a week as this is addictive but should be okay with her pregnancy as long as she does not overuse it. Concerning other medications that are safe to take during pregnancy would leave this up to her OB doctor.  I discussed with her concerning possible use of nasal steroid spray and gave her prescription for Nasacort but before using this I would recommend having this cleared with her OB doctor. There is no intranasal anatomical structural problems.   Narda Bonds, MD   CC:

## 2021-04-01 ENCOUNTER — Ambulatory Visit: Payer: 59 | Attending: Obstetrics and Gynecology | Admitting: *Deleted

## 2021-04-01 ENCOUNTER — Other Ambulatory Visit: Payer: Self-pay | Admitting: Maternal & Fetal Medicine

## 2021-04-01 ENCOUNTER — Other Ambulatory Visit: Payer: Self-pay

## 2021-04-01 ENCOUNTER — Ambulatory Visit (HOSPITAL_BASED_OUTPATIENT_CLINIC_OR_DEPARTMENT_OTHER): Payer: 59

## 2021-04-01 ENCOUNTER — Encounter: Payer: Self-pay | Admitting: *Deleted

## 2021-04-01 DIAGNOSIS — O0992 Supervision of high risk pregnancy, unspecified, second trimester: Secondary | ICD-10-CM | POA: Insufficient documentation

## 2021-04-01 DIAGNOSIS — Z3A20 20 weeks gestation of pregnancy: Secondary | ICD-10-CM | POA: Insufficient documentation

## 2021-04-01 DIAGNOSIS — Z362 Encounter for other antenatal screening follow-up: Secondary | ICD-10-CM

## 2021-04-01 DIAGNOSIS — O09522 Supervision of elderly multigravida, second trimester: Secondary | ICD-10-CM

## 2021-04-01 DIAGNOSIS — O0990 Supervision of high risk pregnancy, unspecified, unspecified trimester: Secondary | ICD-10-CM

## 2021-04-01 DIAGNOSIS — O99212 Obesity complicating pregnancy, second trimester: Secondary | ICD-10-CM | POA: Diagnosis not present

## 2021-04-12 ENCOUNTER — Institutional Professional Consult (permissible substitution): Payer: 59 | Admitting: Internal Medicine

## 2021-04-20 ENCOUNTER — Encounter: Payer: 59 | Admitting: Family Medicine

## 2021-04-21 ENCOUNTER — Encounter: Payer: Self-pay | Admitting: Internal Medicine

## 2021-04-21 ENCOUNTER — Other Ambulatory Visit: Payer: Self-pay

## 2021-04-21 ENCOUNTER — Ambulatory Visit (INDEPENDENT_AMBULATORY_CARE_PROVIDER_SITE_OTHER): Payer: 59 | Admitting: Internal Medicine

## 2021-04-21 VITALS — BP 114/60 | HR 82 | Temp 97.8°F | Ht 61.0 in | Wt 152.0 lb

## 2021-04-21 DIAGNOSIS — U071 COVID-19: Secondary | ICD-10-CM | POA: Diagnosis not present

## 2021-04-21 DIAGNOSIS — R0981 Nasal congestion: Secondary | ICD-10-CM

## 2021-04-21 DIAGNOSIS — R0602 Shortness of breath: Secondary | ICD-10-CM

## 2021-04-21 MED ORDER — FLUTICASONE PROPIONATE 50 MCG/ACT NA SUSP: 1.0000 | Freq: Every day | NASAL | 2 refills | Status: DC

## 2021-04-21 NOTE — Patient Instructions (Addendum)
The patient should have follow up scheduled with myself in 1 months.   Prior to next visit patient should have: Full set of PFTs - next available  ipratropium - 1 spray on each side of your nose twice a day for first week, then 1 spray on each side.   Instructions for use: If you also use a saline nasal spray or rinse, use that first. Position the head with the chin slightly tucked. Use the right hand to spray into the left nostril and the right hand to spray into the left nostril.   Point the bottle away from the septum of your nose (cartilage that divides the two sides of your nose).  Hold the nostril closed on the opposite side from where you will spray Spray once and gently sniff to pull the medicine into the higher parts of your nose.  Don't sniff too hard as the medicine will drain down the back of your throat instead. Repeat with a second spray on the same side if prescribed. Repeat on the other side of your nose.

## 2021-04-21 NOTE — Progress Notes (Signed)
Belinda Mcintosh    [id number]    03/08/1981  Primary Care Physician:Morrow, Gerlene Burdock, NP  Referring Physician: [name], NP [address] [place],  [place] [address] Reason for Consultation: shortness of breath Date of Consultation: 04/21/2021  Chief complaint:   Chief Complaint  Patient presents with   Consult    Sinus congestion & headaches     HPI: Belinda Mcintosh is a 40 y.o. woman here for shortness of breath after covid. Had mild infection in February 2022.   She also has nasal congestion which she has never had with previous pregnancies. This has been going on since covid infection as well. She has occasional epistaxis.  She is having headaches. Not sure if this is related to pregnancy or not.  Denies cough.   She is from [place]. She usually has a lot of reflux with pregnancy and thinks that is also contributing to her symptoms. She is taking pepcid for this.   Concerned about pulmonary fibrosis. Also wondering if there are any vitamins she can take for her breathing  Social history:  Occupation: housewife Exposures: lives at home with daughter and husband, no pets Smoking history: former social smoker  Social History   Occupational History   Not on file  Tobacco Use   Smoking status: Former    Packs/day: 1.00    Pack years: 0.00    Types: Cigarettes    Quit date: 09/21/2016    Years since quitting: 4.5   Smokeless tobacco: Never  Vaping Use   Vaping Use: Never used  Substance and Sexual Activity   Alcohol use: No   Drug use: No   Sexual activity: Yes    Birth control/protection: Condom    Relevant family history:  Family History  Problem Relation Age of Onset   Hypertension Mother    Diabetes Mother    Breast cancer Mother        Breast Cancer   Cancer Father    Hypertension Father    Diabetes Father    Cancer Sister    Fibromyalgia Sister    Other Sister        spinal stenosis   Alcohol abuse Neg Hx    Arthritis Neg Hx     Asthma Neg Hx    COPD Neg Hx    Depression Neg Hx    Drug abuse Neg Hx    Early death Neg Hx    Heart disease Neg Hx    Hearing loss Neg Hx    Hyperlipidemia Neg Hx    Kidney disease Neg Hx    Learning disabilities Neg Hx    Mental illness Neg Hx    Mental retardation Neg Hx    Miscarriages / Stillbirths Neg Hx    Stroke Neg Hx    Vision loss Neg Hx    Varicose Veins Neg Hx     Past Medical History:  Diagnosis Date   Abdominal pain    Flank pain    History of kidney stones    Low back pain    Palpitations    Umbilical hernia 2018    Past Surgical History:  Procedure Laterality Date   COSMETIC SURGERY     INDUCED ABORTION     LIPOSUCTION     NOSE SURGERY       Physical Exam: Blood pressure 114/60, pulse 82, temperature 97.8 F (36.6 C), height 5\' 1"  (1.549 m), weight 152 lb (  68.9 kg), last menstrual period 11/18/2020, SpO2 98 %, currently breastfeeding. Gen:      No acute distress ENT:  no nasal polyps, mucus membranes moist Lungs:    No increased respiratory effort, symmetric chest wall excursion, clear to auscultation bilaterally, no wheezes or crackles CV:         Regular rate and rhythm; no murmurs, rubs, or gallops.  No pedal edema Abd:      Gravid, + bowel sounds; soft, non-tender; no distension MSK: no acute synovitis of DIP or PIP joints, no mechanics hands.  Skin:      Warm and dry; no rashes Neuro: normal speech, no focal facial asymmetry Psych: alert and oriented x3, normal mood and affect   Data Reviewed/Medical Decision Making:  Independent interpretation of tests: Imaging:  Review of patient's chest xray March 2022 images revealed no acute cardiopulmonary process. The patient's images have been independently reviewed by me.    PFTs: No flowsheet data found.  Labs:  Lab Results  Component Value Date   WBC 7.7 02/22/2021   HGB 12.6 02/22/2021   HCT 37.4 02/22/2021   MCV 91 02/22/2021   PLT 157 02/22/2021     Immunization status:   Immunization History  Administered Date(s) Administered   Influenza,inj,Quad PF,6+ Mos 11/21/2016   Influenza-Unspecified 12/24/2017   PFIZER(Purple Top)SARS-COV-2 Vaccination 01/28/2020, 03/03/2020   Tdap 03/26/2017     I reviewed prior external note(s) from ED visit  I reviewed the result(s) of the labs and imaging as noted above.   I have ordered PFT  Assessment:  Chronic Rhinitis, potentially related to post-covid vs vasomotor rhinitis of pregnancy Shortness of breath, related to pregnancy, less likely asthma. Low suspicion for covid fibroiss.  Plan/Recommendations: Will proceed with full set of PFTs. She would like to hold off on empiric inhaler therapy. Will start flonase 1 spray in each nare BID for rhinitis.    Return to Care: I will see her back as needed and review results of PFTs with her.   Durel Salts, MD Pulmonary and Critical Care Medicine [facility] Office:[phone]  CC: [name], NP

## 2021-04-22 ENCOUNTER — Ambulatory Visit (INDEPENDENT_AMBULATORY_CARE_PROVIDER_SITE_OTHER): Payer: 59 | Admitting: Family Medicine

## 2021-04-22 ENCOUNTER — Other Ambulatory Visit (HOSPITAL_COMMUNITY)
Admission: RE | Admit: 2021-04-22 | Discharge: 2021-04-22 | Disposition: A | Discharge status: Home / Self Care | Payer: 59 | Source: Ambulatory Visit | Attending: Family Medicine | Admitting: Family Medicine

## 2021-04-22 VITALS — BP 98/57 | HR 86 | Wt 152.0 lb

## 2021-04-22 DIAGNOSIS — B373 Candidiasis of vulva and vagina: Secondary | ICD-10-CM

## 2021-04-22 DIAGNOSIS — O09522 Supervision of elderly multigravida, second trimester: Secondary | ICD-10-CM

## 2021-04-22 DIAGNOSIS — K2100 Gastro-esophageal reflux disease with esophagitis, without bleeding: Secondary | ICD-10-CM

## 2021-04-22 DIAGNOSIS — Z124 Encounter for screening for malignant neoplasm of cervix: Secondary | ICD-10-CM | POA: Diagnosis present

## 2021-04-22 DIAGNOSIS — O0990 Supervision of high risk pregnancy, unspecified, unspecified trimester: Secondary | ICD-10-CM

## 2021-04-22 DIAGNOSIS — B3731 Acute candidiasis of vulva and vagina: Secondary | ICD-10-CM

## 2021-04-22 MED ORDER — PANTOPRAZOLE SODIUM 20 MG PO TBEC: 20.0000 mg | DELAYED_RELEASE_TABLET | Freq: Every day | ORAL | 2 refills | Status: DC
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
## 2021-04-22 NOTE — Patient Instructions (Signed)

## 2021-04-24 LAB — CERVICOVAGINAL ANCILLARY ONLY
Bacterial Vaginitis (gardnerella): NEGATIVE
Candida Glabrata: NEGATIVE
Candida Vaginitis: POSITIVE — AB
Comment: NEGATIVE
Comment: NEGATIVE
Comment: NEGATIVE

## 2021-04-25 ENCOUNTER — Telehealth: Payer: Self-pay | Admitting: Internal Medicine

## 2021-04-25 MED ORDER — TERCONAZOLE 0.8 % VA CREA: 1.0000 | TOPICAL_CREAM | Freq: Every day | VAGINAL | 0 refills | Status: DC

## 2021-04-25 NOTE — Telephone Encounter (Signed)
flonase

## 2021-04-25 NOTE — Telephone Encounter (Signed)
Called patient but she did not answer and her VM is not setup.   The OV note states Flonase but the AVS states ipratropium. Flonase was sent in by Lexi on 04/21/21.   ND, please advise if you wanted Flonase or ipratropium. Thanks!

## 2021-04-25 NOTE — Addendum Note (Signed)
Addended by: Reva Bores on: 04/25/2021 08:11 AM   Modules accepted: Orders

## 2021-04-26 LAB — CYTOLOGY - PAP
Comment: NEGATIVE
Diagnosis: NEGATIVE
High risk HPV: NEGATIVE

## 2021-04-26 MED ORDER — IPRATROPIUM BROMIDE 0.03 % NA SOLN: 1.0000 | Freq: Every day | NASAL | 5 refills | Status: DC

## 2021-04-26 NOTE — Telephone Encounter (Signed)
Call returned to patient, confirmed DOB. Confirmed medication and pharmacy. Medication refill sent. Nothing further needed at this time.

## 2021-05-09 ENCOUNTER — Ambulatory Visit: Payer: 59

## 2021-05-09 ENCOUNTER — Ambulatory Visit: Payer: 59 | Attending: Maternal & Fetal Medicine

## 2021-05-13 ENCOUNTER — Telehealth: Payer: Self-pay

## 2021-05-13 NOTE — Telephone Encounter (Signed)
I called [facility] and heard a message " voicemail not set up" . Will send MyChart message. Ivanka Kirshner,RN

## 2021-05-13 NOTE — Telephone Encounter (Signed)
Patient called in and wants to speak with a nurse she is sick and congested and wants to be advised on what she can take   Please call and advise

## 2021-05-19 ENCOUNTER — Other Ambulatory Visit: Payer: Self-pay

## 2021-05-19 DIAGNOSIS — O0990 Supervision of high risk pregnancy, unspecified, unspecified trimester: Secondary | ICD-10-CM

## 2021-05-20 ENCOUNTER — Ambulatory Visit (INDEPENDENT_AMBULATORY_CARE_PROVIDER_SITE_OTHER): Payer: 59 | Admitting: Obstetrics & Gynecology

## 2021-05-20 ENCOUNTER — Ambulatory Visit: Payer: 59 | Attending: Maternal & Fetal Medicine

## 2021-05-20 ENCOUNTER — Other Ambulatory Visit: Payer: 59

## 2021-05-20 ENCOUNTER — Other Ambulatory Visit: Payer: Self-pay | Admitting: *Deleted

## 2021-05-20 ENCOUNTER — Other Ambulatory Visit: Payer: Self-pay

## 2021-05-20 ENCOUNTER — Encounter: Payer: Self-pay | Admitting: *Deleted

## 2021-05-20 ENCOUNTER — Ambulatory Visit: Payer: 59 | Admitting: *Deleted

## 2021-05-20 VITALS — BP 97/72 | HR 76 | Wt 151.6 lb

## 2021-05-20 VITALS — BP 100/54 | HR 88

## 2021-05-20 DIAGNOSIS — Z362 Encounter for other antenatal screening follow-up: Secondary | ICD-10-CM | POA: Insufficient documentation

## 2021-05-20 DIAGNOSIS — O09522 Supervision of elderly multigravida, second trimester: Secondary | ICD-10-CM

## 2021-05-20 DIAGNOSIS — O0990 Supervision of high risk pregnancy, unspecified, unspecified trimester: Secondary | ICD-10-CM

## 2021-05-20 DIAGNOSIS — O09523 Supervision of elderly multigravida, third trimester: Secondary | ICD-10-CM

## 2021-05-20 DIAGNOSIS — Z23 Encounter for immunization: Secondary | ICD-10-CM

## 2021-05-20 MED ORDER — FLUCONAZOLE 150 MG PO TABS: 150.0000 mg | ORAL_TABLET | Freq: Once | ORAL | 0 refills | Status: AC
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
## 2021-05-21 LAB — RPR: RPR Ser Ql: NONREACTIVE

## 2021-05-21 LAB — CBC
Hematocrit: 35.1 % (ref 34.0–46.6)
Hemoglobin: 11.6 g/dL (ref 11.1–15.9)
MCH: 28.9 pg (ref 26.6–33.0)
MCHC: 33 g/dL (ref 31.5–35.7)
MCV: 87 fL (ref 79–97)
Platelets: 131 10*3/uL — ABNORMAL LOW (ref 150–450)
RBC: 4.02 x10E6/uL (ref 3.77–5.28)
RDW: 12.9 % (ref 11.7–15.4)
WBC: 8.2 10*3/uL (ref 3.4–10.8)

## 2021-05-21 LAB — GLUCOSE TOLERANCE, 2 HOURS W/ 1HR
Glucose, 1 hour: 127 mg/dL (ref 65–179)
Glucose, 2 hour: 128 mg/dL (ref 65–152)
Glucose, Fasting: 81 mg/dL (ref 65–91)

## 2021-05-21 LAB — HIV ANTIBODY (ROUTINE TESTING W REFLEX): HIV Screen 4th Generation wRfx: NONREACTIVE

## 2021-06-03 ENCOUNTER — Other Ambulatory Visit (HOSPITAL_COMMUNITY): Payer: 59

## 2021-06-07 ENCOUNTER — Ambulatory Visit: Payer: 59 | Admitting: Internal Medicine

## 2021-06-10 ENCOUNTER — Encounter: Payer: 59 | Admitting: Family Medicine

## 2021-06-16 ENCOUNTER — Encounter: Payer: 59 | Admitting: Obstetrics & Gynecology

## 2021-06-17 ENCOUNTER — Other Ambulatory Visit (HOSPITAL_COMMUNITY)
Admission: RE | Admit: 2021-06-17 | Discharge: 2021-06-17 | Disposition: A | Discharge status: Home / Self Care | Payer: 59 | Source: Ambulatory Visit | Attending: Family Medicine | Admitting: Family Medicine

## 2021-06-17 ENCOUNTER — Other Ambulatory Visit: Payer: Self-pay

## 2021-06-17 ENCOUNTER — Ambulatory Visit (INDEPENDENT_AMBULATORY_CARE_PROVIDER_SITE_OTHER): Payer: 59 | Admitting: Family Medicine

## 2021-06-17 VITALS — BP 120/65 | HR 75 | Wt 158.9 lb

## 2021-06-17 DIAGNOSIS — B3731 Acute candidiasis of vulva and vagina: Secondary | ICD-10-CM

## 2021-06-17 DIAGNOSIS — O0990 Supervision of high risk pregnancy, unspecified, unspecified trimester: Secondary | ICD-10-CM

## 2021-06-17 DIAGNOSIS — O09523 Supervision of elderly multigravida, third trimester: Secondary | ICD-10-CM

## 2021-06-17 DIAGNOSIS — B373 Candidiasis of vulva and vagina: Secondary | ICD-10-CM

## 2021-06-17 MED ORDER — FLUCONAZOLE 150 MG PO TABS: 150.0000 mg | ORAL_TABLET | Freq: Once | ORAL | 0 refills | Status: AC

## 2021-06-17 NOTE — Patient Instructions (Signed)
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
## 2021-06-19 LAB — CERVICOVAGINAL ANCILLARY ONLY
Bacterial Vaginitis (gardnerella): NEGATIVE
Candida Glabrata: NEGATIVE
Candida Vaginitis: POSITIVE — AB
Chlamydia: NEGATIVE
Comment: NEGATIVE
Comment: NEGATIVE
Comment: NEGATIVE
Comment: NEGATIVE
Comment: NEGATIVE
Comment: NORMAL
Neisseria Gonorrhea: NEGATIVE
Trichomonas: NEGATIVE

## 2021-06-24 ENCOUNTER — Ambulatory Visit: Payer: 59 | Attending: Obstetrics

## 2021-06-24 ENCOUNTER — Ambulatory Visit: Payer: 59 | Admitting: *Deleted

## 2021-06-24 ENCOUNTER — Encounter: Payer: Self-pay | Admitting: *Deleted

## 2021-06-24 ENCOUNTER — Other Ambulatory Visit: Payer: Self-pay

## 2021-06-24 VITALS — BP 98/61 | HR 88

## 2021-06-24 DIAGNOSIS — O0990 Supervision of high risk pregnancy, unspecified, unspecified trimester: Secondary | ICD-10-CM | POA: Insufficient documentation

## 2021-06-24 DIAGNOSIS — O99213 Obesity complicating pregnancy, third trimester: Secondary | ICD-10-CM

## 2021-06-24 DIAGNOSIS — O09523 Supervision of elderly multigravida, third trimester: Secondary | ICD-10-CM | POA: Diagnosis present

## 2021-06-24 DIAGNOSIS — Z362 Encounter for other antenatal screening follow-up: Secondary | ICD-10-CM

## 2021-06-24 DIAGNOSIS — E669 Obesity, unspecified: Secondary | ICD-10-CM

## 2021-06-24 DIAGNOSIS — Z3A32 32 weeks gestation of pregnancy: Secondary | ICD-10-CM

## 2021-07-05 ENCOUNTER — Ambulatory Visit (INDEPENDENT_AMBULATORY_CARE_PROVIDER_SITE_OTHER): Payer: 59 | Admitting: Family Medicine

## 2021-07-05 ENCOUNTER — Encounter: Payer: Self-pay | Admitting: Family Medicine

## 2021-07-05 ENCOUNTER — Other Ambulatory Visit: Payer: Self-pay

## 2021-07-05 VITALS — BP 106/65 | HR 96 | Wt 161.0 lb

## 2021-07-05 DIAGNOSIS — O0990 Supervision of high risk pregnancy, unspecified, unspecified trimester: Secondary | ICD-10-CM

## 2021-07-05 DIAGNOSIS — O09523 Supervision of elderly multigravida, third trimester: Secondary | ICD-10-CM

## 2021-07-05 DIAGNOSIS — F419 Anxiety disorder, unspecified: Secondary | ICD-10-CM

## 2021-07-05 MED ORDER — HYDROXYZINE HCL 25 MG PO TABS: 25.0000 mg | ORAL_TABLET | Freq: Three times a day (TID) | ORAL | 2 refills | Status: DC | PRN
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
## 2021-07-05 NOTE — Patient Instructions (Signed)

## 2021-07-07 ENCOUNTER — Ambulatory Visit: Payer: 59 | Admitting: Clinical

## 2021-07-07 DIAGNOSIS — Z5329 Procedure and treatment not carried out because of patient's decision for other reasons: Secondary | ICD-10-CM

## 2021-07-07 DIAGNOSIS — Z91199 Patient's noncompliance with other medical treatment and regimen due to unspecified reason: Secondary | ICD-10-CM

## 2021-07-07 NOTE — BH Specialist Note (Signed)
Pt did not arrive to video visit and did not answer the phone; Unable to leave voice message as mailbox is not set up; left MyChart message for patient.    

## 2021-07-19 ENCOUNTER — Other Ambulatory Visit: Payer: Self-pay

## 2021-07-19 ENCOUNTER — Ambulatory Visit (INDEPENDENT_AMBULATORY_CARE_PROVIDER_SITE_OTHER): Payer: 59 | Admitting: Family Medicine

## 2021-07-19 ENCOUNTER — Other Ambulatory Visit (HOSPITAL_COMMUNITY)
Admission: RE | Admit: 2021-07-19 | Discharge: 2021-07-19 | Disposition: A | Discharge status: Home / Self Care | Payer: 59 | Source: Ambulatory Visit | Attending: Family Medicine | Admitting: Family Medicine

## 2021-07-19 VITALS — BP 102/57 | HR 69 | Wt 158.2 lb

## 2021-07-19 DIAGNOSIS — F419 Anxiety disorder, unspecified: Secondary | ICD-10-CM

## 2021-07-19 DIAGNOSIS — L292 Pruritus vulvae: Secondary | ICD-10-CM

## 2021-07-19 DIAGNOSIS — Z3A35 35 weeks gestation of pregnancy: Secondary | ICD-10-CM

## 2021-07-19 DIAGNOSIS — O0990 Supervision of high risk pregnancy, unspecified, unspecified trimester: Secondary | ICD-10-CM | POA: Insufficient documentation

## 2021-07-19 LAB — OB RESULTS CONSOLE GC/CHLAMYDIA: Gonorrhea: NEGATIVE

## 2021-07-19 MED ORDER — NYSTATIN 100000 UNIT/GM EX CREA: 1.0000 "application " | TOPICAL_CREAM | Freq: Two times a day (BID) | CUTANEOUS | 0 refills | Status: AC
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
## 2021-07-21 ENCOUNTER — Other Ambulatory Visit: Payer: Self-pay | Admitting: Family Medicine

## 2021-07-21 LAB — CERVICOVAGINAL ANCILLARY ONLY
Bacterial Vaginitis (gardnerella): NEGATIVE
Candida Glabrata: NEGATIVE
Candida Vaginitis: POSITIVE — AB
Chlamydia: NEGATIVE
Comment: NEGATIVE
Comment: NEGATIVE
Comment: NEGATIVE
Comment: NEGATIVE
Comment: NEGATIVE
Comment: NORMAL
Neisseria Gonorrhea: NEGATIVE
Trichomonas: NEGATIVE

## 2021-07-21 MED ORDER — FLUCONAZOLE 150 MG PO TABS: 150.0000 mg | ORAL_TABLET | Freq: Every day | ORAL | 0 refills | Status: AC

## 2021-07-23 LAB — CULTURE, BETA STREP (GROUP B ONLY): Strep Gp B Culture: NEGATIVE

## 2021-07-27 ENCOUNTER — Other Ambulatory Visit: Payer: Self-pay

## 2021-07-27 ENCOUNTER — Ambulatory Visit (INDEPENDENT_AMBULATORY_CARE_PROVIDER_SITE_OTHER): Payer: 59 | Admitting: Family Medicine

## 2021-07-27 VITALS — BP 107/62 | HR 88 | Wt 160.1 lb

## 2021-07-27 DIAGNOSIS — K429 Umbilical hernia without obstruction or gangrene: Secondary | ICD-10-CM

## 2021-07-27 DIAGNOSIS — Z23 Encounter for immunization: Secondary | ICD-10-CM | POA: Diagnosis not present

## 2021-07-27 DIAGNOSIS — O09523 Supervision of elderly multigravida, third trimester: Secondary | ICD-10-CM

## 2021-07-27 DIAGNOSIS — O0990 Supervision of high risk pregnancy, unspecified, unspecified trimester: Secondary | ICD-10-CM

## 2021-07-27 NOTE — Patient Instructions (Signed)
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
## 2021-08-04 ENCOUNTER — Encounter: Payer: Self-pay | Admitting: Obstetrics and Gynecology

## 2021-08-04 ENCOUNTER — Other Ambulatory Visit: Payer: Self-pay

## 2021-08-04 ENCOUNTER — Ambulatory Visit (INDEPENDENT_AMBULATORY_CARE_PROVIDER_SITE_OTHER): Payer: 59 | Admitting: Obstetrics and Gynecology

## 2021-08-04 VITALS — BP 113/67 | HR 89 | Wt 162.9 lb

## 2021-08-04 DIAGNOSIS — Z3009 Encounter for other general counseling and advice on contraception: Secondary | ICD-10-CM

## 2021-08-04 DIAGNOSIS — O0990 Supervision of high risk pregnancy, unspecified, unspecified trimester: Secondary | ICD-10-CM

## 2021-08-04 DIAGNOSIS — O09523 Supervision of elderly multigravida, third trimester: Secondary | ICD-10-CM

## 2021-08-04 NOTE — Patient Instructions (Signed)
Vaginal Delivery ?Vaginal delivery means that you give birth by pushing your baby out of your birth canal (vagina). Your health care team will help you before, during, and after vaginal delivery. ?Birth experiences are unique for every woman and every pregnancy, and birth experiences vary depending on where you choose to give birth. ?What are the risks and benefits? ?Generally, this is safe. However, problems may occur, including: ?Bleeding. ?Infection. ?Damage to other structures such as vaginal tearing. ?Allergic reactions to medicines. ?Despite the risks, benefits of vaginal delivery include less risk of bleeding and infection and a shorter recovery time compared to a Cesarean delivery. Cesarean delivery, or C-section, is the surgical delivery of a baby. ?What happens when I arrive at the birth center or hospital? ?Once you are in labor and have been admitted into the hospital or birth center, your health care team may: ?Review your pregnancy history and any concerns that you have. ?Talk with you about your birth plan and discuss pain control options. ?Check your blood pressure, breathing, and heartbeat. ?Assess your baby's heartbeat. ?Monitor your uterus for contractions. ?Check whether your bag of water (amniotic sac) has broken (ruptured). ?Insert an IV into one of your veins. This may be used to give you fluids and medicines. ?Monitoring ?Your health care team may assess your contractions (uterine monitoring) and your baby's heart rate (fetal monitoring). You may need to be monitored: ?Often, but not continuously (intermittently). ?All the time or for long periods at a time (continuously). Continuous monitoring may be needed if: ?You are taking certain medicines, such as medicine to relieve pain or make your contractions stronger. ?You have pregnancy or labor complications. ?Monitoring may be done by: ?Placing a special stethoscope or a handheld monitoring device on your abdomen to check your baby's heartbeat  and to check for contractions. ?Placing monitors on your abdomen (external monitors) to record your baby's heartbeat and the frequency and length of contractions. ?Placing monitors inside your uterus through your vagina (internal monitors) to record your baby's heartbeat and the frequency, length, and strength of your contractions. Depending on the type of monitor, it may remain in your uterus or on your baby's head until birth. ?Telemetry. This is a type of continuous monitoring that can be done with external or internal monitors. Instead of having to stay in bed, you are able to move around. ?Physical exam ?Your health care team may perform frequent physical exams. This may include: ?Checking how and where your baby is positioned in your uterus. ?Checking your cervix to determine: ?Whether it is thinning out (effacing). ?Whether it is opening up (dilating). ?What happens during labor and delivery? ?Normal labor and delivery is divided into the following three stages: ?Stage 1 ?This is the longest stage of labor. ?Throughout this stage, you will feel contractions. Contractions generally feel mild, infrequent, and irregular at first. They get stronger, more frequent, and more regular as you move through this stage. You may have contractions about every 2-3 minutes. ?This stage ends when your cervix is completely dilated to 4 inches (10 cm) and completely effaced. ?Stage 2 ?This stage starts once your cervix is completely effaced and dilated and lasts until the delivery of your baby. ?This is the stage where you will feel an urge to push your baby out of your vagina. ?You may feel stretching and burning pain, especially when the widest part of your baby's head passes through the vaginal opening (crowning). ?Once your baby is delivered, the umbilical cord will be clamped and   cut. Timing of cutting the cord will depend on your wishes, your baby's health, and your health care provider's practices. ?Your baby will be  placed on your bare chest (skin-to-skin contact) in an upright position and covered with a warm blanket. If you are choosing to breastfeed, watch your baby for feeding cues, like rooting or sucking, and help the baby to your breast for his or her first feeding. ?Stage 3 ?This stage starts immediately after the birth of your baby and ends after you deliver the placenta. ?This stage may take anywhere from 5 to 30 minutes. ?After your baby has been delivered, you will feel contractions as your body expels the placenta. These contractions also help your uterus get smaller and reduce bleeding. ?What can I expect after labor and delivery? ?After labor is over, you and your baby will be assessed closely until you are ready to go home. Your health care team will teach you how to care for yourself and your baby. ?You and your baby may be encouraged to stay in the same room (rooming in) during your hospital stay. This will help promote early bonding and successful breastfeeding. ?Your uterus will be checked and massaged regularly (fundal massage). ?You may continue to receive fluids and medicines through an IV. ?You will have some soreness and pain in your abdomen, vagina, and the area of skin between your vaginal opening and your anus (perineum). ?If an incision was made near your vagina (episiotomy) or if you had some vaginal tearing during delivery, cold compresses may be placed on your episiotomy or your tear. This helps to reduce pain and swelling. ?It is normal to have vaginal bleeding after delivery. Wear a sanitary pad for vaginal bleeding and discharge. ?Summary ?Vaginal delivery means that you will give birth by pushing your baby out of your birth canal (vagina). ?Your health care team will monitor you and your baby throughout the stages of labor. ?After you deliver your baby, your health care team will continue to assess you and your baby to ensure you are both recovering as expected after delivery. ?This  information is not intended to replace advice given to you by your health care provider. Make sure you discuss any questions you have with your health care provider. ?Document Revised: 09/27/2020 Document Reviewed: 09/27/2020 ?Elsevier Patient Education ? 2022 [employer] ? ?
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
## 2021-08-12 ENCOUNTER — Ambulatory Visit (INDEPENDENT_AMBULATORY_CARE_PROVIDER_SITE_OTHER): Payer: 59 | Admitting: Obstetrics and Gynecology

## 2021-08-12 ENCOUNTER — Other Ambulatory Visit: Payer: Self-pay

## 2021-08-12 VITALS — BP 106/69 | HR 89 | Wt 160.8 lb

## 2021-08-12 DIAGNOSIS — Z3009 Encounter for other general counseling and advice on contraception: Secondary | ICD-10-CM

## 2021-08-12 DIAGNOSIS — O0990 Supervision of high risk pregnancy, unspecified, unspecified trimester: Secondary | ICD-10-CM

## 2021-08-12 DIAGNOSIS — O09523 Supervision of elderly multigravida, third trimester: Secondary | ICD-10-CM
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
## 2021-08-16 ENCOUNTER — Other Ambulatory Visit: Payer: Self-pay | Admitting: Advanced Practice Midwife

## 2021-08-17 ENCOUNTER — Telehealth (HOSPITAL_COMMUNITY): Payer: Self-pay | Admitting: *Deleted

## 2021-08-17 NOTE — Telephone Encounter (Signed)
Preadmission screen  

## 2021-08-19 ENCOUNTER — Ambulatory Visit (INDEPENDENT_AMBULATORY_CARE_PROVIDER_SITE_OTHER): Payer: 59

## 2021-08-19 ENCOUNTER — Other Ambulatory Visit: Payer: Self-pay

## 2021-08-19 ENCOUNTER — Telehealth (HOSPITAL_COMMUNITY): Payer: Self-pay | Admitting: *Deleted

## 2021-08-19 ENCOUNTER — Ambulatory Visit: Payer: 59 | Admitting: *Deleted

## 2021-08-19 VITALS — BP 116/58 | HR 79 | Wt 165.5 lb

## 2021-08-19 DIAGNOSIS — O480 Post-term pregnancy: Secondary | ICD-10-CM

## 2021-08-19 DIAGNOSIS — Z3A40 40 weeks gestation of pregnancy: Secondary | ICD-10-CM | POA: Diagnosis not present

## 2021-08-19 NOTE — Telephone Encounter (Signed)
Preadmission screen  

## 2021-08-19 NOTE — Progress Notes (Signed)
Pt informed that the ultrasound is considered a limited OB ultrasound and is not intended to be a complete ultrasound exam.  Patient also informed that the ultrasound is not being completed with the intent of assessing for fetal or placental anomalies or any pelvic abnormalities.  Explained that the purpose of today's ultrasound is to assess for presentation, BPP and amniotic fluid volume.  Patient acknowledges the purpose of the exam and the limitations of the study.    IOL scheduled on 10/12.

## 2021-08-20 ENCOUNTER — Inpatient Hospital Stay (HOSPITAL_COMMUNITY): Payer: 59 | Admitting: Anesthesiology

## 2021-08-20 ENCOUNTER — Other Ambulatory Visit: Payer: Self-pay

## 2021-08-20 ENCOUNTER — Inpatient Hospital Stay (HOSPITAL_COMMUNITY)
Admission: AD | Admit: 2021-08-20 | Discharge: 2021-08-23 | DRG: 807 | Disposition: A | Discharge status: Home / Self Care | Payer: 59 | Attending: Obstetrics & Gynecology | Admitting: Obstetrics & Gynecology

## 2021-08-20 ENCOUNTER — Encounter (HOSPITAL_COMMUNITY): Payer: Self-pay | Admitting: Obstetrics and Gynecology

## 2021-08-20 DIAGNOSIS — Z880 Allergy status to penicillin: Secondary | ICD-10-CM

## 2021-08-20 DIAGNOSIS — Z87891 Personal history of nicotine dependence: Secondary | ICD-10-CM | POA: Diagnosis not present

## 2021-08-20 DIAGNOSIS — O26893 Other specified pregnancy related conditions, third trimester: Secondary | ICD-10-CM | POA: Diagnosis present

## 2021-08-20 DIAGNOSIS — Z8616 Personal history of COVID-19: Secondary | ICD-10-CM | POA: Diagnosis not present

## 2021-08-20 DIAGNOSIS — K2100 Gastro-esophageal reflux disease with esophagitis, without bleeding: Secondary | ICD-10-CM | POA: Diagnosis present

## 2021-08-20 DIAGNOSIS — O09529 Supervision of elderly multigravida, unspecified trimester: Secondary | ICD-10-CM

## 2021-08-20 DIAGNOSIS — O9912 Other diseases of the blood and blood-forming organs and certain disorders involving the immune mechanism complicating childbirth: Secondary | ICD-10-CM | POA: Diagnosis present

## 2021-08-20 DIAGNOSIS — Z20822 Contact with and (suspected) exposure to covid-19: Secondary | ICD-10-CM | POA: Diagnosis present

## 2021-08-20 DIAGNOSIS — Z3A40 40 weeks gestation of pregnancy: Secondary | ICD-10-CM

## 2021-08-20 DIAGNOSIS — F419 Anxiety disorder, unspecified: Secondary | ICD-10-CM | POA: Diagnosis present

## 2021-08-20 DIAGNOSIS — O9962 Diseases of the digestive system complicating childbirth: Secondary | ICD-10-CM | POA: Diagnosis present

## 2021-08-20 DIAGNOSIS — D696 Thrombocytopenia, unspecified: Secondary | ICD-10-CM | POA: Diagnosis present

## 2021-08-20 DIAGNOSIS — Z3009 Encounter for other general counseling and advice on contraception: Secondary | ICD-10-CM | POA: Diagnosis present

## 2021-08-20 LAB — CBC
HCT: 32.7 % — ABNORMAL LOW (ref 36.0–46.0)
Hemoglobin: 10.3 g/dL — ABNORMAL LOW (ref 12.0–15.0)
MCH: 25.9 pg — ABNORMAL LOW (ref 26.0–34.0)
MCHC: 31.5 g/dL (ref 30.0–36.0)
MCV: 82.2 fL (ref 80.0–100.0)
Platelets: 130 10*3/uL — ABNORMAL LOW (ref 150–400)
RBC: 3.98 MIL/uL (ref 3.87–5.11)
RDW: 14.7 % (ref 11.5–15.5)
WBC: 8.4 10*3/uL (ref 4.0–10.5)
nRBC: 0 % (ref 0.0–0.2)

## 2021-08-20 LAB — RESP PANEL BY RT-PCR (FLU A&B, COVID) ARPGX2
Influenza A by PCR: NEGATIVE
Influenza B by PCR: NEGATIVE
SARS Coronavirus 2 by RT PCR: NEGATIVE

## 2021-08-20 LAB — TYPE AND SCREEN
ABO/RH(D): O POS
Antibody Screen: NEGATIVE

## 2021-08-20 MED ORDER — EPHEDRINE 5 MG/ML INJ: 10.0000 mg | INTRAVENOUS | Status: DC | PRN

## 2021-08-20 MED ORDER — OXYTOCIN BOLUS FROM INFUSION
333.0000 mL | Freq: Once | INTRAVENOUS | Status: AC
  Administered 2021-08-21: 333 mL via INTRAVENOUS

## 2021-08-20 MED ORDER — LACTATED RINGERS IV SOLN: INTRAVENOUS | Status: DC

## 2021-08-20 MED ORDER — LIDOCAINE HCL (PF) 1 % IJ SOLN: 30.0000 mL | INTRAMUSCULAR | Status: DC | PRN

## 2021-08-20 MED ORDER — ACETAMINOPHEN 325 MG PO TABS: 650.0000 mg | ORAL_TABLET | ORAL | Status: DC | PRN

## 2021-08-20 MED ORDER — FENTANYL CITRATE (PF) 100 MCG/2ML IJ SOLN
100.0000 ug | INTRAMUSCULAR | Status: DC | PRN
  Administered 2021-08-21 (×2): 100 ug via INTRAVENOUS
  Filled 2021-08-20 (×2): qty 2

## 2021-08-20 MED ORDER — OXYCODONE-ACETAMINOPHEN 5-325 MG PO TABS: 1.0000 | ORAL_TABLET | ORAL | Status: DC | PRN

## 2021-08-20 MED ORDER — OXYCODONE-ACETAMINOPHEN 5-325 MG PO TABS
2.0000 | ORAL_TABLET | ORAL | Status: DC | PRN
Start: 2021-08-20 — End: 2021-08-21

## 2021-08-20 MED ORDER — FENTANYL-BUPIVACAINE-NACL 0.5-0.125-0.9 MG/250ML-% EP SOLN
12.0000 mL/h | EPIDURAL | Status: DC | PRN
  Administered 2021-08-20: 12 mL/h via EPIDURAL
  Filled 2021-08-20 (×2): qty 250

## 2021-08-20 MED ORDER — PHENYLEPHRINE 40 MCG/ML (10ML) SYRINGE FOR IV PUSH (FOR BLOOD PRESSURE SUPPORT)
80.0000 ug | PREFILLED_SYRINGE | INTRAVENOUS | Status: DC | PRN
  Administered 2021-08-21: 80 ug via INTRAVENOUS
  Filled 2021-08-20: qty 10

## 2021-08-20 MED ORDER — ONDANSETRON HCL 4 MG/2ML IJ SOLN: 4.0000 mg | Freq: Four times a day (QID) | INTRAMUSCULAR | Status: DC | PRN

## 2021-08-20 MED ORDER — OXYTOCIN-SODIUM CHLORIDE 30-0.9 UT/500ML-% IV SOLN
2.5000 [IU]/h | INTRAVENOUS | Status: DC
  Administered 2021-08-21: 2.5 [IU]/h via INTRAVENOUS
  Filled 2021-08-20: qty 500

## 2021-08-20 MED ORDER — PHENYLEPHRINE 40 MCG/ML (10ML) SYRINGE FOR IV PUSH (FOR BLOOD PRESSURE SUPPORT)
80.0000 ug | PREFILLED_SYRINGE | INTRAVENOUS | Status: DC | PRN
Start: 2021-08-20 — End: 2021-08-21

## 2021-08-20 MED ORDER — BUPIVACAINE HCL (PF) 0.25 % IJ SOLN
INTRAMUSCULAR | Status: DC | PRN
  Administered 2021-08-20: 10 mL via EPIDURAL

## 2021-08-20 MED ORDER — DIPHENHYDRAMINE HCL 50 MG/ML IJ SOLN
12.5000 mg | INTRAMUSCULAR | Status: DC | PRN
Start: 2021-08-20 — End: 2021-08-21

## 2021-08-20 MED ORDER — FENTANYL CITRATE (PF) 100 MCG/2ML IJ SOLN
INTRAMUSCULAR | Status: AC
  Administered 2021-08-20: 100 ug via INTRAVENOUS
  Filled 2021-08-20: qty 2

## 2021-08-20 MED ORDER — LACTATED RINGERS IV SOLN: 500.0000 mL | Freq: Once | INTRAVENOUS | Status: DC

## 2021-08-20 MED ORDER — LIDOCAINE HCL (PF) 1 % IJ SOLN
INTRAMUSCULAR | Status: DC | PRN
  Administered 2021-08-20: 11 mL via EPIDURAL

## 2021-08-20 MED ORDER — LACTATED RINGERS IV SOLN: 500.0000 mL | INTRAVENOUS | Status: DC | PRN

## 2021-08-20 MED ORDER — SOD CITRATE-CITRIC ACID 500-334 MG/5ML PO SOLN: 30.0000 mL | ORAL | Status: DC | PRN

## 2021-08-20 NOTE — Anesthesia Preprocedure Evaluation (Signed)
Anesthesia Evaluation  Patient identified by MRN, date of birth, ID band Patient awake    Reviewed: Allergy & Precautions, H&P , NPO status , Patient's Chart, lab work & pertinent test results  Airway Mallampati: II   Neck ROM: full    Dental   Pulmonary former smoker,    breath sounds clear to auscultation       Cardiovascular negative cardio ROS   Rhythm:regular Rate:Normal     Neuro/Psych    GI/Hepatic   Endo/Other    Renal/GU      Musculoskeletal   Abdominal   Peds  Hematology   Anesthesia Other Findings   Reproductive/Obstetrics (+) Pregnancy                             Anesthesia Physical  Anesthesia Plan  ASA: 2  Anesthesia Plan: Epidural   Post-op Pain Management:    Induction: Intravenous  PONV Risk Score and Plan: 2 and Treatment may vary due to age or medical condition  Airway Management Planned: Natural Airway  Additional Equipment:   Intra-op Plan:   Post-operative Plan:   Informed Consent: I have reviewed the patients History and Physical, chart, labs and discussed the procedure including the risks, benefits and alternatives for the proposed anesthesia with the patient or authorized representative who has indicated his/her understanding and acceptance.       Plan Discussed with: Anesthesiologist  Anesthesia Plan Comments:         Anesthesia Quick Evaluation

## 2021-08-20 NOTE — H&P (Addendum)
HPI: Belinda Mcintosh is a 40 y.o. year old G58P1021 female at [redacted]w[redacted]d weeks gestation who presents to MAU reporting Labor. 4 cm dilated in MAU. Denies LOF. Scant VB. Nml fetal mvmt.   UA 06/24/21: Est. FW:    1941   gm     4 lb 4 oz     39  %  Nursing Staff Provider  Office Location  [facility] Dating  LMP  Language  English Anatomy US  WNL  Flu Vaccine   Genetic/Carrier Screen  NIPS:  Low risk Female  AFP:   Negative Horizon: Negative   TDaP Vaccine   05/20/21 Hgb A1C or  GTT Early normal 78, 153, 119 Third trimester : normal  COVID Vaccine    LAB RESULTS   Rhogam  NA Blood Type O/Positive/-- (04/12 0943)   Baby Feeding Plan Bottle  Antibody Negative (04/12 0943)  Contraception Depo Rubella 3.55 (04/12 0943)  Circumcision Yes RPR Non Reactive (04/12 0943)   Pediatrician  [facility] HBsAg Negative (04/12 0943)   Support Person FOB HCVAb neg  Prenatal Classes  HIV Non Reactive (04/12 0943)     BTL Consent  GBS   (For PCN allergy, check sensitivities)   VBAC Consent  Pap        BP Cuff Own One Waterbirth  [ ]  Class [ ]  Consent [ ]  CNM visit    Induction  [ ]  Orders Entered [ ] Foley Y/N   OB History     Gravida  4   Para  1   Term  1   Preterm  0   AB  2   Living  1      SAB  0   IAB  1   Ectopic  0   Multiple  0   Live Births  1          Past Medical History:  Diagnosis Date   Abdominal pain    Flank pain    History of kidney stones    Low back pain    Palpitations    Umbilical hernia 2018   Past Surgical History:  Procedure Laterality Date   COSMETIC SURGERY     INDUCED ABORTION     LIPOSUCTION     NOSE SURGERY     Family History: family history includes Breast cancer in her mother; Cancer in her father and sister; Diabetes in her father and mother; Fibromyalgia in her sister; Hypertension in her father and mother; Other in her sister. Social History:  reports that she quit smoking about 4 years ago. Her smoking use included cigarettes. She  smoked an average of 1 pack per day. She has never used smokeless tobacco. She reports that she does not drink alcohol and does not use drugs.     Maternal Diabetes: No Genetic Screening: Normal Maternal Ultrasounds/Referrals: Normal Fetal Ultrasounds or other Referrals:  None Maternal Substance Abuse:  No Significant Maternal Medications:  None Significant Maternal Lab Results:  Group B Strep negative Other Comments:  None  Review of Systems  Constitutional:  Negative for chills and fever.  Eyes:  Negative for visual disturbance.  Gastrointestinal:  Positive for abdominal pain (contractions only). Negative for nausea and vomiting.  Genitourinary:  Positive for vaginal bleeding (scant). Negative for vaginal discharge.       No LOF  Neurological:  Negative for headaches.  Maternal Medical History:  Reason for admission: Contractions.  Nausea.  Contractions: Frequency: regular.   Perceived severity is  strong.   Fetal activity: Perceived fetal activity is normal.   Prenatal complications: Thrombocytopenia (mild 130).   No PIH.   Prenatal Complications - Diabetes: none.  Dilation: 6 Effacement (%): 80 Station: -2 Exam by::  Dorathy Kinsman, CNM) Blood pressure 112/68, pulse 96, temperature 98.7 F (37.1 C), resp. rate 18, last menstrual period 11/18/2020, currently breastfeeding. Maternal Exam:  Uterine Assessment: Contraction strength is firm.  Contraction frequency is regular.  Abdomen: Patient reports no abdominal tenderness. Estimated fetal weight is 7 lb.   Fetal presentation: vertex Introitus: Normal vulva. Normal vagina.  Pelvis: adequate for delivery.   Cervix: Cervix evaluated by digital exam.     Fetal Exam Fetal Monitor Review: Baseline rate: 135.  Variability: moderate (6-25 bpm).   Pattern: accelerations present and no decelerations.   Fetal State Assessment: Category I - tracings are normal.  Physical Exam Constitutional:      General: She is in acute  distress.     Appearance: She is normal weight. She is not ill-appearing or toxic-appearing.  Eyes:     Conjunctiva/sclera: Conjunctivae normal.  Cardiovascular:     Rate and Rhythm: Normal rate.  Pulmonary:     Effort: Pulmonary effort is normal. No respiratory distress.     Breath sounds: Normal breath sounds.  Abdominal:     General: There is no distension.     Palpations: Abdomen is soft.     Tenderness: There is no abdominal tenderness.     Comments: Umbilical hernia  Genitourinary:    General: Normal vulva.  Musculoskeletal:     Right lower leg: No edema.     Left lower leg: No edema.  Skin:    General: Skin is warm and dry.     Findings: No rash.  Neurological:     General: No focal deficit present.     Mental Status: She is alert and oriented to person, place, and time.  Psychiatric:        Mood and Affect: Mood normal.    Prenatal labs: ABO, Rh: --/--/O POS (10/08 1955) Antibody: NEG (10/08 1955) Rubella: 3.55 (04/12 0943) RPR: Non Reactive (07/08 0841)  HBsAg: Negative (04/12 0943)  HIV: Non Reactive (07/08 0841)  GBS: Negative/-- (09/06 1102)  CBC    Component Value Date/Time   WBC 8.4 08/20/2021 1955   RBC 3.98 08/20/2021 1955   HGB 10.3 (L) 08/20/2021 1955   HGB 11.6 05/20/2021 0841   HCT 32.7 (L) 08/20/2021 1955   HCT 35.1 05/20/2021 0841   PLT 130 (L) 08/20/2021 1955   PLT 131 (L) 05/20/2021 0841   MCV 82.2 08/20/2021 1955   MCV 87 05/20/2021 0841   MCH 25.9 (L) 08/20/2021 1955   MCHC 31.5 08/20/2021 1955   RDW 14.7 08/20/2021 1955   RDW 12.9 05/20/2021 0841   LYMPHSABS 1.6 02/22/2021 0943   MONOABS 0.4 01/31/2021 0258   EOSABS 0.1 02/22/2021 0943   BASOSABS 0.0 02/22/2021 0943     Assessment: 1. Labor: Active 2. Fetal Wellbeing: Category I  3. Pain Control: Fentanyl, requesting epidural 4. GBS: neg 5. 40.3 week IUP 6. Mild Thrombocytopenia. Nml BPs.   Plan:  1. Admit to BS per consult with MD 2. Routine L&D orders 3.  Analgesia/anesthesia PRN  4. Plans interval BTL. Will discuss possibly coordinating w/ planned hernia repair.  5. Check Plt PP.   Dorathy Kinsman 08/20/2021, 9:03 PM

## 2021-08-20 NOTE — Anesthesia Procedure Notes (Signed)
Epidural Patient location during procedure: OB Start time: 08/20/2021 9:13 PM End time: 08/20/2021 9:28 PM  Staffing Anesthesiologist: Lowella Curb, MD Performed: anesthesiologist   Preanesthetic Checklist Completed: patient identified, IV checked, site marked, risks and benefits discussed, surgical consent, monitors and equipment checked, pre-op evaluation and timeout performed  Epidural Patient position: sitting Prep: ChloraPrep Patient monitoring: heart rate, cardiac monitor, continuous pulse ox and blood pressure Approach: midline Injection technique: LOR air  Needle:  Needle type: Tuohy  Needle gauge: 17 G Needle length: 9 cm Needle insertion depth: 6 cm Catheter type: closed end flexible Catheter size: 20 Guage Catheter at skin depth: 10 cm Test dose: negative  Assessment Events: blood not aspirated, injection not painful, no injection resistance, no paresthesia and negative IV test  Additional Notes Reason for block:procedure for pain

## 2021-08-20 NOTE — Progress Notes (Signed)
Belinda Mcintosh is a 40 y.o. [phone] at [redacted]w[redacted]d.  Subjective: Still having severe pain after Epidural placement. Asking for C/S due to pain.   Objective: BP 116/65   Pulse (!) 106   Temp 98.2 F (36.8 C)   Resp 16   LMP 11/18/2020 (Approximate)   SpO2 98%    FHT:  FHR: 130 bpm, variability: mod,  accelerations:  15x15,  decelerations:  none UC:   Q 2-3  minutes, strong Dilation: 7 Effacement (%): 80 Station: -1 Presentation: Vertex Exam by:: Dorathy Kinsman, CNM  Labs: Results for orders placed or performed during the hospital encounter of 08/20/21 (from the past 24 hour(s))  Resp Panel by RT-PCR (Flu A&B, Covid) Nasopharyngeal Swab     Status: None   Collection Time: 08/20/21  7:28 PM   Specimen: Nasopharyngeal Swab; Nasopharyngeal(NP) swabs in vial transport medium  Result Value Ref Range   SARS Coronavirus 2 by RT PCR NEGATIVE NEGATIVE   Influenza A by PCR NEGATIVE NEGATIVE   Influenza B by PCR NEGATIVE NEGATIVE  CBC     Status: Abnormal   Collection Time: 08/20/21  7:55 PM  Result Value Ref Range   WBC 8.4 4.0 - 10.5 K/uL   RBC 3.98 3.87 - 5.11 MIL/uL   Hemoglobin 10.3 (L) 12.0 - 15.0 g/dL   HCT 02.7 (L) 25.3 - 66.4 %   MCV 82.2 80.0 - 100.0 fL   MCH 25.9 (L) 26.0 - 34.0 pg   MCHC 31.5 30.0 - 36.0 g/dL   RDW 40.3 47.4 - 25.9 %   Platelets 130 (L) 150 - 400 K/uL   nRBC 0.0 0.0 - 0.2 %  Type and screen     Status: None   Collection Time: 08/20/21  7:55 PM  Result Value Ref Range   ABO/RH(D) O POS    Antibody Screen NEG    Sample Expiration      08/23/2021,2359 Performed at [facility], [address], [place], [place] [address]     Assessment / Plan: [redacted]w[redacted]d week IUP Labor: Transition Fetal Wellbeing:  Category I Pain Control:  Epidural not working. Will hit PCA second time. If inadequate relief, will call anesthesia to redose epidural.  Anticipated MOD:  SVD  Katrinka Blazing [place], CNM 08/20/2021 11:44 PM

## 2021-08-21 ENCOUNTER — Encounter (HOSPITAL_COMMUNITY): Payer: Self-pay | Admitting: Obstetrics and Gynecology

## 2021-08-21 DIAGNOSIS — Z3A40 40 weeks gestation of pregnancy: Secondary | ICD-10-CM

## 2021-08-21 LAB — RPR: RPR Ser Ql: NONREACTIVE

## 2021-08-21 MED ORDER — BUTORPHANOL TARTRATE 1 MG/ML IJ SOLN: 2.0000 mg | Freq: Once | INTRAMUSCULAR | Status: DC

## 2021-08-21 MED ORDER — DIBUCAINE (PERIANAL) 1 % EX OINT: 1.0000 "application " | TOPICAL_OINTMENT | CUTANEOUS | Status: DC | PRN

## 2021-08-21 MED ORDER — TETANUS-DIPHTH-ACELL PERTUSSIS 5-2.5-18.5 LF-MCG/0.5 IM SUSY: 0.5000 mL | PREFILLED_SYRINGE | Freq: Once | INTRAMUSCULAR | Status: DC

## 2021-08-21 MED ORDER — BENZOCAINE-MENTHOL 20-0.5 % EX AERO
1.0000 "application " | INHALATION_SPRAY | CUTANEOUS | Status: DC | PRN
  Administered 2021-08-21: 1 via TOPICAL
  Filled 2021-08-21: qty 56

## 2021-08-21 MED ORDER — ONDANSETRON HCL 4 MG/2ML IJ SOLN: 4.0000 mg | INTRAMUSCULAR | Status: DC | PRN

## 2021-08-21 MED ORDER — SODIUM CHLORIDE 0.9 % IV SOLN
12.5000 mg | Freq: Once | INTRAVENOUS | Status: DC
  Filled 2021-08-21: qty 0.5

## 2021-08-21 MED ORDER — DIPHENHYDRAMINE HCL 25 MG PO CAPS: 25.0000 mg | ORAL_CAPSULE | Freq: Four times a day (QID) | ORAL | Status: DC | PRN

## 2021-08-21 MED ORDER — OXYTOCIN-SODIUM CHLORIDE 30-0.9 UT/500ML-% IV SOLN
1.0000 m[IU]/min | INTRAVENOUS | Status: DC
  Administered 2021-08-21: 2 m[IU]/min via INTRAVENOUS

## 2021-08-21 MED ORDER — TERBUTALINE SULFATE 1 MG/ML IJ SOLN: 0.2500 mg | Freq: Once | INTRAMUSCULAR | Status: DC | PRN

## 2021-08-21 MED ORDER — ACETAMINOPHEN 325 MG PO TABS
650.0000 mg | ORAL_TABLET | ORAL | Status: DC | PRN
  Administered 2021-08-22 – 2021-08-23 (×3): 650 mg via ORAL
  Filled 2021-08-21 (×3): qty 2

## 2021-08-21 MED ORDER — ZOLPIDEM TARTRATE 5 MG PO TABS: 5.0000 mg | ORAL_TABLET | Freq: Every evening | ORAL | Status: DC | PRN

## 2021-08-21 MED ORDER — ONDANSETRON HCL 4 MG PO TABS: 4.0000 mg | ORAL_TABLET | ORAL | Status: DC | PRN

## 2021-08-21 MED ORDER — COCONUT OIL OIL
1.0000 "application " | TOPICAL_OIL | Status: DC | PRN
  Administered 2021-08-21: 1 via TOPICAL

## 2021-08-21 MED ORDER — PRENATAL MULTIVITAMIN CH
1.0000 | ORAL_TABLET | Freq: Every day | ORAL | Status: DC
  Administered 2021-08-22 – 2021-08-23 (×2): 1 via ORAL
  Filled 2021-08-21 (×2): qty 1

## 2021-08-21 MED ORDER — SENNOSIDES-DOCUSATE SODIUM 8.6-50 MG PO TABS
2.0000 | ORAL_TABLET | Freq: Every day | ORAL | Status: DC
  Administered 2021-08-22: 2 via ORAL
  Filled 2021-08-21: qty 2

## 2021-08-21 MED ORDER — NALBUPHINE HCL 10 MG/ML IJ SOLN
10.0000 mg | INTRAMUSCULAR | Status: DC | PRN
Start: 2021-08-21 — End: 2021-08-21

## 2021-08-21 MED ORDER — SIMETHICONE 80 MG PO CHEW: 80.0000 mg | CHEWABLE_TABLET | ORAL | Status: DC | PRN

## 2021-08-21 MED ORDER — WITCH HAZEL-GLYCERIN EX PADS: 1.0000 "application " | MEDICATED_PAD | CUTANEOUS | Status: DC | PRN

## 2021-08-21 MED ORDER — OXYTOCIN-SODIUM CHLORIDE 30-0.9 UT/500ML-% IV SOLN: 1.0000 m[IU]/min | INTRAVENOUS | Status: DC

## 2021-08-21 MED ORDER — IBUPROFEN 600 MG PO TABS
600.0000 mg | ORAL_TABLET | Freq: Four times a day (QID) | ORAL | Status: DC
  Administered 2021-08-21 – 2021-08-23 (×7): 600 mg via ORAL
  Filled 2021-08-21 (×8): qty 1

## 2021-08-21 NOTE — Lactation Note (Signed)
This note was copied from a baby's chart. Lactation Consultation Note  Patient Name: Belinda Mcintosh DPOEU'M Date: 08/21/2021 Reason for consult: Initial assessment;Mother's request;Difficult latch;Term;Breastfeeding assistance Age:40 hours  Mom states may feel more comfortable with pumping and bottle feeding. LC did assist with latching with 24 NS with signs of milk transfer. LC end feeding since Mom required RN assistance to get to the bathroom and was complaining of pain.   Mom set up on DEBP sized 24 and 27 flange. Mom states low setting felt better with pump, will need further assessment since time cut short since she need RN assistance.   Plan 1 To feed based on cues 8-12x 24 hr period. Mom to offer breast with help of 24 NS looking for signs of milk transfer.  2. If unable to latch, Mom to hand express and offer EBM via spoon.  3 DEBP q 3 hrs for 15 min  4. I and O sheet reviewed.  All questions answered at the end of the visit.  Mom has DEBP at home.   Maternal Data Has patient been taught Hand Expression?: Yes Does the patient have breastfeeding experience prior to this delivery?: Yes How long did the patient breastfeed?: Per mom, daughter had latch difficulties so she pumped 6 to 7 months  Feeding Mother's Current Feeding Choice: Breast Milk  LATCH Score Latch: Repeated attempts needed to sustain latch, nipple held in mouth throughout feeding, stimulation needed to elicit sucking reflex.  Audible Swallowing: Spontaneous and intermittent  Type of Nipple: Flat (nipples more short shafted needing extension of 24 NS provided in L and D)  Comfort (Breast/Nipple): Filling, red/small blisters or bruises, mild/mod discomfort  Hold (Positioning): Assistance needed to correctly position infant at breast and maintain latch.  LATCH Score: 6   Lactation Tools Discussed/Used Tools: Nipple Shields;Pump;Flanges Nipple shield size: 24 Flange Size: 24;27 Breast pump type:  Double-Electric Breast Pump Pump Education: Setup, frequency, and cleaning;Milk Storage Reason for Pumping: increase stimulation Pumping frequency: every 3 hrs for  Interventions Interventions: Breast feeding basics reviewed;Support pillows;Education;Assisted with latch;Position options;Skin to skin;Expressed milk;Breast massage;Hand express;LC Services brochure;Infant Driven Feeding Algorithm education;DEBP;Breast compression;Adjust position  Discharge [facility] Program: No  Consult Status Consult Status: Follow-up Date: 08/22/21 Follow-up type: In-patient    Harlym Gehling  Nicholson-Springer 08/21/2021, 8:06 PM

## 2021-08-21 NOTE — Progress Notes (Signed)
Belia Febo is a 40 y.o. [id number] at [redacted]w[redacted]d   Subjective: Spouse requested physician evaluation. Pt remains in severe pain. She requests c-section. We discussed c-section would still need anesthesia and for that we would recommend neuraxial anesthesia. Would advise trying repeat epidural. After discussing, she would like to retry epidural.   Objective: BP 135/68   Pulse (!) 114   Temp 98 F (36.7 C) (Oral)   Resp 18   LMP 11/18/2020 (Approximate)   SpO2 98%  No intake/output data recorded. No intake/output data recorded.  FHT:  FHR: 130 bpm, variability: moderate,  accelerations:  Present,  decelerations:  Absent UC:   regular, every 2-3 minutes SVE:   Dilation: 8.5 Effacement (%): 100 Station: 0 Exam by:: Edward Jolly, RN - unchanged by my exam as well.   Labs: Lab Results  Component Value Date   WBC 8.4 08/20/2021   HGB 10.3 (L) 08/20/2021   HCT 32.7 (L) 08/20/2021   MCV 82.2 08/20/2021   PLT 130 (L) 08/20/2021    Assessment / Plan: Protracted active phase but pt in significant pain despite multiple attempts at PCEA, redose by anesthesia and use of fentanyl.   Labor:  Protracted active labor, but due to pain, would not augment until we have her more comfortable.  We may augment vs IUPC. Will first see her comfort level with new epidural but initial response is excellent.  Preeclampsia:  no signs or symptoms of toxicity Fetal Wellbeing:  Category I Pain Control:   Dr. Hyacinth Meeker came in and repeated the epidural after patient consented again with him to epidural being re-done. I stayed with the patient through several contractions and her pain relief was markedly better.  I/D:   GBS Neg Anticipated MOD:  NSVD  Milas Hock 08/21/2021, 4:20 AM

## 2021-08-21 NOTE — Progress Notes (Signed)
Belinda Mcintosh is a 40 y.o. [phone] at [redacted]w[redacted]d.  Subjective: Severe pain. No relief from redosing of epidural. Declines having repidural redone. Not coping well. No pelvic pressure or urge to push.   Objective: BP (!) 127/98   Pulse (!) 105   Temp 98.2 F (36.8 C)   Resp 15   LMP 11/18/2020 (Approximate)   SpO2 98%    FHT:  FHR: 135 bpm, variability: mod,  accelerations:  15x15,  decelerations:  none UC:   Q 2-4 minutes, mod- strong Dilation: 8.5 Effacement (%): 80 Station: -1 Presentation: Vertex Exam by:: Dorathy Kinsman, CNM AROM mod amount of clear fluid  Labs: NA  Assessment / Plan: [redacted]w[redacted]d week IUP Labor: Transition, progressing well. Now ROM'd.  Fetal Wellbeing:  Category I Pain Control:  Will give IV pain meds. OK per Anesthesiologist.  Anticipated MOD:  SVD  Katrinka Blazing [place], CNM 08/21/2021 12:54 AM

## 2021-08-21 NOTE — Lactation Note (Signed)
This note was copied from a baby's chart. Lactation Consultation Note  Patient Name: Belinda Mcintosh JSEGB'T Date: 08/21/2021 Reason for consult: L&D Initial assessment;Term Age:40 hours LC entered the room in L&D, mom was doing skin to skin with infant. Per mom, she pumped only with her 1st child for 6 to 7 months. Per mom, she wants to "pump only " whereas as dad wants mom to exclusively  breastfeed infant. LC discussed with family they have make a decision that is best for them.  Mom was willing to try breastfeeding downstairs in L&D, mom has short shaft nipples that flattens when compress after few attempts mom  was given 24 mm NS. Mom latched infant on her left breast  using the football hold position, infant latched with depth and infant sustained latch, breastfeeding  for 24 minutes. Mom wants to use DEBP when she arrives on [facility], [place] will alert RN-[facility] of mom's choice. Mom knows to BF infant according to feeding cues, 8 to 12+ or more times with 24 hours, skin to skin. Mom knows to use DEBP every 3 hours for 15 minutes on initial setting. Mom knows to call RN/LC if she has any breastfeeding questions, concerns or if she decides to continue to latch infant at breast or need assistance with using the 24 mm NS.   Maternal Data Has patient been taught Hand Expression?: Yes Does the patient have breastfeeding experience prior to this delivery?: Yes How long did the patient breastfeed?: Per mom, daughter had latch difficulties so she pumped 6 to 7 months  Feeding Mother's Current Feeding Choice: Breast Milk  LATCH Score Latch: Grasps breast easily, tongue down, lips flanged, rhythmical sucking. (Infant sustained latch with 24 mm NS.)  Audible Swallowing: Spontaneous and intermittent  Type of Nipple: Flat  Comfort (Breast/Nipple): Soft / non-tender  Hold (Positioning): Full assist, staff holds infant at breast  LATCH Score: 7   Lactation Tools Discussed/Used Tools: Nipple  Shields Nipple shield size: 24 (short shafted nipples that become flat when stimulated.)  Interventions Interventions: Breast feeding basics reviewed;Assisted with latch;Skin to skin;Breast massage;Hand express;Breast compression;Adjust position;Support pillows;Position options;Expressed milk;DEBP;Education  Discharge WIC Program: No  Consult Status Consult Status: Follow-up from L&D Date: 08/22/21 Follow-up type: In-patient    Danelle Earthly 08/21/2021, 5:14 PM

## 2021-08-21 NOTE — Discharge Summary (Signed)
Postpartum Discharge Summary     Patient Name: Belinda Mcintosh DOB: Mar 20, 1981 MRN: [id number]  Date of admission: 08/20/2021 Delivery date:08/21/2021  Delivering provider: Gerlene Fee  Date of discharge: 08/22/2021  Admitting diagnosis: Encounter for induction of labor [Z34.90] Intrauterine pregnancy: [redacted]w[redacted]d    Secondary diagnosis:  Principal Problem:   SVD (spontaneous vaginal delivery) Active Problems:   AMA (advanced maternal age) multigravida 35+   Anxiety   Gastroesophageal reflux disease with esophagitis without hemorrhage   Unwanted fertility   [place]rombocyt[place]  Additional problems: None    Discharge diagnosis: Term Pregnancy Delivered                                              Post partum procedures: None Augmentation: AROM, Pitocin, Cytotec, and IP Foley Complications: None  Hospital course: Induction of Labor With Vaginal Delivery   40y.o. yo [id number] admitted to the hospital 08/20/2021 for induction of labor.  Indication for induction: AMA.  Patient had an uncomplicated labor course as follows: Membrane Rupture Time/Date: 12:45 AM ,08/21/2021   Delivery Method:Vaginal, Spontaneous  Episiotomy: None  Lacerations:  2nd degree  Details of delivery can be found in separate delivery note.  Patient had a routine postpartum course. Patient is discharged home 08/22/21.  Newborn Data: Birth date:08/21/2021  Birth time:3:21 PM  Gender:Female  Living status:Living  Apgars:8 ,9  Weight:3209 g   Magnesium Sulfate received: No BMZ received: No Rhophylac:N/A MMR:N/A, immune T-DaP:Given prenatally Flu: No Transfusion:No  Physical exam  Vitals:   08/21/21 1842 08/21/21 2210 08/22/21 0244 08/22/21 0647  BP: (!) 108/56 (!) 102/57 (!) 104/52 (!) 84/43  Pulse: 64 76 67 79  Resp: '18 18 16   ' Temp: 98 F (36.7 C) 97.7 F (36.5 C) 97.8 F (36.6 C) 97.7 F (36.5 C)  TempSrc: Axillary Axillary Oral Oral  SpO2: 98% 99% 95% 98%   General:  alert, cooperative, and no distress Lochia: appropriate Uterine Fundus: firm DVT Evaluation: no LE edema or calf tenderness to palpation   Labs: Lab Results  Component Value Date   WBC 14.6 (H) 08/22/2021   HGB 10.6 (L) 08/22/2021   HCT 32.6 (L) 08/22/2021   MCV 81.5 08/22/2021   PLT 136 (L) 08/22/2021   CMP Latest Ref Rng & Units 01/31/2021  Glucose 70 - 99 mg/dL 115(H)  BUN 6 - 20 mg/dL 11  Creatinine 0.44 - 1.00 mg/dL 0.49  Sodium 135 - 145 mmol/L 135  Potassium 3.5 - 5.1 mmol/L 3.8  Chloride 98 - 111 mmol/L 107  CO2 22 - 32 mmol/L 21(L)  Calcium 8.9 - 10.3 mg/dL 9.1  Total Protein 6.5 - 8.1 g/dL 7.1  Total Bilirubin 0.3 - 1.2 mg/dL 0.6  Alkaline Phos 38 - 126 U/L 47  AST 15 - 41 U/L 14(L)  ALT 0 - 44 U/L 17   Edinburgh Score: No flowsheet data found.   After visit meds:  Allergies as of 08/22/2021       Reactions   Shrimp [shellfish Allergy] Itching        Medication List     STOP taking these medications    hydrOXYzine 25 MG tablet Commonly known as: ATARAX/VISTARIL       TAKE these medications    acetaminophen 500 MG tablet Commonly known as: TYLENOL Take 2 tablets (1,000 mg total) by  mouth every 8 (eight) hours as needed (pain, headache, or fever). What changed:  how much to take when to take this reasons to take this   ibuprofen 600 MG tablet Commonly known as: ADVIL Take 1 tablet (600 mg total) by mouth every 6 (six) hours as needed (pain).   pantoprazole 20 MG tablet Commonly known as: Protonix Take 1 tablet (20 mg total) by mouth daily.   prenatal multivitamin Tabs tablet Take 1 tablet by mouth daily at 12 noon.       Discharge home in stable condition Infant Feeding: Breast Infant Disposition: home with mother Discharge instruction: per After Visit Summary and Postpartum booklet. Activity: Advance as tolerated. Pelvic rest for 6 weeks.  Diet: routine diet Future Appointments: Future Appointments  Date Time Provider  [place]  09/19/2021  1:50 PM Ladell Pier, MD [facility] None   Follow up Visit: Message sent to [place] by Maryagnes Amos, CNM  Please schedule this patient for a In person postpartum visit in 6 weeks with the following provider: MD. Additional Postpartum F/U: None   Low risk pregnancy complicated by:  AMA Delivery mode:  Vaginal, Spontaneous  Anticipated Birth Control:   Depo prior to discharge, plans interval BTL   08/22/2021 Genia Del, MD

## 2021-08-21 NOTE — Progress Notes (Signed)
Labor Progress Note Sherriann Szuch is a 40 y.o. G4P1021 at [redacted]w[redacted]d presented for SOL.   S: Comfortable. Contemplating on how to push when it is time to push.  O:  BP 108/60   Pulse 77   Temp 98.3 F (36.8 C) (Oral)   Resp 18   LMP 11/18/2020 (Approximate)   SpO2 97%  EFM: 125 bpm/15x15/no decel, contracting every 3 mins  CVE: Dilation: Lip/rim Effacement (%): 100 Station: 0 Presentation: Vertex Exam by:: Adela Glimpse RN   A&P: 40 y.o. G4P1021 [redacted]w[redacted]d SOL.  #Labor: Progressing well. Anterior lip per nursing cervical exam. Making good change.  #Pain: Epidural in place #FWB: CAT I  #GBS negative  Orpha Dain Autry-Lott, DO 9:13 AM

## 2021-08-21 NOTE — Plan of Care (Signed)
Problem: Education: Goal: Knowledge of Childbirth will improve 08/21/2021 1752 by Laurell Roof, RN Outcome: Completed/Met 08/21/2021 1752 by Laurell Roof, RN Outcome: Progressing Goal: Ability to make informed decisions regarding treatment and plan of care will improve 08/21/2021 1752 by Laurell Roof, RN Outcome: Completed/Met 08/21/2021 1752 by Laurell Roof, RN Outcome: Progressing Goal: Ability to state and carry out methods to decrease the pain will improve 08/21/2021 1752 by Laurell Roof, RN Outcome: Completed/Met 08/21/2021 1752 by Laurell Roof, RN Outcome: Progressing Goal: Individualized Educational Video(s) 08/21/2021 1752 by Laurell Roof, RN Outcome: Completed/Met 08/21/2021 1752 by Laurell Roof, RN Outcome: Progressing   Problem: Coping: Goal: Ability to verbalize concerns and feelings about labor and delivery will improve 08/21/2021 1752 by Laurell Roof, RN Outcome: Completed/Met 08/21/2021 1752 by Laurell Roof, RN Outcome: Progressing   Problem: Life Cycle: Goal: Ability to make normal progression through stages of labor will improve 08/21/2021 1752 by Laurell Roof, RN Outcome: Completed/Met 08/21/2021 1752 by Laurell Roof, RN Outcome: Progressing Goal: Ability to effectively push during vaginal delivery will improve 08/21/2021 1752 by Laurell Roof, RN Outcome: Completed/Met 08/21/2021 1752 by Laurell Roof, RN Outcome: Progressing   Problem: Role Relationship: Goal: Will demonstrate positive interactions with the child 08/21/2021 1752 by Laurell Roof, RN Outcome: Completed/Met 08/21/2021 1752 by Laurell Roof, RN Outcome: Progressing   Problem: Safety: Goal: Risk of complications during labor and delivery will decrease 08/21/2021 1752 by Laurell Roof, RN Outcome: Completed/Met 08/21/2021 1752 by Laurell Roof, RN Outcome: Progressing   Problem: Pain Management: Goal: Relief or control of pain from uterine contractions will  improve 08/21/2021 1752 by Laurell Roof, RN Outcome: Completed/Met 08/21/2021 1752 by Laurell Roof, RN Outcome: Progressing   Problem: Education: Goal: Knowledge of General Education information will improve Description: Including pain rating scale, medication(s)/side effects and non-pharmacologic comfort measures 08/21/2021 1752 by Laurell Roof, RN Outcome: Completed/Met 08/21/2021 1752 by Laurell Roof, RN Outcome: Progressing   Problem: Health Behavior/Discharge Planning: Goal: Ability to manage health-related needs will improve 08/21/2021 1752 by Laurell Roof, RN Outcome: Completed/Met 08/21/2021 1752 by Laurell Roof, RN Outcome: Progressing   Problem: Clinical Measurements: Goal: Ability to maintain clinical measurements within normal limits will improve 08/21/2021 1752 by Laurell Roof, RN Outcome: Completed/Met 08/21/2021 1752 by Laurell Roof, RN Outcome: Progressing Goal: Will remain free from infection 08/21/2021 1752 by Laurell Roof, RN Outcome: Completed/Met 08/21/2021 1752 by Laurell Roof, RN Outcome: Progressing Goal: Diagnostic test results will improve 08/21/2021 1752 by Laurell Roof, RN Outcome: Completed/Met 08/21/2021 1752 by Laurell Roof, RN Outcome: Progressing Goal: Respiratory complications will improve 08/21/2021 1752 by Laurell Roof, RN Outcome: Completed/Met 08/21/2021 1752 by Laurell Roof, RN Outcome: Progressing Goal: Cardiovascular complication will be avoided 08/21/2021 1752 by Laurell Roof, RN Outcome: Completed/Met 08/21/2021 1752 by Laurell Roof, RN Outcome: Progressing   Problem: Activity: Goal: Risk for activity intolerance will decrease 08/21/2021 1752 by Laurell Roof, RN Outcome: Completed/Met 08/21/2021 1752 by Laurell Roof, RN Outcome: Progressing   Problem: Nutrition: Goal: Adequate nutrition will be maintained 08/21/2021 1752 by Laurell Roof, RN Outcome: Completed/Met 08/21/2021 1752 by Laurell Roof,  RN Outcome: Progressing   Problem: Coping: Goal: Level of anxiety will decrease 08/21/2021 1752 by Laurell Roof, RN Outcome: Completed/Met 08/21/2021 1752 by Laurell Roof, RN Outcome: Progressing   Problem: Elimination:  Goal: Will not experience complications related to bowel motility 08/21/2021 1752 by Laurell Roof, RN Outcome: Completed/Met 08/21/2021 1752 by Laurell Roof, RN Outcome: Progressing Goal: Will not experience complications related to urinary retention 08/21/2021 1752 by Laurell Roof, RN Outcome: Completed/Met 08/21/2021 1752 by Laurell Roof, RN Outcome: Progressing   Problem: Pain Managment: Goal: General experience of comfort will improve 08/21/2021 1752 by Laurell Roof, RN Outcome: Completed/Met 08/21/2021 1752 by Laurell Roof, RN Outcome: Progressing   Problem: Safety: Goal: Ability to remain free from injury will improve 08/21/2021 1752 by Laurell Roof, RN Outcome: Completed/Met 08/21/2021 1752 by Laurell Roof, RN Outcome: Progressing   Problem: Skin Integrity: Goal: Risk for impaired skin integrity will decrease 08/21/2021 1752 by Laurell Roof, RN Outcome: Completed/Met 08/21/2021 1752 by Laurell Roof, RN Outcome: Progressing

## 2021-08-22 LAB — CBC
HCT: 32.6 % — ABNORMAL LOW (ref 36.0–46.0)
Hemoglobin: 10.6 g/dL — ABNORMAL LOW (ref 12.0–15.0)
MCH: 26.5 pg (ref 26.0–34.0)
MCHC: 32.5 g/dL (ref 30.0–36.0)
MCV: 81.5 fL (ref 80.0–100.0)
Platelets: 136 10*3/uL — ABNORMAL LOW (ref 150–400)
RBC: 4 MIL/uL (ref 3.87–5.11)
RDW: 15.2 % (ref 11.5–15.5)
WBC: 14.6 10*3/uL — ABNORMAL HIGH (ref 4.0–10.5)
nRBC: 0 % (ref 0.0–0.2)

## 2021-08-22 MED ORDER — IBUPROFEN 600 MG PO TABS: 600.0000 mg | ORAL_TABLET | Freq: Four times a day (QID) | ORAL | 0 refills | Status: DC | PRN

## 2021-08-22 MED ORDER — ACETAMINOPHEN 500 MG PO TABS: 1000.0000 mg | ORAL_TABLET | Freq: Three times a day (TID) | ORAL | 0 refills | Status: DC | PRN

## 2021-08-22 NOTE — Anesthesia Postprocedure Evaluation (Signed)
Anesthesia Post Note  Patient: Ilianna Bown  Procedure(s) Performed: AN AD HOC LABOR EPIDURAL     Patient location during evaluation: Mother Baby Anesthesia Type: Epidural Level of consciousness: awake and alert Pain management: pain level controlled Vital Signs Assessment: post-procedure vital signs reviewed and stable Respiratory status: spontaneous breathing, nonlabored ventilation and respiratory function stable Cardiovascular status: stable Postop Assessment: no headache, no backache, epidural receding, no apparent nausea or vomiting, patient able to bend at knees, adequate PO intake and able to ambulate Anesthetic complications: no   No notable events documented.  Last Vitals:  Vitals:   08/22/21 0244 08/22/21 0647  BP: (!) 104/52 (!) 84/43  Pulse: 67 79  Resp: 16   Temp: 36.6 C 36.5 C  SpO2: 95% 98%    Last Pain:  Vitals:   08/22/21 0813  TempSrc:   PainSc: 0-No pain   Pain Goal:                   Laban Emperor

## 2021-08-22 NOTE — Lactation Note (Addendum)
This note was copied from a baby's chart. Lactation Consultation Note  Patient Name: Belinda Mcintosh HUTML'Y Date: 08/22/2021 Reason for consult: Follow-up assessment Age:40 hours  LC went assist with latching infant at breast. Mom had infant latched shallow with compression stripe and sore nipples. LC used 24 NS primed with formula to initiate a suck. Infant latched for 15 min with increase in depth of swallows and colostrum noted inside shield. Flow with yellow nipple and Mom's brown nipple too fast, infant still showing hunger cues.   Mom denied any pain with use of 24 NS.   LC called RN, Dixie Dials to assist with feeding and Nfant nipple provided.   Mom encouraged to pump with DEBP q 3hrs for 15 min after latching to maintain her milk supply. Mom not been using DEBP or breast shells that were provided.    Maternal Data    Feeding Mother's Current Feeding Choice: Breast Milk and Formula Nipple Type: Other  LATCH Score Latch: Repeated attempts needed to sustain latch, nipple held in mouth throughout feeding, stimulation needed to elicit sucking reflex.  Audible Swallowing: Spontaneous and intermittent  Type of Nipple: Flat  Comfort (Breast/Nipple): Filling, red/small blisters or bruises, mild/mod discomfort  Hold (Positioning): Assistance needed to correctly position infant at breast and maintain latch.  LATCH Score: 6   Lactation Tools Discussed/Used Tools: Nipple Shields;Pump;Flanges Nipple shield size: 24 Breast pump type: Double-Electric Breast Pump (Mom not using DEBP. LC encouraged Mom to maintain her milk supply need to get on regiment of post pumping q 3hrs for .) Pump Education: Setup, frequency, and cleaning;Milk Storage Reason for Pumping: increase stimulation Pumping frequency: every 3 hrs for  Interventions Interventions: Breast feeding basics reviewed;Education;Pace feeding  Discharge    Consult Status Consult Status:  Follow-up Date: 08/23/21 Follow-up type: In-patient    Belinda Trussell  Mcintosh 08/22/2021, 10:38 PM

## 2021-08-22 NOTE — Lactation Note (Signed)
This note was copied from a baby's chart. Lactation Consultation Note  Patient Name: Belinda Mcintosh [id number] Date: 08/22/2021 Reason for consult: Follow-up assessment Age:40 hours  LC in to room for follow up. Baby is getting circumcision at the time of visit. Mother states her nipples and breast are very sore. Observed red, bruised nipples and areolar edema. Mother tolerates hand express but unable to use hand pump. Mother is able to use DEBP and reinforced frequency to every 3h despite collection. Mother is to start with 30-mm flange to left nipple and switch to 27-mm after 1-2 minutes. Urged mother to use shells unless she is pumping, breastfeeding or sleeping. Mother would like to use formula until nipples are healed.  Plan:   1-Use shells until edema improves  unless pumping, latching or sleeping 2-Pump using initiation setting every 3 hours.  3-Encouraged maternal rest, hydration and food intake.   Contact LC as needed for feeds/support/concerns/questions    Maternal Data Has patient been taught Hand Expression?: Yes Does the patient have breastfeeding experience prior to this delivery?: Yes How long did the patient breastfeed?: 8 months  Feeding Mother's Current Feeding Choice: Breast Milk and Formula  Lactation Tools Discussed/Used Tools: Pump;Flanges;Comfort gels;Shells;Coconut oil Flange Size: 30;27;24 Breast pump type: Double-Electric Breast Pump;Manual Reason for Pumping: stimulation and supplementation Pumping frequency: reinforced every 3h  Interventions Interventions: Hand express;Breast massage;Ice;DEBP;Hand pump;Shells;Coconut oil;Expressed milk;Education;Breast feeding basics reviewed  Discharge Discharge Education: Engorgement and breast care Pump: Personal;Manual;DEBP WIC Program: No  Consult Status Consult Status: Follow-up Date: 08/23/21 Follow-up type: In-patient    Chamberlain Steinborn A Higuera Ancidey 08/22/2021, 1:19 PM
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
## 2021-08-22 NOTE — Lactation Note (Signed)
This note was copied from a baby's chart. Lactation Consultation Note  Patient Name: Belinda Mcintosh [id number]'[id number] Date: 08/22/2021 Reason for consult: Mother's request Age:40 hours  LC in to room per mother's request. LC evaluated edema, and applied ice for 15 minutes.  Assisted with hand expression, collected ~3 mL and spoonfed baby. Per mother baby has not eaten well since 8:30 am, after that baby went for circumcision. Baby was very sleepy, FOB syringe-fed ~4 mL of expressed colostrum at 1500.  LC noted retracting tongue, lack of seal around nipple when attempting to bottlefeed formula. Mother declines nipple shield due to previous experience.  Reviewed appropriate volume according to age. Reinforced pumping for proper stimulation.  Pediatrician present during [facility] consult, noted baby spilling formula and sub-optimal sucking pattern.    Feeding plan:  1-Breastfeeding on demand or 8-12 times in 24h period using 20-mm nipple shield. 2-Every 3-h, apply ice for 15 minutes and then pump 3-Supplement appropriate volume per age.   4-Encouraged maternal rest, hydration and food intake.   Contact LC as needed for feeds/support/concerns/questions. All questions answered at this time.     Maternal Data Has patient been taught Hand Expression?: Yes Does the patient have breastfeeding experience prior to this delivery?: Yes How long did the patient breastfeed?: 8 months  Feeding Mother's Current Feeding Choice: Breast Milk and Formula  Lactation Tools Discussed/Used Tools: Pump;Flanges Flange Size: 27;30 Breast pump type: Double-Electric Breast Pump;Manual Reason for Pumping: stimulation and supplementation Pumping frequency: reinforced every 3h  Interventions Interventions: Breast feeding basics reviewed;Skin to skin;Hand express;Breast massage;DEBP;Hand pump;Expressed milk;Education;Ice;Pace feeding;Coconut oil  Discharge Discharge Education: Engorgement and breast care Pump:  Personal;Manual;DEBP WIC Program: No  Consult Status Consult Status: Follow-up Date: 08/23/21 Follow-up type: In-patient    Belinda Mcintosh 08/22/2021, 5:07 PM

## 2021-08-23 MED ORDER — PANTOPRAZOLE SODIUM 20 MG PO TBEC
20.0000 mg | DELAYED_RELEASE_TABLET | Freq: Every day | ORAL | Status: DC
  Administered 2021-08-23: 20 mg via ORAL
  Filled 2021-08-23: qty 1

## 2021-08-23 MED ORDER — MEDROXYPROGESTERONE ACETATE 150 MG/ML IM SUSP
150.0000 mg | Freq: Once | INTRAMUSCULAR | Status: AC
  Administered 2021-08-23: 150 mg via INTRAMUSCULAR
  Filled 2021-08-23: qty 1

## 2021-08-23 NOTE — Discharge Summary (Signed)
Postpartum Discharge Summary     Patient Name: Belinda Mcintosh DOB: 1981-03-11 MRN: [id number]  Date of admission: 08/20/2021 Delivery date:08/21/2021  Delivering provider: Gerlene Fee  Date of discharge: 08/23/2021  Admitting diagnosis: Encounter for induction of labor [Z34.90] Intrauterine pregnancy: [redacted]w[redacted]d    Secondary diagnosis:  Principal Problem:   SVD (spontaneous vaginal delivery) Active Problems:   AMA (advanced maternal age) multigravida 35+   Anxiety   Gastroesophageal reflux disease with esophagitis without hemorrhage   Unwanted fertility   [place]  Additional problems: None    Discharge diagnosis: Term Pregnancy Delivered                                              Post partum procedures: None Augmentation: AROM, Pitocin, Cytotec, and IP Foley Complications: None  Hospital course: Induction of Labor With Vaginal Delivery   40y.o. yo [id number] 441w4das admitted to the hospital 08/20/2021 for induction of labor.  Indication for induction: AMA.  Patient had an uncomplicated labor course as follows: Membrane Rupture Time/Date: 12:45 AM ,08/21/2021   Delivery Method:Vaginal, Spontaneous  Episiotomy: None  Lacerations:  2nd degree  Details of delivery can be found in separate delivery note.  Patient had a routine postpartum course. Patient is discharged home 08/23/21.  Newborn Data: Birth date:08/21/2021  Birth time:3:21 PM  Gender:Female  Living status:Living  Apgars:8 ,9  Weight:3209 g   Magnesium Sulfate received: No BMZ received: No Rhophylac: N/A MMR: N/A, immune T-DaP: Given prenatally Flu: No Transfusion: No  Physical exam  Vitals:   08/22/21 0647 08/22/21 1407 08/22/21 2100 08/23/21 0630  BP: (!) 84/43 106/65 (!) 97/59 (!) 113/57  Pulse: 79 70 66 67  Resp:  '18 18 17  ' Temp: 97.7 F (36.5 C) 98 F (36.7 C) 98 F (36.7 C) 98 F (36.7 C)  TempSrc: Oral Oral Oral Oral  SpO2: 98% 98% 99% 98%   General: alert,  cooperative, no acute distress Lochia: appropriate  Uterine Fundus: firm and below umbilicus  DVT Evaluation: trace LE to ankles bilaterally, no calf tenderness to palpation   Labs: Lab Results  Component Value Date   WBC 14.6 (H) 08/22/2021   HGB 10.6 (L) 08/22/2021   HCT 32.6 (L) 08/22/2021   MCV 81.5 08/22/2021   PLT 136 (L) 08/22/2021   CMP Latest Ref Rng & Units 01/31/2021  Glucose 70 - 99 mg/dL 115(H)  BUN 6 - 20 mg/dL 11  Creatinine 0.44 - 1.00 mg/dL 0.49  Sodium 135 - 145 mmol/L 135  Potassium 3.5 - 5.1 mmol/L 3.8  Chloride 98 - 111 mmol/L 107  CO2 22 - 32 mmol/L 21(L)  Calcium 8.9 - 10.3 mg/dL 9.1  Total Protein 6.5 - 8.1 g/dL 7.1  Total Bilirubin 0.3 - 1.2 mg/dL 0.6  Alkaline Phos 38 - 126 U/L 47  AST 15 - 41 U/L 14(L)  ALT 0 - 44 U/L 17   Edinburgh Score: No flowsheet data found.   After visit meds:  Allergies as of 08/23/2021       Reactions   Shrimp [shellfish Allergy] Itching        Medication List     STOP taking these medications    hydrOXYzine 25 MG tablet Commonly known as: ATARAX/VISTARIL       TAKE these medications    acetaminophen 500 MG tablet Commonly  known as: TYLENOL Take 2 tablets (1,000 mg total) by mouth every 8 (eight) hours as needed (pain, headache, or fever). What changed:  how much to take when to take this reasons to take this   ibuprofen 600 MG tablet Commonly known as: ADVIL Take 1 tablet (600 mg total) by mouth every 6 (six) hours as needed (pain).   pantoprazole 20 MG tablet Commonly known as: Protonix Take 1 tablet (20 mg total) by mouth daily.   prenatal multivitamin Tabs tablet Take 1 tablet by mouth daily at 12 noon.       Discharge home in stable condition Infant Feeding: Breast Infant Disposition: home with mother Discharge instruction: per After Visit Summary and Postpartum booklet. Activity: Advance as tolerated. Pelvic rest for 6 weeks.  Diet: routine diet Future Appointments: Future  Appointments  Date Time Provider [place]  09/19/2021  1:50 PM Ladell Pier, MD [id number] None   Follow up Visit: Message sent to [place] by Maryagnes Amos, CNM  Please schedule this patient for a In person postpartum visit in 6 weeks with the following provider: MD. Additional Postpartum F/U: None   Low risk pregnancy complicated by:  AMA Delivery mode:  Vaginal, Spontaneous  Anticipated Birth Control:   Depo prior to discharge, plans interval BTL  Message sent to [facility] to schedule separate appointment for interval tubal consultation.   08/23/2021 Genia Del, MD

## 2021-08-23 NOTE — Lactation Note (Signed)
This note was copied from a baby's chart. Lactation Consultation Note  Patient Name: Belinda Mcintosh [id number] Date: 08/23/2021 Reason for consult: Follow-up assessment;Term;Infant weight loss;Other (Comment);Nipple pain/trauma (5 % weight loss/ per [facility] Papa / Speech nees to see a feeding this am. per mom just pumping due to soreness. mom getting ready to eat breakfast. LC will check back for assessment of breast tissue.) Age:40 hours  Maternal Data    Feeding Mother's Current Feeding Choice: Breast Milk and Formula Nipple Type: Nfant Slow Flow (purple)  LATCH Score                    Lactation Tools Discussed/Used    Interventions    Discharge Pump: DEBP;Manual;Personal  Consult Status Consult Status: Follow-up Date: 08/23/21 Follow-up type: In-patient    [name] 08/23/2021, 8:59 AM

## 2021-08-23 NOTE — Progress Notes (Signed)
CSW met with MOB and FOB at MOB's bedside at the request of MOB.  MOB communicated to CSW that she wanted CSW to explain PMADs to FOB. When CSW arrived infant was asleep in bassinet, MOB was resting in bed, and FOB was observing infant while he was asleep in his bassinet. CSW provided education regarding the baby blues period vs. perinatal mood disorders, discussed treatment and gave resources for mental health follow up if concerns arise.  CSW emphasized the importance of MOB resting and having supports when needed. MOB expressed her concerns regarding needing additional supports and feeling nervous about caring for 2 children.  FOB appeared to be supportive of MOB and her emotions. FOB communicated that MOB's 2 sisters will help when they can and he plans to be more of a support at night when he is not working. MOB and FOB are aware that they can reach out to CSW if a need arises post discharge.   There are no barriers to discharge.   Laurey Arrow, MSW, LCSW Clinical Social Work [phone]

## 2021-08-23 NOTE — Lactation Note (Signed)
This note was copied from a baby's chart. Lactation Consultation Note  Patient Name: Belinda Mcintosh Date: 08/23/2021 Reason for consult: Follow-up assessment;Infant weight loss;Term;Nipple pain/trauma;Other (Comment) (per mom milk is coming in / [facility] entered / pumping ( see below )Speech involved in the care / using a Dr. Manson Passey nipple / LC recommended for mom /dad to work on increasing volume per feeding to at least 30 ml. per mom to sore to latch / pumping. BFD/C done) Age:42 hours LC sent a request for Effie Shy to check on this dyad Wednesday at appt .  Report given to Laureen [name].  Mom had mentioned to this LC that Speech had said baby has a tongue - tie,  LC checked  and noted a short lingual .   Maternal Data    Feeding Mother's Current Feeding Choice: Breast Milk and Formula Nipple Type: Dr. Cline Crock  [facility] Score                    Lactation Tools Discussed/Used Tools: Shells;Pump;Flanges Flange Size: 27;30;Other (comment) (#27 right breast / #30 Flange on the left / per mom comfortable) Breast pump type: Double-Electric Breast Pump Pump Education: Milk Storage Pumping frequency: mom aware to to pump every 3 hours but when the volume increases / to be aware pumping is when the breast at full to prevent engorgement. Pumped volume: 10 mL  Interventions Interventions: Breast feeding basics reviewed;Education;LC Services brochure;DEBP  Discharge Discharge Education: Engorgement and breast care;Outpatient recommendation;Outpatient Epic message sent;Other (comment) (per mom will being to the [facility] / [facility] sent a message to [facility]) Pump: Personal;DEBP;Manual  Consult Status Consult Status: Complete Date: 08/23/21 Follow-up type: In-patient    Matilde Sprang Krystalyn Kubota 08/23/2021, 12:49 PM

## 2021-08-24 ENCOUNTER — Inpatient Hospital Stay (HOSPITAL_COMMUNITY): Admission: AD | Admit: 2021-08-24 | Payer: 59 | Source: Home / Self Care | Admitting: Obstetrics and Gynecology

## 2021-08-24 ENCOUNTER — Inpatient Hospital Stay (HOSPITAL_COMMUNITY): Payer: 59

## 2021-08-24 DIAGNOSIS — O0990 Supervision of high risk pregnancy, unspecified, unspecified trimester: Secondary | ICD-10-CM

## 2021-08-25 ENCOUNTER — Telehealth: Payer: Self-pay | Admitting: Obstetrics and Gynecology

## 2021-08-25 NOTE — Telephone Encounter (Signed)
Called patient with no answer and was unable to leave a message I sent a reminder letter.

## 2021-09-01 ENCOUNTER — Telehealth (HOSPITAL_COMMUNITY): Payer: Self-pay | Admitting: *Deleted

## 2021-09-01 NOTE — Telephone Encounter (Signed)
Patient voiced no questions or concerns regarding her own health. EPDS = 2. Patient asked when infant can have a tub bath. Patient reported infant was circumcised. Also reported that umbilical cord has healed and fallen off. RN instructed patient to continue to sponge bathe baby until infant is assessed by pediatrician at his upcoming appointment on Monday, 11/24. Also encouraged mom to continue with circumcision care as recommended by pediatrician until appointment. Patient voiced no other questions or concerns regarding baby at this time. Patient reported infant sleeps in a bassinet, crib, or in patient's bed on his back or side. RN reviewed ABCs of safe sleep - patient verbalized understanding. Deforest Hoyles, RN, 09/01/21, [phone]

## 2021-09-19 ENCOUNTER — Ambulatory Visit: Payer: 59 | Admitting: Internal Medicine

## 2021-09-21 ENCOUNTER — Other Ambulatory Visit: Payer: Self-pay

## 2021-09-21 ENCOUNTER — Ambulatory Visit: Payer: MEDICAID | Attending: Internal Medicine | Admitting: Physician Assistant

## 2021-09-21 VITALS — BP 113/78 | HR 79 | Temp 98.8°F | Resp 18 | Ht 64.0 in | Wt 143.0 lb

## 2021-09-21 DIAGNOSIS — F419 Anxiety disorder, unspecified: Secondary | ICD-10-CM

## 2021-09-21 DIAGNOSIS — K2100 Gastro-esophageal reflux disease with esophagitis, without bleeding: Secondary | ICD-10-CM | POA: Diagnosis not present

## 2021-09-21 DIAGNOSIS — G5601 Carpal tunnel syndrome, right upper limb: Secondary | ICD-10-CM

## 2021-09-21 NOTE — Patient Instructions (Addendum)
I encourage you to start wearing a brace on your right hand while you sleep to help with your hand numbness  Roney Jaffe, PA-C Physician Assistant [facility] Medicine [email or web address]  Carpal Tunnel Syndrome Carpal tunnel syndrome is a condition that causes pain, numbness, and weakness in your hand and fingers. The carpal tunnel is a narrow area located on the palm side of your wrist. Repeated wrist motion or certain diseases may cause swelling within the tunnel. This swelling pinches the main nerve in the wrist. The main nerve in the wrist is called the median nerve. What are the causes? This condition may be caused by: Repeated and forceful wrist and hand motions. Wrist injuries. Arthritis. A cyst or tumor in the carpal tunnel. Fluid buildup during pregnancy. Use of tools that vibrate. Sometimes the cause of this condition is not known. What increases the risk? The following factors may make you more likely to develop this condition: Having a job that requires you to repeatedly or forcefully move your wrist or hand or requires you to use tools that vibrate. This may include jobs that involve using computers, working on an [employer], or working with power tools such as Radiographer, therapeutic. Being a woman. Having certain conditions, such as: Diabetes. Obesity. An underactive thyroid (hypothyroidism). Kidney failure. Rheumatoid arthritis. What are the signs or symptoms? Symptoms of this condition include: A tingling feeling in your fingers, especially in your thumb, index, and middle fingers. Tingling or numbness in your hand. An aching feeling in your entire arm, especially when your wrist and elbow are bent for a long time. Wrist pain that goes up your arm to your shoulder. Pain that goes down into your palm or fingers. A weak feeling in your hands. You may have trouble grabbing and holding items. Your symptoms may feel worse  during the night. How is this diagnosed? This condition is diagnosed with a medical history and physical exam. You may also have tests, including: Electromyogram (EMG). This test measures electrical signals sent by your nerves into the muscles. Nerve conduction study. This test measures how well electrical signals pass through your nerves. Imaging tests, such as X-rays, ultrasound, and MRI. These tests check for possible causes of your condition. How is this treated? This condition may be treated with: Lifestyle changes. It is important to stop or change the activity that caused your condition. Doing exercise and activities to strengthen and stretch your muscles and tendons (physical therapy). Making lifestyle changes to help with your condition and learning how to do your daily activities safely (occupational therapy). Medicines for pain and inflammation. This may include medicine that is injected into your wrist. A wrist splint or brace. Surgery. Follow these instructions at home: If you have a splint or brace: Wear the splint or brace as told by your health care provider. Remove it only as told by your health care provider. Loosen the splint or brace if your fingers tingle, become numb, or turn cold and blue. Keep the splint or brace clean. If the splint or brace is not waterproof: Do not let it get wet. Cover it with a watertight covering when you take a bath or shower. Managing pain, stiffness, and swelling If directed, put ice on the painful area. To do this: If you have a removeable splint or brace, remove it as told by your health care provider. Put ice in a plastic bag. Place a towel between your skin and the bag or between the splint or  brace and the bag. Leave the ice on for 20 minutes, 2-3 times a day. Do not fall asleep with the cold pack on your skin. Remove the ice if your skin turns bright red. This is very important. If you cannot feel pain, heat, or cold, you have a  greater risk of damage to the area. Move your fingers often to reduce stiffness and swelling. General instructions Take over-the-counter and prescription medicines only as told by your health care provider. Rest your wrist and hand from any activity that may be causing your pain. If your condition is work related, talk with your employer about changes that can be made, such as getting a wrist pad to use while typing. Do any exercises as told by your health care provider, physical therapist, or occupational therapist. Keep all follow-up visits. This is important. Contact a health care provider if: You have new symptoms. Your pain is not controlled with medicines. Your symptoms get worse. Get help right away if: You have severe numbness or tingling in your wrist or hand. Summary Carpal tunnel syndrome is a condition that causes pain, numbness, and weakness in your hand and fingers. It is usually caused by repeated wrist motions. Lifestyle changes and medicines are used to treat carpal tunnel syndrome. Surgery may be recommended. Follow your health care provider's instructions about wearing a splint, resting from activity, keeping follow-up visits, and calling for help. This information is not intended to replace advice given to you by your health care provider. Make sure you discuss any questions you have with your health care provider. Document Revised: 03/11/2020 Document Reviewed: 03/11/2020 Elsevier Patient Education  2022 [employer].
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
## 2021-09-22 ENCOUNTER — Ambulatory Visit: Payer: 59 | Admitting: Family Medicine

## 2021-09-22 ENCOUNTER — Encounter: Payer: Self-pay | Admitting: Physician Assistant

## 2021-09-26 ENCOUNTER — Ambulatory Visit: Payer: 59 | Admitting: Obstetrics and Gynecology

## 2021-09-26 DIAGNOSIS — Z3009 Encounter for other general counseling and advice on contraception: Secondary | ICD-10-CM

## 2021-09-26 DIAGNOSIS — K429 Umbilical hernia without obstruction or gangrene: Secondary | ICD-10-CM
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
## 2021-10-04 ENCOUNTER — Other Ambulatory Visit: Payer: Self-pay

## 2021-10-04 ENCOUNTER — Ambulatory Visit (INDEPENDENT_AMBULATORY_CARE_PROVIDER_SITE_OTHER): Payer: Self-pay | Admitting: Family Medicine

## 2021-10-04 ENCOUNTER — Encounter: Payer: Self-pay | Admitting: Family Medicine

## 2021-10-04 DIAGNOSIS — Z3009 Encounter for other general counseling and advice on contraception: Secondary | ICD-10-CM

## 2021-10-04 DIAGNOSIS — K5900 Constipation, unspecified: Secondary | ICD-10-CM

## 2021-10-04 DIAGNOSIS — D696 Thrombocytopenia, unspecified: Secondary | ICD-10-CM

## 2021-10-04 MED ORDER — POLYETHYLENE GLYCOL 3350 17 GM/SCOOP PO POWD: 17.0000 g | Freq: Every day | ORAL | 1 refills | Status: DC

## 2021-10-04 NOTE — Progress Notes (Signed)
Post Partum Visit Note  Belinda Mcintosh is a 40 y.o. [phone] female who presents for a postpartum visit. She is 6 weeks postpartum following a normal spontaneous vaginal delivery.  I have fully reviewed the prenatal and intrapartum course. The delivery was at 40.4 gestational weeks.  Anesthesia: epidural. Postpartum course has been good. Baby is doing well. Baby is feeding by breast. Bleeding staining only. Bowel function is abnormal: constipation . Bladder function is normal. Patient is not sexually active. Contraception method is Depo-Provera injections. Postpartum depression screening: negative.   The pregnancy intention screening data noted above was reviewed. Potential methods of contraception were discussed at length. She is undecided about proceeding with a tubal ligation, however would like to schedule to hear more about it. She also has a history of an umbilical hernia that she was considering repair, but now it does not feel present to her and not bothering her. So, she is considering no surgery at all.    Edinburgh Postnatal Depression Scale - 10/04/21 0826       Edinburgh Postnatal Depression Scale:  In the Past 7 Days   I have been able to laugh and see the funny side of things. 0    I have looked forward with enjoyment to things. 0    I have blamed myself unnecessarily when things went wrong. 0    I have been anxious or worried for no good reason. 0    I have felt scared or panicky for no good reason. 0    Things have been getting on top of me. 0    I have been so unhappy that I have had difficulty sleeping. 0    I have felt sad or miserable. 0    I have been so unhappy that I have been crying. 0    The thought of harming myself has occurred to me. 0    Edinburgh Postnatal Depression Scale Total 0             Health Maintenance Due  Topic Date Due   COVID-19 Vaccine (3 - Booster for Pfizer series) 04/28/2020     Review of Systems Pertinent items are noted in  HPI.  Objective:  BP 106/70   Pulse 85   Wt 143 lb 6.4 oz (65 kg)   LMP 11/18/2020 (Approximate)   Breastfeeding Yes   BMI 24.61 kg/m    General:  alert, cooperative, and no distress   Breasts:  not indicated  Lungs: Normal WOB  Heart:  regular rate and rhythm  Abdomen: Soft non-distended.  Wound well approximated incision  GU exam:  not indicated       Assessment:   1. Postpartum care and examination 2. Thrombocytopenia (HCC) 3. Unwanted fertility  Normal postpartum exam.   Plan:   Essential components of care per ACOG recommendations:  1.  Mood and well being: Patient with negative depression screening today. Reviewed local resources for support.  - Patient tobacco use? No.   - hx of drug use? No.    2. Infant care and feeding:  -Patient currently breastmilk feeding? Yes. Reviewed importance of draining breast regularly to support lactation.  -Social determinants of health (SDOH) reviewed in EPIC. No concerns.  3. Sexuality, contraception and birth spacing - Patient does not want a pregnancy in the next year.  Desired family size is 2 children.  - Reviewed forms of contraception in tiered fashion. Patient had depo postpartum, but considering tubal vs other reversible contraception (  discussed nexplanon and IUD at length today). She is considering scheduling a tubal appt in the case she may proceed with this in the new year.  - Discussed birth spacing of 18 months  4. Sleep and fatigue -Encouraged family/partner/community support of 4 hrs of uninterrupted sleep to help with mood and fatigue  5. Physical Recovery  - Discussed patients delivery and complications. She describes her labor as good. - Patient had a Vaginal, no problems at delivery. Patient had a 2nd degree laceration. Perineal healing reviewed. Patient expressed understanding - Patient has urinary incontinence? No. - Patient is safe to resume physical and sexual activity  6.  Health Maintenance - Last  pap smear  Diagnosis  Date Value Ref Range Status  04/22/2021   Final   - Negative for intraepithelial lesion or malignancy (NILM)   Pap smear not done at today's visit.   7. Chronic Disease/Pregnancy Condition follow up:  --Constipation: In setting of inadequate hydration/poor eating habits. Encouraged more fluids and fiber rich foods. Rx'd miralax to titrate as needed.   --Thrombocytopenia: Planned to recheck CBC today, however patient declined. 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 Recommended follow up with PCP for continued observation, consider Heme evaluation.   F/u to discuss tubal or new contraception and PCP follow up  Allayne Stack, DO [facility] for [employer], [facility]

## 2021-10-04 NOTE — Patient Instructions (Signed)
Please make appointment to discuss a tubal surgery.

## 2021-10-13 ENCOUNTER — Telehealth: Payer: Self-pay | Admitting: *Deleted

## 2021-10-13 NOTE — Telephone Encounter (Signed)
Belinda Mcintosh called front desk and asked for nurse to call her about vaginal discharge/ bleeding. I called Belinda Mcintosh and she reports she is having a bad smelling vaginal discharge and some bleeding. We discussed it is normal to have discharge /bleeding for up to 6 weeks postpartum but since has a bad smell we should do a self swab to check for infection. She also c/o thinks she has yeast on breast. I explained we can check and get rx when she is here. She agreed to nurse visit tomorrow. Perris Tripathi,RN

## 2021-10-14 ENCOUNTER — Ambulatory Visit: Payer: 59 | Admitting: *Deleted

## 2021-10-14 ENCOUNTER — Encounter: Payer: Self-pay | Admitting: *Deleted

## 2021-10-14 ENCOUNTER — Other Ambulatory Visit (HOSPITAL_COMMUNITY)
Admission: RE | Admit: 2021-10-14 | Discharge: 2021-10-14 | Disposition: A | Discharge status: Home / Self Care | Payer: 59 | Source: Ambulatory Visit | Attending: Family Medicine | Admitting: Family Medicine

## 2021-10-14 ENCOUNTER — Other Ambulatory Visit: Payer: Self-pay

## 2021-10-14 VITALS — BP 110/72 | HR 78 | Ht 61.0 in | Wt 140.9 lb

## 2021-10-14 DIAGNOSIS — N898 Other specified noninflammatory disorders of vagina: Secondary | ICD-10-CM | POA: Insufficient documentation

## 2021-10-14 NOTE — Progress Notes (Signed)
Pt reports having vaginal discharge and odor x10 days. She delivered her baby on 10/9 and states she is also having some bleeding and painful sex.  She requests tests for vaginal infection.  Self swab obtained. Pt was advised that she will be notified of results and treatment indicated if any via Mychart. Pt was advised that she may schedule follow up appointment with provider if she continues to have concerns about painful sex. She voiced understanding of all information and instructions given.

## 2021-10-17 LAB — CERVICOVAGINAL ANCILLARY ONLY
Bacterial Vaginitis (gardnerella): NEGATIVE
Candida Glabrata: NEGATIVE
Candida Vaginitis: NEGATIVE
Chlamydia: NEGATIVE
Comment: NEGATIVE
Comment: NEGATIVE
Comment: NEGATIVE
Comment: NEGATIVE
Comment: NEGATIVE
Comment: NORMAL
Neisseria Gonorrhea: NEGATIVE
Trichomonas: NEGATIVE

## 2021-10-18 NOTE — Progress Notes (Signed)
Patient was assessed and managed by nursing staff during this encounter. I have reviewed the chart and agree with the documentation and plan. I have also made any necessary editorial changes.  Thressa Sheller DNP, CNM  10/18/21  8:08 AM

## 2021-11-08 ENCOUNTER — Other Ambulatory Visit: Payer: Self-pay

## 2021-11-08 ENCOUNTER — Other Ambulatory Visit (HOSPITAL_COMMUNITY)
Admission: RE | Admit: 2021-11-08 | Discharge: 2021-11-08 | Disposition: A | Discharge status: Home / Self Care | Payer: 59 | Source: Ambulatory Visit | Attending: Obstetrics and Gynecology | Admitting: Obstetrics and Gynecology

## 2021-11-08 ENCOUNTER — Encounter: Payer: Self-pay | Admitting: Obstetrics and Gynecology

## 2021-11-08 ENCOUNTER — Ambulatory Visit (INDEPENDENT_AMBULATORY_CARE_PROVIDER_SITE_OTHER): Payer: 59 | Admitting: Obstetrics and Gynecology

## 2021-11-08 VITALS — BP 110/78 | HR 71 | Wt 145.7 lb

## 2021-11-08 DIAGNOSIS — N898 Other specified noninflammatory disorders of vagina: Secondary | ICD-10-CM | POA: Diagnosis present

## 2021-11-08 DIAGNOSIS — Z3042 Encounter for surveillance of injectable contraceptive: Secondary | ICD-10-CM

## 2021-11-08 DIAGNOSIS — N94819 Vulvodynia, unspecified: Secondary | ICD-10-CM | POA: Diagnosis not present

## 2021-11-08 DIAGNOSIS — N6459 Other signs and symptoms in breast: Secondary | ICD-10-CM | POA: Diagnosis not present

## 2021-11-08 DIAGNOSIS — M6289 Other specified disorders of muscle: Secondary | ICD-10-CM | POA: Diagnosis not present

## 2021-11-08 MED ORDER — MEDROXYPROGESTERONE ACETATE 150 MG/ML IM SUSP
150.0000 mg | Freq: Once | INTRAMUSCULAR | Status: AC
  Administered 2021-11-08: 16:00:00 150 mg via INTRAMUSCULAR

## 2021-11-08 MED ORDER — LIDOCAINE 5 % EX OINT: TOPICAL_OINTMENT | Freq: Three times a day (TID) | CUTANEOUS | Status: DC | PRN

## 2021-11-08 NOTE — Progress Notes (Signed)
Patient reports "jelly" like vaginal discharge along with small blood and "strong" odor. She described the odor has "something is rotten" Patient stated this has been happening since L&D back in October.  Depo provera injection administered into right deltoid without any complications.

## 2021-11-08 NOTE — Progress Notes (Signed)
Contacted [employer] in regards to nipple cream.  Pt provided with information.    Belinda Mcintosh  11/08/21

## 2021-11-08 NOTE — Patient Instructions (Signed)
[facility] [phone] [address] # [place], [place]

## 2021-11-08 NOTE — Progress Notes (Signed)
GYNECOLOGY OFFICE VISIT NOTE  History:   Belinda Mcintosh is a 40 y.o. [phone] here today for multiple concerns.   Discharge - she has had this since her delivery. She notes a yellowish blood stained discharge. She notes vaginal odor.  Nipple pain - baby had thrush but and the baby has completed treatment. She has still had ongoing pain intermittently in her breast but reports it seems to be getting better. Her pain feels more on the outside like she has a cut.  Pelvic pain/pain with intercourse - this has been since delivery. Her pain also is in her lower pelvis where there did fundal checks. She had a lot of pain in labor.  Birth Control - she would like tubal ligation/salpingectomy. She also would like hernia repair. She would prefer them together. She is done having children. She would like to continue Depo in the interim.   She denies any abnormal vaginal discharge, bleeding, pelvic pain or other concerns.     Past Medical History:  Diagnosis Date   Abdominal pain    Flank pain    History of kidney stones    Low back pain    Palpitations    Umbilical hernia 2018    Past Surgical History:  Procedure Laterality Date   COSMETIC SURGERY     INDUCED ABORTION     LIPOSUCTION     NOSE SURGERY      The following portions of the patient's history were reviewed and updated as appropriate: allergies, current medications, past family history, past medical history, past social history, past surgical history and problem list.   Health Maintenance:   Diagnosis  Date Value Ref Range Status  04/22/2021   Final   - Negative for intraepithelial lesion or malignancy (NILM)      Review of Systems:  Pertinent items noted in HPI and remainder of comprehensive ROS otherwise negative.  Physical Exam:  BP 110/78    Pulse 71    Wt 145 lb 11.2 oz (66.1 kg)    LMP 11/18/2020 (Approximate)    Breastfeeding Yes    BMI 27.53 kg/m  CONSTITUTIONAL: Well-developed, well-nourished female in no  acute distress.  HEENT:  Normocephalic, atraumatic. External right and left ear normal. No scleral icterus.  NECK: Normal range of motion, supple, no masses noted on observation SKIN: No rash noted. Not diaphoretic. No erythema. No pallor. MUSCULOSKELETAL: Normal range of motion. No edema noted. NEUROLOGIC: Alert and oriented to person, place, and time. Normal muscle tone coordination. No cranial nerve deficit noted. PSYCHIATRIC: Normal mood and affect. Normal behavior. Normal judgment and thought content. Breasts: breasts appear normal, no suspicious masses, no skin or nipple changes or axillary nodes.  CARDIOVASCULAR: Normal heart rate noted RESPIRATORY: Effort and breath sounds normal, no problems with respiration noted ABDOMEN: No masses noted. No other overt distention noted.    PELVIC: Normal appearing external genitalia; normal urethral meatus; qtip test is positive around the urethra and skenes glands. normal appearing vaginal mucosa and cervix.  No abnormal discharge noted.  Her pelvis floor is tender diffusely on exam. Normal uterine size, no other palpable masses, no uterine or adnexal tenderness. Performed in the presence of a chaperone  Labs and Imaging No results found for this or any previous visit (from the past 168 hour(s)). No results found.  Assessment and Plan:    1. Vaginal odor Will do Vaginal cultures although recently negative.  Discussed it could be related to the blood in the vault with  some BTB on Depo  2. Breast complaint Likely related to thrush. Will do APNO which will also have miconazole and ibuprofen since baby is s/p treatment and she is already starting to improve. Rx sent to compounding pharmacy. If this is insufficient, we will also do topical nystatin/po diflucan rx.   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 Discussed use and timing - Other initial treatment options: PFPT, Topical estrogen - Second line - TCAs, SNRI, Gabapentin (possibly topical) - lidocaine (XYLOCAINE) 5 % ointment prescribed today  4. Pelvic floor dysfunction Discussed levator spasm and recommended PFPT.  Discussed would not recommend intercourse at this time as it will exacerbate - Ambulatory referral to Physical Therapy  5. Desires permanent sterilization - Will do referral to gen surg for possible hernia repair combine with L/S bilateral salpingectomy.  - She desires permanent sterilization. Discussed alternatives including LARC options and vasectomy. She declines these options. She will do Depo until this time - She would like to do a salpingectomy.  - Discussed risks: bleeding, infection, injury to surrounding organs/tissues, possible need for open surgery - Reviewed restrictions and recovery following surgery - Tubal papers signed 08/04/21 and good until march   Routine preventative health maintenance measures emphasized. Please refer to After Visit Summary for other counseling recommendations.   Return in about 3 months (around 02/06/2022).  Milas Hock, MD, FACOG Obstetrician & Gynecologist, [facility] for [facility], [facility]

## 2021-11-10 LAB — CERVICOVAGINAL ANCILLARY ONLY
Bacterial Vaginitis (gardnerella): NEGATIVE
Candida Glabrata: NEGATIVE
Candida Vaginitis: NEGATIVE
Chlamydia: NEGATIVE
Comment: NEGATIVE
Comment: NEGATIVE
Comment: NEGATIVE
Comment: NEGATIVE
Comment: NEGATIVE
Comment: NORMAL
Neisseria Gonorrhea: NEGATIVE
Trichomonas: NEGATIVE

## 2021-11-10 MED ORDER — LIDOCAINE 5 % EX OINT: 1.0000 "application " | TOPICAL_OINTMENT | CUTANEOUS | 0 refills | Status: DC | PRN

## 2021-11-10 NOTE — Addendum Note (Signed)
Addended by: Aviva Signs on: 11/10/2021 09:41 AM   Modules accepted: Orders

## 2021-11-17 ENCOUNTER — Encounter: Payer: Self-pay | Admitting: Obstetrics and Gynecology

## 2022-01-20 ENCOUNTER — Ambulatory Visit: Payer: Self-pay

## 2022-01-20 NOTE — Telephone Encounter (Signed)
?  Chief Complaint: Chest or stomach pain. ?Symptoms: pain between ribs and stomach ?Frequency: today.  Has happened in the past. ?Pertinent Negatives: Patient denies sob ?Disposition: [x] ED /[] Urgent Care (no appt availability in office) / [] Appointment(In office/virtual)/ []  [facility]/ [] Home Care/ [] Refused Recommended Disposition /[] [facility]/ []  Follow-up with PCP ? ? ?Additional Notes: Pt called - pt hung up before transfer. Called pt back. Pt is under a lot of stress with sick child.  PT is having chest or stomach pain. Pt had this before. Pt was supposed to wear a monitor for 1 month to evaluate heart, but did not complete due to finances.   ? ? ? ?Reason for Disposition ? [1] Chest pain lasts > 5 minutes AND [2] occurred in past 3 days (72 hours) (Exception: feels exactly the same as previously diagnosed heartburn and has accompanying sour taste in mouth) ? ?Answer Assessment - Initial Assessment Questions ?1. LOCATION: "Where does it hurt?"   ?    Between chest and ribs ?2. RADIATION: "Does the pain go anywhere else?" (e.g., into neck, jaw, arms, back) ?    yes ?3. ONSET: "When did the chest pain begin?" (Minutes, hours or days)  ?    1 week ago - and now today as well ?4. PATTERN "Does the pain come and go, or has it been constant since it started?"  "Does it get worse with exertion?"  ?    Come and go ?5. DURATION: "How long does it last" (e.g., seconds, minutes, hours) ?    hours ?6. SEVERITY: "How bad is the pain?"  (e.g., Scale 1-10; mild, moderate, or severe) ?   - MILD (1-3): doesn't interfere with normal activities  ?   - MODERATE (4-7): interferes with normal activities or awakens from sleep ?   - SEVERE (8-10): excruciating pain, unable to do any normal activities   ?    moderate ?7. CARDIAC RISK FACTORS: "Do you have any history of heart problems or risk factors for heart disease?" (e.g., angina, prior heart attack; diabetes, high blood pressure, high cholesterol,  smoker, or strong family history of heart disease) ?    Yes - was supposed to wear a monitor ?8. PULMONARY RISK FACTORS: "Do you have any history of lung disease?"  (e.g., blood clots in lung, asthma, emphysema, birth control pills) ?    no ?9. CAUSE: "What do you think is causing the chest pain?" ?    Unknown - perhaps stress ?10. OTHER SYMPTOMS: "Do you have any other symptoms?" (e.g., dizziness, nausea, vomiting, sweating, fever, difficulty breathing, cough) ?      Stomach pain ?11. PREGNANCY: "Is there any chance you are pregnant?" "When was your last menstrual period?" ? ?Protocols used: Chest Pain-A-AH ? ?

## 2022-05-17 ENCOUNTER — Encounter: Payer: Self-pay | Admitting: Physician Assistant

## 2022-05-17 ENCOUNTER — Ambulatory Visit: Payer: MEDICAID | Attending: Registered Nurse | Admitting: Physician Assistant

## 2022-05-17 VITALS — BP 113/77 | HR 77 | Wt 142.2 lb

## 2022-05-17 DIAGNOSIS — Z131 Encounter for screening for diabetes mellitus: Secondary | ICD-10-CM

## 2022-05-17 DIAGNOSIS — Z13220 Encounter for screening for lipoid disorders: Secondary | ICD-10-CM

## 2022-05-17 DIAGNOSIS — O9279 Other disorders of lactation: Secondary | ICD-10-CM

## 2022-05-17 DIAGNOSIS — K120 Recurrent oral aphthae: Secondary | ICD-10-CM

## 2022-05-17 NOTE — Patient Instructions (Addendum)
[facility]: [address] in [address] at the corners of [employer] and [address].   [phone].    Aphthous ulcers or canker sores are very common oral mucosal lesions. In most people, aphthous ulcers often take 1-3 weeks to resolve.  Salt water gargles 4 times daily.  Continue tylenol or advil for discomfort.    Gargling with salt water has been shown to ease pain and inflammation from a sore throat. The home remedy may also reduce harmful bacteria in your mouth, lowering the risk of cavities and gingivitis. Experts suggest a simple solution -- with 1-2 tsp of salt and 8 oz of warm water -- for salt water gargles.   Canker Sores  Canker sores are small, painful sores that develop inside the mouth. You can get one or more canker sores on the inside of your lips or cheeks, on your tongue, or anywhere inside your mouth. Canker sores cannot be passed from person to person (are not contagious). These sores are different from the sores that you may get on the outside of your lips (cold sores or fever blisters). What are the causes? The cause of this condition is not known. The condition may be passed down from a parent (genetic). What increases the risk? This condition is more likely to develop in: Females. People in their teens or 24s. Females who are having their menstrual period. People who are under a lot of emotional stress. People who do not get enough iron or B vitamins. People who do not take care of their mouth and teeth (have poor oral hygiene). People who have an injury inside the mouth, such as after having dental work or from chewing something hard. What are the signs or symptoms? Canker sores usually start as painful red bumps. Then they turn into small white, yellow, or gray sores that have red borders. The sores may be painful, and the pain may get worse when you eat or drink. In severe cases, along with the canker sore, symptoms may also  include: Fever. Tiredness (fatigue). Swollen lymph nodes in your neck. How is this diagnosed? This condition may be diagnosed based on your symptoms and an exam of the inside of your mouth. If you get canker sores often or if they are very bad, you may have tests, such as: Blood tests to rule out possible causes. Swabbing a fluid sample from the sore to be tested for infection. Removing a small tissue sample from the sore (biopsy). How is this treated? Most canker sores go away without treatment in about 1 week. Home care is usually the only treatment that you will need. Over-the-counter medicines can relieve discomfort. If you have severe canker sores, your health care provider may prescribe: Numbing ointment to relieve pain. Do not use products that contain benzocaine (including numbing gels) to treat teething or mouth pain in children who are younger than 2 years. These products may cause a rare but serious blood condition. Vitamins. Steroid medicines. These may be given as pills, mouth rinses, or gels. Antibiotic mouth rinse. Follow these instructions at home:  Apply, take, or use over-the-counter and prescription medicines only as told by your health care provider. These include vitamins and ointments. If you were prescribed an antibiotic mouth rinse, use it as told by your health care provider. Do not stop using the antibiotic even if your condition improves. Until the sores are healed: Do not drink coffee or citrus juices. Do not eat spicy or salty  foods. Use a mild, over-the-counter mouth rinse as recommended by your health care provider. Take good care of your mouth and teeth (oral hygiene) by: Flossing your teeth every day. Brushing your teeth with a soft toothbrush twice each day. Contact a health care provider if: Your symptoms do not get better after 2 weeks. You also have a fever or swollen glands in your neck. You get canker sores often. You have a canker sore that is  getting larger. You cannot eat or drink due to your canker sores. Summary Canker sores are small, painful sores that develop inside the mouth. Canker sores usually start as painful red bumps that turn into small white, yellow, or gray sores that have red borders. The sores may be painful, and the pain may get worse when you eat or drink. Most canker sores clear up without treatment in about 1 week. Over-the-counter medicines can relieve discomfort. This information is not intended to replace advice given to you by your health care provider. Make sure you discuss any questions you have with your health care provider. Document Revised: 08/10/2021 Document Reviewed: 08/10/2021 Elsevier Patient Education  2023 [employer].

## 2022-05-17 NOTE — Progress Notes (Signed)
Patient ID: Belinda Mcintosh, female   DOB: 04/20/1981, 41 y.o.   MRN: [id number]   Charnette Younkin, is a 41 y.o. female  BSJ:[id number]  TML:[id number]  DOB - Oct 14, 1981  Chief Complaint  Patient presents with   Sore Throat       Subjective:   Belinda Mcintosh is a 41 y.o. female here today for mouth sores and ST. No fever, runny nose, or cough.  No N/V.  Sores present for 3 days.  She took amoxicillin at home and it did not help.    Wants to get checked for DM and cholesterol  Decrease in breast milk over the last month.  R breast not producing milk.  L breast still producing about 4 oz every 4 months.  Her infant is 64 months old.  [facility] by [facility] for gyn care but thinks they moved office and does not know where they are now.  She is pumping.    No problems updated.  ALLERGIES: Allergies  Allergen Reactions   Shrimp [Shellfish Allergy] Itching    PAST MEDICAL HISTORY: Past Medical History:  Diagnosis Date   Abdominal pain    Flank pain    History of kidney stones    Low back pain    Palpitations    Umbilical hernia 2018    MEDICATIONS AT HOME: Prior to Admission medications   Medication Sig Start Date End Date Taking? Authorizing Provider  acetaminophen (TYLENOL) 500 MG tablet Take 2 tablets (1,000 mg total) by mouth every 8 (eight) hours as needed (pain, headache, or fever). 08/22/21  Yes Worthy Rancher, MD  ibuprofen (ADVIL) 600 MG tablet Take 1 tablet (600 mg total) by mouth every 6 (six) hours as needed (pain). 08/22/21  Yes Worthy Rancher, MD  lidocaine (XYLOCAINE) 5 % ointment Apply 1 application topically as needed. Patient not taking: Reported on 05/17/2022 11/10/21   Milas Hock, MD  pantoprazole (PROTONIX) 20 MG tablet Take 1 tablet (20 mg total) by mouth daily. Patient not taking: Reported on 10/04/2021 04/22/21   Reva Bores, MD  polyethylene glycol powder (GLYCOLAX/MIRALAX) 17  GM/SCOOP powder Take 17 g by mouth daily. One capful daily Patient not taking: Reported on 10/14/2021 10/04/21   Allayne Stack, DO  Prenatal Vit-Fe Fumarate-FA (PRENATAL MULTIVITAMIN) TABS tablet Take 1 tablet by mouth daily at 12 noon. Patient not taking: Reported on 11/08/2021    [provider]    ROS: Neg HEENT Neg resp Neg cardiac Neg GI Neg GU Neg MS Neg psych Neg neuro  Objective:   Vitals:   05/17/22 1028  BP: 113/77  Pulse: 77  SpO2: 97%  Weight: 142 lb 3.2 oz (64.5 kg)   Exam General appearance : Awake, alert, not in any distress. Speech Clear. Not toxic looking HEENT: Atraumatic and Normocephalic.  Apthous ulcers present in buccal mucosa.  Throat with erythema but no exudate.   Neck: Supple, no JVD. No cervical lymphadenopathy.  Chest: Good air entry bilaterally, CTAB.  No rales/rhonchi/wheezing CVS: S1 S2 regular, no murmurs.  Extremities: B/L Lower Ext shows no edema, both legs are warm to touch Neurology: Awake alert, and oriented X 3, CN II-XII intact, Non focal Skin: No Rash  Data Review Lab Results  Component Value Date   HGBA1C 5.0 02/22/2021    Assessment & Plan   1. Screening cholesterol level - Lipid panel  2. Screening for diabetes mellitus I have  had a lengthy discussion and provided education about insulin resistance and the intake of too much sugar/refined carbohydrates.  I have advised the patient to work at a goal of eliminating sugary drinks, candy, desserts, sweets, refined sugars, processed foods, and white carbohydrates.  The patient expresses understanding.  - Hemoglobin A1c  3. Interruption in breast feeding Increase water intake.  Pump both breasts every 4 hours and make appt with women's center.  Information given on AVS  4. Aphthous ulcer See AVS.  Salt water gargles/tylenol/ibu.      Return in about 2 months (around 07/18/2022) for assign PCP.  The patient was given clear instructions to go to ER or return to  medical center if symptoms don't improve, worsen or new problems develop. The patient verbalized understanding. The patient was told to call to get lab results if they haven't heard anything in the next week.      Georgian Co, PA-C [facility] [place], [place] [phone]   05/17/2022, 10:50 AM

## 2022-05-18 ENCOUNTER — Encounter (HOSPITAL_COMMUNITY): Payer: Self-pay

## 2022-05-18 ENCOUNTER — Emergency Department (HOSPITAL_COMMUNITY)
Admission: EM | Admit: 2022-05-18 | Discharge: 2022-05-18 | Disposition: A | Discharge status: Home / Self Care | Payer: Commercial Managed Care - HMO | Attending: Emergency Medicine | Admitting: Emergency Medicine

## 2022-05-18 ENCOUNTER — Other Ambulatory Visit: Payer: Self-pay

## 2022-05-18 ENCOUNTER — Other Ambulatory Visit: Payer: Self-pay | Admitting: Obstetrics and Gynecology

## 2022-05-18 ENCOUNTER — Other Ambulatory Visit: Payer: Self-pay | Admitting: Internal Medicine

## 2022-05-18 DIAGNOSIS — N898 Other specified noninflammatory disorders of vagina: Secondary | ICD-10-CM

## 2022-05-18 DIAGNOSIS — J029 Acute pharyngitis, unspecified: Secondary | ICD-10-CM | POA: Diagnosis present

## 2022-05-18 LAB — LIPID PANEL
Chol/HDL Ratio: 2.9 ratio (ref 0.0–4.4)
Cholesterol, Total: 123 mg/dL (ref 100–199)
HDL: 43 mg/dL (ref >39)
LDL Chol Calc (NIH): 67 mg/dL (ref 0–99)
Triglycerides: 63 mg/dL (ref 0–149)
VLDL Cholesterol Cal: 13 mg/dL (ref 5–40)

## 2022-05-18 LAB — GROUP A STREP BY PCR: Group A Strep by PCR: NOT DETECTED

## 2022-05-18 LAB — HEMOGLOBIN A1C
Est. average glucose Bld gHb Est-mCnc: 108 mg/dL
Hgb A1c MFr Bld: 5.4 % (ref 4.8–5.6)

## 2022-05-18 MED ORDER — LIDOCAINE VISCOUS HCL 2 % MT SOLN: 15.0000 mL | OROMUCOSAL | 0 refills | Status: DC | PRN

## 2022-05-18 MED ORDER — PHENOL 1.4 % MT LIQD: 1.0000 | OROMUCOSAL | Status: DC | PRN

## 2022-05-18 NOTE — ED Triage Notes (Signed)
Pt presents to ED from home with c/o sore throat x 4 days. Pt states she has been taking ibuprofen and tylenol but it has not helped the pain. Denies fever, congestion.

## 2022-05-18 NOTE — ED Provider Notes (Signed)
[facility]-EMERGENCY DEPT Provider Note   CSN: [id number] Arrival date & time: 05/18/22  0510     History  Chief Complaint  Patient presents with   Sore Throat    Belinda Mcintosh is a 41 y.o. female.  Patient presents to the emergency department from home complaining of a sore throat times past 4 days.  Patient also complains of aphthous ulcers in her mouth.  Patient was evaluated yesterday at [facility] wellness.  Patient states that she has been taking ibuprofen and Tylenol but it does not help the pain.  Patient denies fevers, congestion, other symptoms that would be consistent with upper respiratory infection.  Patient also denies nausea, vomiting, abdominal symptoms, shortness of breath, cough.  Patient with history of GERD, anxiety  HPI     Home Medications Prior to Admission medications   Medication Sig Start Date End Date Taking? Authorizing Provider  lidocaine (XYLOCAINE) 2 % solution Use as directed 15 mLs in the mouth or throat as needed for mouth pain. 05/18/22  Yes Darrick Grinder, PA-C  acetaminophen (TYLENOL) 500 MG tablet Take 2 tablets (1,000 mg total) by mouth every 8 (eight) hours as needed (pain, headache, or fever). 08/22/21   Worthy Rancher, MD  ibuprofen (ADVIL) 600 MG tablet Take 1 tablet (600 mg total) by mouth every 6 (six) hours as needed (pain). 08/22/21   Worthy Rancher, MD  lidocaine (XYLOCAINE) 5 % ointment Apply 1 application topically as needed. Patient not taking: Reported on 05/17/2022 11/10/21   Milas Hock, MD  pantoprazole (PROTONIX) 20 MG tablet Take 1 tablet (20 mg total) by mouth daily. Patient not taking: Reported on 10/04/2021 04/22/21   Reva Bores, MD  polyethylene glycol powder (GLYCOLAX/MIRALAX) 17 GM/SCOOP powder Take 17 g by mouth daily. One capful daily Patient not taking: Reported on 10/14/2021 10/04/21   Allayne Stack, DO  Prenatal Vit-Fe Fumarate-FA (PRENATAL MULTIVITAMIN)  TABS tablet Take 1 tablet by mouth daily at 12 noon. Patient not taking: Reported on 11/08/2021    [provider]      Allergies    Shrimp [shellfish allergy]    Review of Systems   Review of Systems  Constitutional:  Negative for fever.  HENT:  Positive for sore throat. Negative for congestion, sinus pain, trouble swallowing and voice change.   Respiratory:  Negative for cough and shortness of breath.   Cardiovascular:  Negative for chest pain.  Gastrointestinal:  Negative for abdominal pain, nausea and vomiting.    Physical Exam Updated Vital Signs BP 113/73 (BP Location: Left Arm)   Pulse 67   Temp 98.7 F (37.1 C) (Oral)   Resp 15   SpO2 96%  Physical Exam Vitals and nursing note reviewed.  Constitutional:      General: She is not in acute distress.    Appearance: She is normal weight.  HENT:     Head: Normocephalic and atraumatic.     Nose: No congestion or rhinorrhea.     Mouth/Throat:     Mouth: Mucous membranes are moist. Oral lesions (Apthous ulcers) present.     Pharynx: Uvula midline. Posterior oropharyngeal erythema (Mildly erythematous oropharynx) present. No oropharyngeal exudate or uvula swelling.     Tonsils: No tonsillar exudate or tonsillar abscesses.  Eyes:     Conjunctiva/sclera: Conjunctivae normal.  Cardiovascular:     Rate and Rhythm: Normal rate and regular rhythm.     Heart sounds: Normal heart sounds.  Pulmonary:     Effort: Pulmonary effort is normal.     Breath sounds: Normal breath sounds.  Musculoskeletal:     Cervical back: Normal range of motion and neck supple.  Lymphadenopathy:     Cervical: No cervical adenopathy.  Skin:    General: Skin is warm and dry.     Capillary Refill: Capillary refill takes less than 2 seconds.  Neurological:     Mental Status: She is alert and oriented to person, place, and time.     ED Results / Procedures / Treatments   Labs (all labs ordered are listed, but only abnormal results are  displayed) Labs Reviewed  GROUP A STREP BY PCR    EKG None  Radiology No results found.  Procedures Procedures    Medications Ordered in ED Medications - No data to display   ED Course/ Medical Decision Making/ A&P                           Medical Decision Making Risk OTC drugs.   Patient presents with a chief complaint of sore throat.  Differential includes but is not limited to strep throat, viral illness, peritonsillar abscess, and others  The patient has a midline uvula and no lingual deviation.  Normal voice.  Very low clinical suspicion for any sort of peritonsillar abscess  I ordered testing including group A strep.  Test was negative.  Considered treating patient for strep with antibiotics but patient's history is not consistent with strep at this time.  No tonsillar exudate, no swelling in the cervical nodes.  This is likely a viral illness and will need to run its course.  Patient will need to continue with Tylenol and Advil as tolerated.  She may She may use either Chloraseptic spray or other topical spray as needed. I will also prescribe viscous lidocaine. The patient should follow up with her PCP as needed.           Final Clinical Impression(s) / ED Diagnoses Final diagnoses:  Sore throat    Rx / DC Orders ED Discharge Orders          Ordered    lidocaine (XYLOCAINE) 2 % solution  As needed        05/18/22 0820              Darrick Grinder, PA-C 05/18/22 0174    Glynn Octave, MD 05/18/22 [phone]

## 2022-05-18 NOTE — Discharge Instructions (Addendum)
You were seen today for sore throat.  Your strep testing was negative.  This is likely viral in nature.  Continue to use over-the-counter medicines including NyQuil or DayQuil. Do not exceed levels listed on the bottles. Do not exceed 3000mg  Tylenol daily from all sources, this includes NyQuil and DayQuil.  You may also use Advil as needed but do not exceed 600 mg at any given time.  You may take that up to 3 times daily.  I have prescribed a lidocaine solution to swish to help with mouth and throat pain.  You may buy Chloraseptic spray over-the-counter.  Please follow-up with primary care provider as needed

## 2022-05-19 ENCOUNTER — Ambulatory Visit: Payer: Self-pay | Admitting: *Deleted

## 2022-05-19 NOTE — Telephone Encounter (Signed)
  Chief Complaint: earache, sore throat blood in sputum this morning. Symptoms: Right ear hurting, sore throat on right side runny nose, and coughing Frequency: Since Monday Pertinent Negatives: Patient denies n/A Disposition: [] ED /Pt Urgent Care (no appt availability in office) / [] Appointment(In office/virtual)/ []  [facility]/ [] Home Care/ [] Refused Recommended Disposition /[] [facility]/ []  Follow-up with PCP Additional Notes: She decided to go to the urgent care since there are no appts with [facility].   [facility] [place] [facility] doesn't run on Fridays.

## 2022-05-19 NOTE — Telephone Encounter (Signed)
Pt called in and either hung up or the line disconnected prior to connecting with her. I called her back. Reason for Disposition  Earache  (Exceptions: brief ear pain of < 60 minutes duration, earache occurring during air travel  Answer Assessment - Initial Assessment Questions 1. LOCATION: "Which ear is involved?"     My ear is hurting.  I went to dr. At [facility] and seen at ED too for this.  I have an ear infection and sore throat.   I'm bleeding from my throat.  It started this morning.   The mucus I'm coughing up has blood in it.     Doctors told me to gargle with salt water and use ibuprofen.   They tested negative for Strep.      My mucus smells bad.  2. ONSET: "When did the ear start hurting"      Right ear.  It hurts in my right throat.   3. SEVERITY: "How bad is the pain?"  (Scale 1-10; mild, moderate or severe)   - MILD (1-3): doesn't interfere with normal activities    - MODERATE (4-7): interferes with normal activities or awakens from sleep    - SEVERE (8-10): excruciating pain, unable to do any normal activities      I would like to see a doctor to see if I have an ear infection or if I need an antibiotic.    4. URI SYMPTOMS: "Do you have a runny nose or cough?"     I have a runny nose and coughing.  It's stuffy. Been sick since Monday.   I'm getting worse even after seeing 2 doctors. 5. FEVER: "Do you have a fever?" If Yes, ask: "What is your temperature, how was it measured, and when did it start?"     I don't know.   I'm taking ibuprofen and Tylenol so I'm not sure. 6. CAUSE: "Have you been swimming recently?", "How often do you use Q-TIPS?", "Have you had any recent air travel or scuba diving?"     N/A 7. OTHER SYMPTOMS: "Do you have any other symptoms?" (e.g., headache, stiff neck, dizziness, vomiting, runny nose, decreased hearing)     I had a headache yesterday.  I feel hot. 8. PREGNANCY: "Is there any chance you are pregnant?" "When was your last menstrual period?"      No  Protocols used: Davina Poke

## 2022-05-31 ENCOUNTER — Ambulatory Visit: Payer: Self-pay | Admitting: Physician Assistant

## 2022-06-16 ENCOUNTER — Encounter: Payer: Self-pay | Admitting: Family Medicine

## 2023-08-20 ENCOUNTER — Emergency Department (HOSPITAL_COMMUNITY): Payer: Commercial Managed Care - HMO

## 2023-08-20 ENCOUNTER — Other Ambulatory Visit: Payer: Self-pay

## 2023-08-20 ENCOUNTER — Encounter (HOSPITAL_COMMUNITY): Payer: Self-pay

## 2023-08-20 ENCOUNTER — Observation Stay (HOSPITAL_COMMUNITY)
Admission: EM | Admit: 2023-08-20 | Discharge: 2023-08-21 | Disposition: A | Discharge status: Home / Self Care | Payer: Commercial Managed Care - HMO | Attending: Surgery | Admitting: Surgery

## 2023-08-20 DIAGNOSIS — T508X5A Adverse effect of diagnostic agents, initial encounter: Secondary | ICD-10-CM | POA: Insufficient documentation

## 2023-08-20 DIAGNOSIS — R1031 Right lower quadrant pain: Secondary | ICD-10-CM | POA: Diagnosis present

## 2023-08-20 DIAGNOSIS — Z79899 Other long term (current) drug therapy: Secondary | ICD-10-CM | POA: Diagnosis not present

## 2023-08-20 DIAGNOSIS — K3580 Unspecified acute appendicitis: Secondary | ICD-10-CM

## 2023-08-20 DIAGNOSIS — Z87891 Personal history of nicotine dependence: Secondary | ICD-10-CM | POA: Insufficient documentation

## 2023-08-20 DIAGNOSIS — R109 Unspecified abdominal pain: Principal | ICD-10-CM

## 2023-08-20 HISTORY — DX: Unspecified acute appendicitis: K35.80

## 2023-08-20 LAB — URINALYSIS, ROUTINE W REFLEX MICROSCOPIC
Bacteria, UA: NONE SEEN
Bilirubin Urine: NEGATIVE
Glucose, UA: NEGATIVE mg/dL
Hgb urine dipstick: NEGATIVE
Ketones, ur: NEGATIVE mg/dL
Nitrite: NEGATIVE
Protein, ur: NEGATIVE mg/dL
Specific Gravity, Urine: 1.044 — ABNORMAL HIGH (ref 1.005–1.030)
pH: 6 (ref 5.0–8.0)

## 2023-08-20 LAB — CBC WITH DIFFERENTIAL/PLATELET
Abs Immature Granulocytes: 0.01 10*3/uL (ref 0.00–0.07)
Basophils Absolute: 0 10*3/uL (ref 0.0–0.1)
Basophils Relative: 0 %
Eosinophils Absolute: 0.1 10*3/uL (ref 0.0–0.5)
Eosinophils Relative: 1 %
HCT: 39.9 % (ref 36.0–46.0)
Hemoglobin: 13.2 g/dL (ref 12.0–15.0)
Immature Granulocytes: 0 %
Lymphocytes Relative: 33 %
Lymphs Abs: 2.6 10*3/uL (ref 0.7–4.0)
MCH: 29.8 pg (ref 26.0–34.0)
MCHC: 33.1 g/dL (ref 30.0–36.0)
MCV: 90.1 fL (ref 80.0–100.0)
Monocytes Absolute: 0.4 10*3/uL (ref 0.1–1.0)
Monocytes Relative: 5 %
Neutro Abs: 4.8 10*3/uL (ref 1.7–7.7)
Neutrophils Relative %: 61 %
Platelets: 139 10*3/uL — ABNORMAL LOW (ref 150–400)
RBC: 4.43 MIL/uL (ref 3.87–5.11)
RDW: 12.9 % (ref 11.5–15.5)
WBC: 7.9 10*3/uL (ref 4.0–10.5)
nRBC: 0 % (ref 0.0–0.2)

## 2023-08-20 LAB — COMPREHENSIVE METABOLIC PANEL
ALT: 24 U/L (ref 0–44)
AST: 15 U/L (ref 15–41)
Albumin: 4.1 g/dL (ref 3.5–5.0)
Alkaline Phosphatase: 50 U/L (ref 38–126)
Anion gap: 7 (ref 5–15)
BUN: 17 mg/dL (ref 6–20)
CO2: 22 mmol/L (ref 22–32)
Calcium: 8.6 mg/dL — ABNORMAL LOW (ref 8.9–10.3)
Chloride: 106 mmol/L (ref 98–111)
Creatinine, Ser: 0.89 mg/dL (ref 0.44–1.00)
GFR, Estimated: >60 mL/min (ref >60)
Glucose, Bld: 93 mg/dL (ref 70–99)
Potassium: 3.5 mmol/L (ref 3.5–5.1)
Sodium: 135 mmol/L (ref 135–145)
Total Bilirubin: 0.5 mg/dL (ref 0.3–1.2)
Total Protein: 7.7 g/dL (ref 6.5–8.1)

## 2023-08-20 LAB — LIPASE, BLOOD: Lipase: 28 U/L (ref 11–51)

## 2023-08-20 LAB — I-STAT CHEM 8, ED
BUN: 16 mg/dL (ref 6–20)
Calcium, Ion: 1.2 mmol/L (ref 1.15–1.40)
Chloride: 105 mmol/L (ref 98–111)
Creatinine, Ser: 0.9 mg/dL (ref 0.44–1.00)
Glucose, Bld: 91 mg/dL (ref 70–99)
HCT: 40 % (ref 36.0–46.0)
Hemoglobin: 13.6 g/dL (ref 12.0–15.0)
Potassium: 3.6 mmol/L (ref 3.5–5.1)
Sodium: 139 mmol/L (ref 135–145)
TCO2: 21 mmol/L — ABNORMAL LOW (ref 22–32)

## 2023-08-20 LAB — HCG, SERUM, QUALITATIVE: Preg, Serum: NEGATIVE

## 2023-08-20 MED ORDER — ACETAMINOPHEN 325 MG PO TABS
650.0000 mg | ORAL_TABLET | Freq: Four times a day (QID) | ORAL | Status: DC | PRN
  Administered 2023-08-21: 650 mg via ORAL
  Filled 2023-08-20: qty 2

## 2023-08-20 MED ORDER — DIPHENHYDRAMINE HCL 25 MG PO CAPS: 25.0000 mg | ORAL_CAPSULE | Freq: Four times a day (QID) | ORAL | Status: DC | PRN

## 2023-08-20 MED ORDER — CIPROFLOXACIN IN D5W 400 MG/200ML IV SOLN
400.0000 mg | Freq: Two times a day (BID) | INTRAVENOUS | Status: DC
  Administered 2023-08-21 (×2): 400 mg via INTRAVENOUS
  Filled 2023-08-20 (×2): qty 200

## 2023-08-20 MED ORDER — SODIUM CHLORIDE 0.9 % IV SOLN
2.0000 g | INTRAVENOUS | Status: DC
  Administered 2023-08-20: 2 g via INTRAVENOUS
  Filled 2023-08-20: qty 20

## 2023-08-20 MED ORDER — KCL IN DEXTROSE-NACL 20-5-0.45 MEQ/L-%-% IV SOLN
INTRAVENOUS | Status: DC
  Filled 2023-08-20: qty 1000

## 2023-08-20 MED ORDER — MORPHINE SULFATE (PF) 2 MG/ML IV SOLN: 2.0000 mg | INTRAVENOUS | Status: DC | PRN

## 2023-08-20 MED ORDER — DIPHENHYDRAMINE HCL 50 MG/ML IJ SOLN
25.0000 mg | Freq: Four times a day (QID) | INTRAMUSCULAR | Status: DC | PRN
  Administered 2023-08-21: 25 mg via INTRAVENOUS
  Filled 2023-08-20: qty 1

## 2023-08-20 MED ORDER — DEXAMETHASONE SODIUM PHOSPHATE 10 MG/ML IJ SOLN
10.0000 mg | Freq: Once | INTRAMUSCULAR | Status: AC
  Administered 2023-08-20: 10 mg via INTRAVENOUS
  Filled 2023-08-20 (×2): qty 1

## 2023-08-20 MED ORDER — POLYETHYLENE GLYCOL 3350 17 G PO PACK: 17.0000 g | PACK | Freq: Every day | ORAL | Status: DC | PRN

## 2023-08-20 MED ORDER — ONDANSETRON HCL 4 MG/2ML IJ SOLN: 4.0000 mg | Freq: Four times a day (QID) | INTRAMUSCULAR | Status: DC | PRN

## 2023-08-20 MED ORDER — ONDANSETRON 4 MG PO TBDP: 4.0000 mg | ORAL_TABLET | Freq: Four times a day (QID) | ORAL | Status: DC | PRN

## 2023-08-20 MED ORDER — DIPHENHYDRAMINE HCL 50 MG/ML IJ SOLN
25.0000 mg | Freq: Once | INTRAMUSCULAR | Status: AC
  Administered 2023-08-20: 25 mg via INTRAVENOUS
  Filled 2023-08-20: qty 1

## 2023-08-20 MED ORDER — EPINEPHRINE 0.3 MG/0.3ML IJ SOAJ
0.3000 mg | Freq: Once | INTRAMUSCULAR | Status: DC
  Filled 2023-08-20: qty 0.3

## 2023-08-20 MED ORDER — IOHEXOL 300 MG/ML  SOLN
100.0000 mL | Freq: Once | INTRAMUSCULAR | Status: AC | PRN
  Administered 2023-08-20: 100 mL via INTRAVENOUS

## 2023-08-20 MED ORDER — ACETAMINOPHEN 650 MG RE SUPP: 650.0000 mg | Freq: Four times a day (QID) | RECTAL | Status: DC | PRN

## 2023-08-20 MED ORDER — OXYCODONE HCL 5 MG PO TABS
5.0000 mg | ORAL_TABLET | Freq: Four times a day (QID) | ORAL | Status: DC | PRN
  Filled 2023-08-20: qty 1

## 2023-08-20 MED ORDER — METOPROLOL TARTRATE 5 MG/5ML IV SOLN: 5.0000 mg | Freq: Four times a day (QID) | INTRAVENOUS | Status: DC | PRN

## 2023-08-20 MED ORDER — FAMOTIDINE IN NACL 20-0.9 MG/50ML-% IV SOLN
20.0000 mg | Freq: Once | INTRAVENOUS | Status: AC
  Administered 2023-08-20: 20 mg via INTRAVENOUS
  Filled 2023-08-20: qty 50

## 2023-08-20 MED ORDER — ALBUTEROL SULFATE (2.5 MG/3ML) 0.083% IN NEBU
2.5000 mg | INHALATION_SOLUTION | Freq: Once | RESPIRATORY_TRACT | Status: AC
  Administered 2023-08-20: 2.5 mg via RESPIRATORY_TRACT
  Filled 2023-08-20: qty 3

## 2023-08-20 MED ORDER — METRONIDAZOLE 500 MG/100ML IV SOLN
500.0000 mg | Freq: Two times a day (BID) | INTRAVENOUS | Status: DC
  Administered 2023-08-21 (×2): 500 mg via INTRAVENOUS
  Filled 2023-08-20 (×2): qty 100

## 2023-08-20 MED ORDER — ENOXAPARIN SODIUM 40 MG/0.4ML IJ SOSY
40.0000 mg | PREFILLED_SYRINGE | INTRAMUSCULAR | Status: DC
  Administered 2023-08-20: 40 mg via SUBCUTANEOUS
  Filled 2023-08-20: qty 0.4

## 2023-08-20 NOTE — ED Provider Triage Note (Signed)
Emergency Medicine Provider Triage Evaluation Note  Belinda Mcintosh , a 42 y.o. female  was evaluated in triage.  Pt complains of right lower quadrant abdominal pain.  Patient has been having pain in her abdomen for about 2 weeks.  She was seen at an outpatient setting and had a noncontrast CT abdomen pelvis which showed elongated appendix with fat stranding and haziness surrounding it nonspecific but concerning for potential appendicitis and was sent in for further evaluation..  Review of Systems  Positive: Abdominal pain Negative: Fever  Physical Exam  BP 124/79 (BP Location: Left Arm)   Pulse 74   Temp 97.9 F (36.6 C) (Oral)   Resp 16   Ht 5\' 1"  (1.549 m)   Wt 64.5 kg   SpO2 94%   BMI 26.87 kg/m  Gen:   Awake, no distress   Resp:  Normal effort  MSK:   Moves extremities without difficulty  Other:  Right lower quadrant abdominal guarding and tenderness on exam  Medical Decision Making  Medically screening exam initiated at 8:47 PM.  Appropriate orders placed.  Belinda Mcintosh was informed that the remainder of the evaluation will be completed by another provider, this initial triage assessment does not replace that evaluation, and the importance of remaining in the ED until their evaluation is complete.  Workup initiated   Belinda Captain, PA-C 08/20/23 2048

## 2023-08-20 NOTE — ED Notes (Signed)
Shortly after starting rocephin, pt called out and she had developed hives on her face and itching. [facility] notified, abx were stopped, medication changed.

## 2023-08-20 NOTE — ED Provider Notes (Signed)
[place] [facility]ENC[facility] DE[facility]MENT AT [facility] Provider Note   CSN: [id number] Arrival date & time: 08/20/23  2952     History  Chief Complaint  Patient presents with   Abdominal Pain    Belinda Mcintosh is a 42 y.o. female.   Abdominal Pain    Patient presented to the ED with complaints of right lower abdominal pain.  Patient had been having symptoms for the last 2 weeks.  Patient had an outpatient noncontrast CT scan that suggested fat stranding and haziness concerning for possible appendicitis.  Patient was in the emergency room receiving a contrasted CT scan.  After the study she started developing itching all over a cough a sensation of fullness in her throat.  Home Medications Prior to Admission medications   Medication Sig Start Date End Date Taking? Authorizing Provider  acetaminophen (TYLENOL) 500 MG tablet Take 2 tablets (1,000 mg total) by mouth every 8 (eight) hours as needed (pain, headache, or fever). 08/22/21   Worthy Rancher, MD  ibuprofen (ADVIL) 600 MG tablet Take 1 tablet (600 mg total) by mouth every 6 (six) hours as needed (pain). 08/22/21   Worthy Rancher, MD  ipratropium (ATROVENT) 0.03 % nasal spray SPRAY 1 SPRAY INTO BOTH NOSTRILS DAILY. 05/18/22   Charlott Holler, MD  lidocaine (XYLOCAINE) 2 % solution Use as directed 15 mLs in the mouth or throat as needed for mouth pain. 05/18/22   Barrie Dunker B, PA-C  lidocaine (XYLOCAINE) 5 % ointment Apply 1 application topically as needed. Patient not taking: Reported on 05/17/2022 11/10/21   Milas Hock, MD  pantoprazole (PROTONIX) 20 MG tablet Take 1 tablet (20 mg total) by mouth daily. Patient not taking: Reported on 10/04/2021 04/22/21   Reva Bores, MD  polyethylene glycol powder (GLYCOLAX/MIRALAX) 17 GM/SCOOP powder Take 17 g by mouth daily. One capful daily Patient not taking: Reported on 10/14/2021 10/04/21   Allayne Stack, DO  Prenatal Vit-Fe Fumarate-FA (PRENATAL MULTIVITAMIN) TABS  tablet Take 1 tablet by mouth daily at 12 noon. Patient not taking: Reported on 11/08/2021    [provider]      Allergies    Ivp dye [iodinated contrast media] and Shrimp [shellfish allergy]    Review of Systems   Review of Systems  Gastrointestinal:  Positive for abdominal pain.    Physical Exam Updated Vital Signs BP 121/66   Pulse 82   Temp 98.4 F (36.9 C) (Oral)   Resp 19   Ht 1.549 m (5\' 1" )   Wt 64.5 kg   SpO2 97%   BMI 26.87 kg/m  Physical Exam Vitals and nursing note reviewed.  Constitutional:      General: She is not in acute distress.    Appearance: She is well-developed.  HENT:     Head: Normocephalic and atraumatic.     Right Ear: External ear normal.     Left Ear: External ear normal.  Eyes:     General: No scleral icterus.       Right eye: No discharge.        Left eye: No discharge.     Conjunctiva/sclera: Conjunctivae normal.  Neck:     Trachea: No tracheal deviation.  Cardiovascular:     Rate and Rhythm: Normal rate and regular rhythm.  Pulmonary:     Effort: Pulmonary effort is normal. No respiratory distress.     Breath sounds: Normal breath sounds. No stridor. No wheezing or rales.  Abdominal:  General: Bowel sounds are normal. There is no distension.     Palpations: Abdomen is soft.     Tenderness: There is abdominal tenderness in the right lower quadrant. There is no guarding or rebound.  Musculoskeletal:        General: No tenderness or deformity.     Cervical back: Neck supple.  Skin:    General: Skin is warm and dry.     Findings: Rash present. Rash is urticarial.  Neurological:     General: No focal deficit present.     Mental Status: She is alert.     Cranial Nerves: No cranial nerve deficit, dysarthria or facial asymmetry.     Sensory: No sensory deficit.     Motor: No abnormal muscle tone or seizure activity.     Coordination: Coordination normal.  Psychiatric:        Mood and Affect: Mood normal.     ED  Results / Procedures / Treatments   Labs (all labs ordered are listed, but only abnormal results are displayed) Labs Reviewed  CBC WITH DIFFERENTIAL/PLATELET - Abnormal; Notable for the following components:      Result Value   Platelets 139 (*)    All other components within normal limits  COMPREHENSIVE METABOLIC PANEL - Abnormal; Notable for the following components:   Calcium 8.6 (*)    All other components within normal limits  URINALYSIS, ROUTINE W REFLEX MICROSCOPIC - Abnormal; Notable for the following components:   Color, Urine COLORLESS (*)    Specific Gravity, Urine 1.044 (*)    Leukocytes,Ua MODERATE (*)    All other components within normal limits  I-STAT CHEM 8, ED - Abnormal; Notable for the following components:   TCO2 21 (*)    All other components within normal limits  LIPASE, BLOOD  HCG, SERUM, QUALITATIVE  HIV ANTIBODY (ROUTINE TESTING W REFLEX)  CBC  BASIC METABOLIC PANEL    EKG None  Radiology CT ABDOMEN PELVIS W CONTRAST  Result Date: 08/20/2023 CLINICAL DATA:  Right lower quadrant pain EXAM: CT ABDOMEN AND PELVIS WITH CONTRAST TECHNIQUE: Multidetector CT imaging of the abdomen and pelvis was performed using the standard protocol following bolus administration of intravenous contrast. RADIATION DOSE REDUCTION: This exam was performed according to the departmental dose-optimization program which includes automated exposure control, adjustment of the mA and/or kV according to patient size and/or use of iterative reconstruction technique. CONTRAST:  OMNIPAQUE IOHEXOL 300 MG/ML  SOLN COMPARISON:  CT 04/06/2020 FINDINGS: Lower chest: Lung bases demonstrate no acute airspace disease. Hepatobiliary: Hepatic steatosis. No calcified gallstone or biliary dilatation Pancreas: Unremarkable. No pancreatic ductal dilatation or surrounding inflammatory changes. Spleen: Normal in size without focal abnormality. Adrenals/Urinary Tract: Adrenal glands are unremarkable.  Kidneys are normal, without renal calculi, focal lesion, or hydronephrosis. Bladder is unremarkable. Stomach/Bowel: The stomach is nonenlarged. No dilated small bowel. Appendix appears slightly enlarged, measuring up to 11 mm, mild periappendiceal stranding. No perforation or abscess. Rounded filling defects within the appendix lumen could represent small appendicoliths. Vascular/Lymphatic: No significant vascular findings are present. No enlarged abdominal or pelvic lymph nodes. Reproductive: Uterus and bilateral adnexa are unremarkable. Other: No abdominal wall hernia or abnormality. No abdominopelvic ascites. Musculoskeletal: No acute or significant osseous findings. IMPRESSION: 1. Findings suspicious for early or mild acute appendicitis. No perforation or abscess. 2. Hepatic steatosis. Electronically Signed   By: Jasmine Pang M.D.   On: 08/20/2023 22:16    Procedures .Critical Care  Performed by: Linwood Dibbles, MD Authorized by:  Linwood Dibbles, MD   Critical care provider statement:    Critical care time (minutes):  30   Critical care was time spent personally by me on the following activities:  Development of treatment plan with patient or surrogate, discussions with consultants, evaluation of patient's response to treatment, examination of patient, ordering and review of laboratory studies, ordering and review of radiographic studies, ordering and performing treatments and interventions, pulse oximetry, re-evaluation of patient's condition and review of old charts     Medications Ordered in ED Medications  EPINEPHrine (EPI-PEN) injection 0.3 mg (0.3 mg Intramuscular Patient Refused/Not Given 08/20/23 2201)  dextrose 5 % and 0.45 % NaCl with KCl 20 mEq/L infusion (has no administration in time range)  acetaminophen (TYLENOL) tablet 650 mg (has no administration in time range)    Or  acetaminophen (TYLENOL) suppository 650 mg (has no administration in time range)  oxyCODONE (Oxy IR/ROXICODONE)  immediate release tablet 5 mg (has no administration in time range)  morphine (PF) 2 MG/ML injection 2 mg (has no administration in time range)  polyethylene glycol (MIRALAX / GLYCOLAX) packet 17 g (has no administration in time range)  ondansetron (ZOFRAN-ODT) disintegrating tablet 4 mg (has no administration in time range)    Or  ondansetron (ZOFRAN) injection 4 mg (has no administration in time range)  metoprolol tartrate (LOPRESSOR) injection 5 mg (has no administration in time range)  enoxaparin (LOVENOX) injection 40 mg (40 mg Subcutaneous Given 08/20/23 2340)  cefTRIAXone (ROCEPHIN) 2 g in sodium chloride 0.9 % 100 mL IVPB (2 g Intravenous New Bag/Given 08/20/23 2340)    And  metroNIDAZOLE (FLAGYL) IVPB 500 mg (has no administration in time range)  diphenhydrAMINE (BENADRYL) capsule 25 mg (has no administration in time range)    Or  diphenhydrAMINE (BENADRYL) injection 25 mg (has no administration in time range)  iohexol (OMNIPAQUE) 300 MG/ML solution 100 mL (100 mLs Intravenous Contrast Given 08/20/23 2121)  diphenhydrAMINE (BENADRYL) injection 25 mg (25 mg Intravenous Given 08/20/23 2147)  dexamethasone (DECADRON) injection 10 mg (10 mg Intravenous Given 08/20/23 2147)  famotidine (PEPCID) IVPB 20 mg premix (0 mg Intravenous Stopped 08/20/23 2248)  albuterol (PROVENTIL) (2.5 MG/3ML) 0.083% nebulizer solution 2.5 mg (2.5 mg Nebulization Given 08/20/23 2201)    ED Course/ Medical Decision Making/ A&P Clinical Course as of 08/20/23 2352  Mon Aug 20, 2023  2151 Patient has urticaria.  She is also frequently coughing.  Will treat with steroids antihistamines albuterol and epinephrine. [JK]  2157 EpiPen ordered.  Nurse went to administer it patient states she is feeling better declines epinephrine injection at this time [JK]  2223 CT scan shows findings suspicious for appendicitis [JK]  2227 Case discussed with Dr. Sheliah Hatch.  Will plan bringing patient in for observation [JK]    Clinical  Course User Index [JK] Linwood Dibbles, MD                                 Medical Decision Making Risk Prescription drug management. Decision regarding hospitalization.   Patient presented to the ED for evaluation of abdominal pain.  She had an outpatient CT scan concerning for possible appendicitis.  Patient's presentation atypical.  She does not have any tenderness palpation in the right lower quadrant at this time.  CT scan however does show findings concerning for possible appendicitis.  Consulted with Dr. Sheliah Hatch.  Plan on admission to the hospital for observation.  While in the ED  however patient did have an acute allergic reaction to the contrast dye.  She was given steroids and antihistamines.  I initially ordered an EpiPen but patient declined.  Her symptoms did improve.        Final Clinical Impression(s) / ED Diagnoses Final diagnoses:  Abdominal pain, unspecified abdominal location  Allergic reaction to contrast material, initial encounter    Rx / DC Orders ED Discharge Orders     None         Linwood Dibbles, MD 08/20/23 2352

## 2023-08-20 NOTE — ED Notes (Signed)
Pt came to triage from waiting area, says she just started coughing all of a sudden and having itching that started on the left arm and all over. She has just completed ct with contrast

## 2023-08-20 NOTE — ED Triage Notes (Signed)
Pt had noncontrast CT and they were concerned for appendicitis. Pt PCP sent pt here for contrast CT. Pt has right sided tenderness. Denies N/V. Denies fever

## 2023-08-20 NOTE — ED Notes (Signed)
Pt states throat is feeling better, she is having no difficulty breathing or wheezing. Dr. Lynelle Doctor notified

## 2023-08-21 ENCOUNTER — Observation Stay (HOSPITAL_BASED_OUTPATIENT_CLINIC_OR_DEPARTMENT_OTHER): Payer: Commercial Managed Care - HMO | Admitting: Anesthesiology

## 2023-08-21 ENCOUNTER — Encounter (HOSPITAL_COMMUNITY)
Admission: EM | Disposition: A | Discharge status: Home / Self Care | Payer: Self-pay | Source: Home / Self Care | Attending: Emergency Medicine

## 2023-08-21 ENCOUNTER — Other Ambulatory Visit: Payer: Self-pay

## 2023-08-21 ENCOUNTER — Encounter (HOSPITAL_COMMUNITY): Payer: Self-pay

## 2023-08-21 ENCOUNTER — Observation Stay (HOSPITAL_COMMUNITY): Payer: Commercial Managed Care - HMO | Admitting: Anesthesiology

## 2023-08-21 DIAGNOSIS — K3580 Unspecified acute appendicitis: Secondary | ICD-10-CM | POA: Diagnosis not present

## 2023-08-21 HISTORY — PX: LAPAROSCOPIC APPENDECTOMY: SHX408

## 2023-08-21 LAB — BASIC METABOLIC PANEL
Anion gap: 11 (ref 5–15)
BUN: 15 mg/dL (ref 6–20)
CO2: 19 mmol/L — ABNORMAL LOW (ref 22–32)
Calcium: 8.9 mg/dL (ref 8.9–10.3)
Chloride: 107 mmol/L (ref 98–111)
Creatinine, Ser: 0.66 mg/dL (ref 0.44–1.00)
GFR, Estimated: >60 mL/min (ref >60)
Glucose, Bld: 169 mg/dL — ABNORMAL HIGH (ref 70–99)
Potassium: 3.9 mmol/L (ref 3.5–5.1)
Sodium: 137 mmol/L (ref 135–145)

## 2023-08-21 LAB — CBC
HCT: 39 % (ref 36.0–46.0)
Hemoglobin: 13.2 g/dL (ref 12.0–15.0)
MCH: 30.1 pg (ref 26.0–34.0)
MCHC: 33.8 g/dL (ref 30.0–36.0)
MCV: 88.8 fL (ref 80.0–100.0)
Platelets: 134 10*3/uL — ABNORMAL LOW (ref 150–400)
RBC: 4.39 MIL/uL (ref 3.87–5.11)
RDW: 13 % (ref 11.5–15.5)
WBC: 5.7 10*3/uL (ref 4.0–10.5)
nRBC: 0 % (ref 0.0–0.2)

## 2023-08-21 LAB — HIV ANTIBODY (ROUTINE TESTING W REFLEX): HIV Screen 4th Generation wRfx: NONREACTIVE

## 2023-08-21 SURGERY — APPENDECTOMY, LAPAROSCOPIC
Anesthesia: General

## 2023-08-21 MED ORDER — FENTANYL CITRATE (PF) 250 MCG/5ML IJ SOLN
INTRAMUSCULAR | Status: AC
  Filled 2023-08-21: qty 5

## 2023-08-21 MED ORDER — LIDOCAINE HCL 1 % IJ SOLN
INTRAMUSCULAR | Status: AC
  Filled 2023-08-21: qty 20

## 2023-08-21 MED ORDER — BUPIVACAINE HCL (PF) 0.25 % IJ SOLN
INTRAMUSCULAR | Status: AC
  Filled 2023-08-21: qty 30

## 2023-08-21 MED ORDER — FENTANYL CITRATE PF 50 MCG/ML IJ SOSY
25.0000 ug | PREFILLED_SYRINGE | INTRAMUSCULAR | Status: DC | PRN
  Administered 2023-08-21 (×2): 50 ug via INTRAVENOUS

## 2023-08-21 MED ORDER — PROPOFOL 10 MG/ML IV BOLUS
INTRAVENOUS | Status: DC | PRN
  Administered 2023-08-21: 150 mg via INTRAVENOUS
  Administered 2023-08-21: 50 mg via INTRAVENOUS

## 2023-08-21 MED ORDER — FENTANYL CITRATE (PF) 100 MCG/2ML IJ SOLN
INTRAMUSCULAR | Status: DC | PRN
  Administered 2023-08-21 (×3): 50 ug via INTRAVENOUS
  Administered 2023-08-21: 100 ug via INTRAVENOUS

## 2023-08-21 MED ORDER — LACTATED RINGERS IR SOLN
Status: DC | PRN
Start: 2023-08-21 — End: 2023-08-21
  Administered 2023-08-21: 3000 mL

## 2023-08-21 MED ORDER — LACTATED RINGERS IV SOLN
INTRAVENOUS | Status: DC | PRN
Start: 2023-08-21 — End: 2023-08-21

## 2023-08-21 MED ORDER — DEXAMETHASONE SODIUM PHOSPHATE 10 MG/ML IJ SOLN
INTRAMUSCULAR | Status: AC
  Filled 2023-08-21: qty 1

## 2023-08-21 MED ORDER — ONDANSETRON HCL 4 MG/2ML IJ SOLN
INTRAMUSCULAR | Status: AC
  Filled 2023-08-21: qty 2

## 2023-08-21 MED ORDER — OXYCODONE HCL 5 MG PO TABS
5.0000 mg | ORAL_TABLET | Freq: Once | ORAL | Status: AC | PRN
  Administered 2023-08-21: 5 mg via ORAL

## 2023-08-21 MED ORDER — DEXAMETHASONE SODIUM PHOSPHATE 4 MG/ML IJ SOLN
INTRAMUSCULAR | Status: DC | PRN
Start: 2023-08-21 — End: 2023-08-21
  Administered 2023-08-21: 5 mg via INTRAVENOUS

## 2023-08-21 MED ORDER — MIDAZOLAM HCL 2 MG/2ML IJ SOLN
INTRAMUSCULAR | Status: AC
  Filled 2023-08-21: qty 2

## 2023-08-21 MED ORDER — OXYCODONE HCL 5 MG/5ML PO SOLN: 5.0000 mg | Freq: Once | ORAL | Status: AC | PRN

## 2023-08-21 MED ORDER — FENTANYL CITRATE PF 50 MCG/ML IJ SOSY
PREFILLED_SYRINGE | INTRAMUSCULAR | Status: AC
  Administered 2023-08-21: 50 ug via INTRAVENOUS
  Filled 2023-08-21: qty 2

## 2023-08-21 MED ORDER — MIDAZOLAM HCL 5 MG/5ML IJ SOLN
INTRAMUSCULAR | Status: DC | PRN
  Administered 2023-08-21: 2 mg via INTRAVENOUS

## 2023-08-21 MED ORDER — ROCURONIUM BROMIDE 10 MG/ML (PF) SYRINGE
PREFILLED_SYRINGE | INTRAVENOUS | Status: AC
  Filled 2023-08-21: qty 10

## 2023-08-21 MED ORDER — DIPHENHYDRAMINE HCL 50 MG/ML IJ SOLN
INTRAMUSCULAR | Status: AC
  Filled 2023-08-21: qty 1

## 2023-08-21 MED ORDER — CHLORHEXIDINE GLUCONATE 0.12 % MT SOLN
15.0000 mL | Freq: Once | OROMUCOSAL | Status: AC
  Administered 2023-08-21: 15 mL via OROMUCOSAL

## 2023-08-21 MED ORDER — OXYCODONE HCL 5 MG PO TABS
ORAL_TABLET | ORAL | Status: AC
  Filled 2023-08-21: qty 1

## 2023-08-21 MED ORDER — ONDANSETRON HCL 4 MG/2ML IJ SOLN
INTRAMUSCULAR | Status: DC | PRN
  Administered 2023-08-21: 4 mg via INTRAVENOUS

## 2023-08-21 MED ORDER — 0.9 % SODIUM CHLORIDE (POUR BTL) OPTIME
TOPICAL | Status: DC | PRN
Start: 2023-08-21 — End: 2023-08-21
  Administered 2023-08-21: 1000 mL

## 2023-08-21 MED ORDER — AMISULPRIDE (ANTIEMETIC) 5 MG/2ML IV SOLN: 10.0000 mg | Freq: Once | INTRAVENOUS | Status: DC | PRN

## 2023-08-21 MED ORDER — PROPOFOL 10 MG/ML IV BOLUS
INTRAVENOUS | Status: AC
  Filled 2023-08-21: qty 20

## 2023-08-21 MED ORDER — SODIUM CHLORIDE 0.9 % IV SOLN: 12.5000 mg | INTRAVENOUS | Status: DC | PRN

## 2023-08-21 MED ORDER — TRAMADOL HCL 50 MG PO TABS
50.0000 mg | ORAL_TABLET | Freq: Four times a day (QID) | ORAL | 0 refills | Status: DC | PRN
Start: 2023-08-21 — End: 2023-11-16

## 2023-08-21 MED ORDER — SUGAMMADEX SODIUM 200 MG/2ML IV SOLN
INTRAVENOUS | Status: DC | PRN
Start: 2023-08-21 — End: 2023-08-21
  Administered 2023-08-21: 200 mg via INTRAVENOUS

## 2023-08-21 MED ORDER — BUPIVACAINE HCL (PF) 0.25 % IJ SOLN
INTRAMUSCULAR | Status: DC | PRN
Start: 2023-08-21 — End: 2023-08-21
  Administered 2023-08-21: 20 mL

## 2023-08-21 MED ORDER — FENTANYL CITRATE PF 50 MCG/ML IJ SOSY
PREFILLED_SYRINGE | INTRAMUSCULAR | Status: AC
  Filled 2023-08-21: qty 1

## 2023-08-21 MED ORDER — ROCURONIUM BROMIDE 100 MG/10ML IV SOLN
INTRAVENOUS | Status: DC | PRN
  Administered 2023-08-21: 50 mg via INTRAVENOUS

## 2023-08-21 MED ORDER — DIPHENHYDRAMINE HCL 50 MG/ML IJ SOLN
12.5000 mg | Freq: Once | INTRAMUSCULAR | Status: AC
  Administered 2023-08-21: 12.5 mg via INTRAVENOUS

## 2023-08-21 MED ORDER — LIDOCAINE HCL (CARDIAC) PF 100 MG/5ML IV SOSY
PREFILLED_SYRINGE | INTRAVENOUS | Status: DC | PRN
  Administered 2023-08-21: 60 mg via INTRAVENOUS

## 2023-08-21 MED ORDER — LACTATED RINGERS IV SOLN: Freq: Once | INTRAVENOUS | Status: AC

## 2023-08-21 SURGICAL SUPPLY — 33 items
ADH SKN CLS APL DERMABOND .7 (GAUZE/BANDAGES/DRESSINGS) ×1
APL PRP STRL LF DISP 70% ISPRP (MISCELLANEOUS) ×1
BAG COUNTER SPONGE SURGICOUNT (BAG) IMPLANT
BAG SPNG CNTER NS LX DISP (BAG)
CHLORAPREP W/TINT 26 (MISCELLANEOUS) ×1 IMPLANT
COVER SURGICAL LIGHT HANDLE (MISCELLANEOUS) ×1 IMPLANT
CUTTER FLEX LINEAR 45M (STAPLE) IMPLANT
DERMABOND ADVANCED .7 DNX12 (GAUZE/BANDAGES/DRESSINGS) ×1 IMPLANT
ELECT PENCIL ROCKER SW 15FT (MISCELLANEOUS) IMPLANT
ELECT REM PT RETURN 15FT ADLT (MISCELLANEOUS) ×1 IMPLANT
GLOVE SURG ORTHO 8.0 STRL STRW (GLOVE) ×1 IMPLANT
GOWN STRL REUS W/ TWL XL LVL3 (GOWN DISPOSABLE) ×2 IMPLANT
GOWN STRL REUS W/TWL XL LVL3 (GOWN DISPOSABLE) ×2
IRRIG SUCT STRYKERFLOW 2 WTIP (MISCELLANEOUS) ×1
IRRIGATION SUCT STRKRFLW 2 WTP (MISCELLANEOUS) ×1 IMPLANT
KIT BASIN OR (CUSTOM PROCEDURE TRAY) ×1 IMPLANT
KIT TURNOVER KIT A (KITS) IMPLANT
RELOAD 45 VASCULAR/THIN (ENDOMECHANICALS) IMPLANT
RELOAD STAPLE 45 2.5 WHT GRN (ENDOMECHANICALS) IMPLANT
RELOAD STAPLE 45 3.5 BLU ETS (ENDOMECHANICALS) IMPLANT
RELOAD STAPLE TA45 3.5 REG BLU (ENDOMECHANICALS) ×1 IMPLANT
SET TUBE SMOKE EVAC HIGH FLOW (TUBING) ×1 IMPLANT
SHEARS HARMONIC 36 ACE (MISCELLANEOUS) ×1 IMPLANT
SPIKE FLUID TRANSFER (MISCELLANEOUS) ×1 IMPLANT
SUT MNCRL AB 4-0 PS2 18 (SUTURE) ×1 IMPLANT
SYS BAG RETRIEVAL 10MM (BASKET) ×1
SYSTEM BAG RETRIEVAL 10MM (BASKET) ×1 IMPLANT
TOWEL OR 17X26 10 PK STRL BLUE (TOWEL DISPOSABLE) ×1 IMPLANT
TOWEL OR NON WOVEN STRL DISP B (DISPOSABLE) ×1 IMPLANT
TRAY LAPAROSCOPIC (CUSTOM PROCEDURE TRAY) ×1 IMPLANT
TROCAR 11X100 Z THREAD (TROCAR) ×1 IMPLANT
TROCAR BALLN 12MMX100 BLUNT (TROCAR) ×1 IMPLANT
TROCAR Z-THREAD OPTICAL 5X100M (TROCAR) ×1 IMPLANT

## 2023-08-21 NOTE — Anesthesia Preprocedure Evaluation (Addendum)
Anesthesia Evaluation  Patient identified by MRN, date of birth, ID band Patient awake    Reviewed: Allergy & Precautions, NPO status , Patient's Chart, lab work & pertinent test results  History of Anesthesia Complications Negative for: history of anesthetic complications  Airway Mallampati: II  TM Distance: >3 FB Neck ROM: Full    Dental  (+) Dental Advisory Given   Pulmonary former smoker   Pulmonary exam normal        Cardiovascular negative cardio ROS Normal cardiovascular exam     Neuro/Psych  PSYCHIATRIC DISORDERS Anxiety     negative neurological ROS     GI/Hepatic Neg liver ROS,GERD  Medicated and Controlled,, Appendicitis    Endo/Other  negative endocrine ROS    Renal/GU negative Renal ROS     Musculoskeletal negative musculoskeletal ROS (+)    Abdominal   Peds  Hematology  Plt 134k    Anesthesia Other Findings   Reproductive/Obstetrics                             Anesthesia Physical Anesthesia Plan  ASA: 2  Anesthesia Plan: General   Post-op Pain Management: Ofirmev IV (intra-op)* and Toradol IV (intra-op)*   Induction: Intravenous  PONV Risk Score and Plan: 3 and Treatment may vary due to age or medical condition, Ondansetron, Dexamethasone and Midazolam  Airway Management Planned: Oral ETT  Additional Equipment: None  Intra-op Plan:   Post-operative Plan: Extubation in OR  Informed Consent: I have reviewed the patients History and Physical, chart, labs and discussed the procedure including the risks, benefits and alternatives for the proposed anesthesia with the patient or authorized representative who has indicated his/her understanding and acceptance.     Dental advisory given  Plan Discussed with: CRNA and Anesthesiologist  Anesthesia Plan Comments:        Anesthesia Quick Evaluation

## 2023-08-21 NOTE — Progress Notes (Signed)
Patient was given discharge instructions, and all questions were answered.  Patient was stable for discharge and was taken to the main exit by wheelchair. 

## 2023-08-21 NOTE — ED Notes (Signed)
ED TO INPATIENT HANDOFF REPORT  ED Nurse Name and Phone #: Robbi Garter Name/Age/Gender Belinda Mcintosh 42 y.o. female Room/Bed: WA14/WA14  Code Status   Code Status: Full Code  Home/SNF/Other Home Patient oriented to: self, place, time, and situation Is this baseline? Yes   Triage Complete: Triage complete  Chief Complaint Acute appendicitis [K35.80]  Triage Note Pt had noncontrast CT and they were concerned for appendicitis. Pt PCP sent pt here for contrast CT. Pt has right sided tenderness. Denies N/V. Denies fever   Allergies Allergies  Allergen Reactions   Ivp Dye [Iodinated Contrast Media] Anaphylaxis, Hives and Cough   Shrimp [Shellfish Allergy] Itching   Rocephin [Ceftriaxone] Hives    Level of Care/Admitting Diagnosis ED Disposition     ED Disposition  Admit   Condition  --   Comment  Hospital Area: [facility] [[id number]]  Level of Care: Med-Surg [16]  May place patient in observation at [facility] or [facility] if equivalent level of care is available:: No  Covid Evaluation: Asymptomatic - no recent exposure (last 10 days) testing not required  Diagnosis: Acute appendicitis [161096]  Admitting Physician: CCS, MD [[id number]]  Attending Physician: CCS, MD [[id number]]          B Medical/Surgery History Past Medical History:  Diagnosis Date   Abdominal pain    Flank pain    History of kidney stones    Low back pain    Palpitations    Umbilical hernia 2018   Past Surgical History:  Procedure Laterality Date   COSMETIC SURGERY     INDUCED ABORTION     LIPOSUCTION     NOSE SURGERY       A IV Location/Drains/Wounds Patient Lines/Drains/Airways Status     Active Line/Drains/Airways     Name Placement date Placement time Site Days   Peripheral IV 08/20/23 20 G Left Antecubital 08/20/23  2059  Antecubital  1            Intake/Output Last 24 hours  Intake/Output Summary (Last 24 hours) at 08/21/2023 0749 Last data filed at  08/20/2023 2248 Gross per 24 hour  Intake 50 ml  Output --  Net 50 ml    Labs/Imaging Results for orders placed or performed during the hospital encounter of 08/20/23 (from the past 48 hour(s))  CBC with Differential     Status: Abnormal   Collection Time: 08/20/23  8:57 PM  Result Value Ref Range   WBC 7.9 4.0 - 10.5 K/uL   RBC 4.43 3.87 - 5.11 MIL/uL   Hemoglobin 13.2 12.0 - 15.0 g/dL   HCT 04.5 40.9 - 81.1 %   MCV 90.1 80.0 - 100.0 fL   MCH 29.8 26.0 - 34.0 pg   MCHC 33.1 30.0 - 36.0 g/dL   RDW 91.4 78.2 - 95.6 %   Platelets 139 (L) 150 - 400 K/uL   nRBC 0.0 0.0 - 0.2 %   Neutrophils Relative % 61 %   Neutro Abs 4.8 1.7 - 7.7 K/uL   Lymphocytes Relative 33 %   Lymphs Abs 2.6 0.7 - 4.0 K/uL   Monocytes Relative 5 %   Monocytes Absolute 0.4 0.1 - 1.0 K/uL   Eosinophils Relative 1 %   Eosinophils Absolute 0.1 0.0 - 0.5 K/uL   Basophils Relative 0 %   Basophils Absolute 0.0 0.0 - 0.1 K/uL   Immature Granulocytes 0 %   Abs Immature Granulocytes 0.01 0.00 - 0.07 K/uL  Comment: Performed at [facility], [address] [place], [place] [address]  Comprehensive metabolic panel     Status: Abnormal   Collection Time: 08/20/23  8:57 PM  Result Value Ref Range   Sodium 135 135 - 145 mmol/L   Potassium 3.5 3.5 - 5.1 mmol/L   Chloride 106 98 - 111 mmol/L   CO2 22 22 - 32 mmol/L   Glucose, Bld 93 70 - 99 mg/dL    Comment: Glucose reference range applies only to samples taken after fasting for at least 8 hours.   BUN 17 6 - 20 mg/dL   Creatinine, Ser 0.98 0.44 - 1.00 mg/dL   Calcium 8.6 (L) 8.9 - 10.3 mg/dL   Total Protein 7.7 6.5 - 8.1 g/dL   Albumin 4.1 3.5 - 5.0 g/dL   AST 15 15 - 41 U/L   ALT 24 0 - 44 U/L   Alkaline Phosphatase 50 38 - 126 U/L   Total Bilirubin 0.5 0.3 - 1.2 mg/dL   GFR, Estimated >11 >91 mL/min    Comment: (NOTE) Calculated using the CKD-EPI Creatinine Equation (2021)    Anion gap 7 5 - 15    Comment: Performed at [facility], [address] [place], [place] [address]  Lipase, blood     Status: None   Collection Time: 08/20/23  8:57 PM  Result Value Ref Range   Lipase 28 11 - 51 U/L    Comment: Performed at [facility], [address], [place], [place] [address]  hCG, serum, qualitative     Status: None   Collection Time: 08/20/23  8:57 PM  Result Value Ref Range   Preg, Serum NEGATIVE NEGATIVE    Comment:        THE SENSITIVITY OF THIS METHODOLOGY IS >10 mIU/mL. Performed at [facility], [address], [place], [place] [address]   I-stat chem 8, ED (not at [facility], [facility] or [facility])     Status: Abnormal   Collection Time: 08/20/23  9:06 PM  Result Value Ref Range   Sodium 139 135 - 145 mmol/L   Potassium 3.6 3.5 - 5.1 mmol/L   Chloride 105 98 - 111 mmol/L   BUN 16 6 - 20 mg/dL   Creatinine, Ser 8.46 0.44 - 1.00 mg/dL   Glucose, Bld 91 70 - 99 mg/dL    Comment: Glucose reference range applies only to samples taken after fasting for at least 8 hours.   Calcium, Ion 1.20 1.15 - 1.40 mmol/L   TCO2 21 (L) 22 - 32 mmol/L   Hemoglobin 13.6 12.0 - 15.0 g/dL   HCT 96.2 95.2 - 84.1 %  Urinalysis, Routine w reflex microscopic -Urine, Clean Catch     Status: Abnormal   Collection Time: 08/20/23 10:58 PM  Result Value Ref Range   Color, Urine COLORLESS (A) YELLOW   APPearance CLEAR CLEAR   Specific Gravity, Urine 1.044 (H) 1.005 - 1.030   pH 6.0 5.0 - 8.0   Glucose, UA NEGATIVE NEGATIVE mg/dL   Hgb urine dipstick NEGATIVE NEGATIVE   Bilirubin Urine NEGATIVE NEGATIVE   Ketones, ur NEGATIVE NEGATIVE mg/dL   Protein, ur NEGATIVE NEGATIVE mg/dL   Nitrite NEGATIVE NEGATIVE   Leukocytes,Ua MODERATE (A) NEGATIVE   RBC / HPF 0-5 0 - 5 RBC/hpf   WBC, UA 11-20 0 - 5 WBC/hpf   Bacteria, UA NONE SEEN NONE SEEN   Squamous Epithelial / HPF 6-10 0 - 5 /  HPF    Comment: Performed at [facility], [address] [place], [place] [address]  CBC      Status: Abnormal   Collection Time: 08/21/23  5:27 AM  Result Value Ref Range   WBC 5.7 4.0 - 10.5 K/uL   RBC 4.39 3.87 - 5.11 MIL/uL   Hemoglobin 13.2 12.0 - 15.0 g/dL   HCT 60.4 54.0 - 98.1 %   MCV 88.8 80.0 - 100.0 fL   MCH 30.1 26.0 - 34.0 pg   MCHC 33.8 30.0 - 36.0 g/dL   RDW 19.1 47.8 - 29.5 %   Platelets 134 (L) 150 - 400 K/uL   nRBC 0.0 0.0 - 0.2 %    Comment: Performed at [facility], [address], [place], [place] [address]  Basic metabolic panel     Status: Abnormal   Collection Time: 08/21/23  5:27 AM  Result Value Ref Range   Sodium 137 135 - 145 mmol/L   Potassium 3.9 3.5 - 5.1 mmol/L   Chloride 107 98 - 111 mmol/L   CO2 19 (L) 22 - 32 mmol/L   Glucose, Bld 169 (H) 70 - 99 mg/dL    Comment: Glucose reference range applies only to samples taken after fasting for at least 8 hours.   BUN 15 6 - 20 mg/dL   Creatinine, Ser 8.65 0.44 - 1.00 mg/dL   Calcium 8.9 8.9 - 78.4 mg/dL   GFR, Estimated >69 >62 mL/min    Comment: (NOTE) Calculated using the CKD-EPI Creatinine Equation (2021)    Anion gap 11 5 - 15    Comment: Performed at [facility], [address] [place], [place] [address]   CT ABDOMEN PELVIS W CONTRAST  Result Date: 08/20/2023 CLINICAL DATA:  Right lower quadrant pain EXAM: CT ABDOMEN AND PELVIS WITH CONTRAST TECHNIQUE: Multidetector CT imaging of the abdomen and pelvis was performed using the standard protocol following bolus administration of intravenous contrast. RADIATION DOSE REDUCTION: This exam was performed according to the departmental dose-optimization program which includes automated exposure control, adjustment of the mA and/or kV according to patient size and/or use of iterative reconstruction technique. CONTRAST:  OMNIPAQUE IOHEXOL 300 MG/ML  SOLN COMPARISON:  CT 04/06/2020 FINDINGS: Lower chest: Lung bases demonstrate no acute airspace disease. Hepatobiliary: Hepatic steatosis. No calcified gallstone  or biliary dilatation Pancreas: Unremarkable. No pancreatic ductal dilatation or surrounding inflammatory changes. Spleen: Normal in size without focal abnormality. Adrenals/Urinary Tract: Adrenal glands are unremarkable. Kidneys are normal, without renal calculi, focal lesion, or hydronephrosis. Bladder is unremarkable. Stomach/Bowel: The stomach is nonenlarged. No dilated small bowel. Appendix appears slightly enlarged, measuring up to 11 mm, mild periappendiceal stranding. No perforation or abscess. Rounded filling defects within the appendix lumen could represent small appendicoliths. Vascular/Lymphatic: No significant vascular findings are present. No enlarged abdominal or pelvic lymph nodes. Reproductive: Uterus and bilateral adnexa are unremarkable. Other: No abdominal wall hernia or abnormality. No abdominopelvic ascites. Musculoskeletal: No acute or significant osseous findings. IMPRESSION: 1. Findings suspicious for early or mild acute appendicitis. No perforation or abscess. 2. Hepatic steatosis. Electronically Signed   By: Jasmine Pang M.D.   On: 08/20/2023 22:16    Pending Labs Unresulted Labs (From admission, onward)     Start     Ordered   08/27/23 0500  Creatinine, serum  (enoxaparin (LOVENOX)    CrCl >/= 30 ml/min)  Weekly,   R     Comments: while on enoxaparin therapy  08/20/23 2321   08/20/23 2320  HIV Antibody (routine testing w rflx)  (HIV Antibody (Routine testing w reflex) panel)  Once,   R        08/20/23 2321            Vitals/Pain Today's Vitals   08/21/23 0600 08/21/23 0613 08/21/23 0700 08/21/23 0718  BP: 126/65  (!) 108/54 107/60  Pulse: 79  83 81  Resp: 18  17 19   Temp:  98.4 F (36.9 C)  98.1 F (36.7 C)  TempSrc:    Oral  SpO2: 96%  97% 95%  Weight:      Height:      PainSc:        Isolation Precautions No active isolations  Medications Medications  EPINEPHrine (EPI-PEN) injection 0.3 mg (0.3 mg Intramuscular Patient Refused/Not Given 08/20/23  2201)  dextrose 5 % and 0.45 % NaCl with KCl 20 mEq/L infusion ( Intravenous New Bag/Given 08/21/23 0046)  acetaminophen (TYLENOL) tablet 650 mg (has no administration in time range)    Or  acetaminophen (TYLENOL) suppository 650 mg (has no administration in time range)  oxyCODONE (Oxy IR/ROXICODONE) immediate release tablet 5 mg (has no administration in time range)  morphine (PF) 2 MG/ML injection 2 mg (has no administration in time range)  polyethylene glycol (MIRALAX / GLYCOLAX) packet 17 g (has no administration in time range)  ondansetron (ZOFRAN-ODT) disintegrating tablet 4 mg (has no administration in time range)    Or  ondansetron (ZOFRAN) injection 4 mg (has no administration in time range)  metoprolol tartrate (LOPRESSOR) injection 5 mg (has no administration in time range)  enoxaparin (LOVENOX) injection 40 mg (40 mg Subcutaneous Given 08/20/23 2340)  metroNIDAZOLE (FLAGYL) IVPB 500 mg (0 mg Intravenous Stopped 08/21/23 0105)  diphenhydrAMINE (BENADRYL) capsule 25 mg ( Oral See Alternative 08/21/23 0000)    Or  diphenhydrAMINE (BENADRYL) injection 25 mg (25 mg Intravenous Given 08/21/23 0000)  ciprofloxacin (CIPRO) IVPB 400 mg (0 mg Intravenous Stopped 08/21/23 0146)  iohexol (OMNIPAQUE) 300 MG/ML solution 100 mL (100 mLs Intravenous Contrast Given 08/20/23 2121)  diphenhydrAMINE (BENADRYL) injection 25 mg (25 mg Intravenous Given 08/20/23 2147)  dexamethasone (DECADRON) injection 10 mg (10 mg Intravenous Given 08/20/23 2147)  famotidine (PEPCID) IVPB 20 mg premix (0 mg Intravenous Stopped 08/20/23 2248)  albuterol (PROVENTIL) (2.5 MG/3ML) 0.083% nebulizer solution 2.5 mg (2.5 mg Nebulization Given 08/20/23 2201)    Mobility walks     Focused Assessments   R Recommendations: See Admitting Provider Note  Report given to:   Additional Notes:

## 2023-08-21 NOTE — Anesthesia Postprocedure Evaluation (Signed)
Anesthesia Post Note  Patient: Belinda Mcintosh  Procedure(s) Performed: APPENDECTOMY LAPAROSCOPIC     Patient location during evaluation: PACU Anesthesia Type: General Level of consciousness: awake and alert Pain management: pain level controlled Vital Signs Assessment: post-procedure vital signs reviewed and stable Respiratory status: spontaneous breathing, nonlabored ventilation and respiratory function stable Cardiovascular status: stable and blood pressure returned to baseline Anesthetic complications: no   No notable events documented.  Last Vitals:  Vitals:   08/21/23 1515 08/21/23 1534  BP: 114/77 120/66  Pulse: 95 73  Resp: 16 15  Temp: (!) 36.4 C (!) 36.4 C  SpO2: 97% 97%    Last Pain:  Vitals:   08/21/23 1534  TempSrc: Oral  PainSc: 4                  Beryle Lathe

## 2023-08-21 NOTE — Anesthesia Procedure Notes (Signed)
Procedure Name: Intubation Date/Time: 08/21/2023 1:23 PM  Performed by: Ahmed Prima, CRNAPre-anesthesia Checklist: Patient identified, Emergency Drugs available, Suction available and Patient being monitored Patient Re-evaluated:Patient Re-evaluated prior to induction Oxygen Delivery Method: Circle system utilized Preoxygenation: Pre-oxygenation with 100% oxygen Induction Type: IV induction Ventilation: Mask ventilation without difficulty Laryngoscope Size: Miller and 2 Grade View: Grade I Tube type: Oral Tube size: 7.0 mm Number of attempts: 1 Airway Equipment and Method: Stylet and Oral airway Placement Confirmation: ETT inserted through vocal cords under direct vision, positive ETCO2 and breath sounds checked- equal and bilateral Secured at: 21 (at the lip) cm Tube secured with: Tape Dental Injury: Teeth and Oropharynx as per pre-operative assessment

## 2023-08-21 NOTE — Discharge Instructions (Signed)
[facility]  LAPAROSCOPIC SURGERY:  POST-OP INSTRUCTIONS  Always review your discharge instruction sheet given to you by the facility where your surgery was performed.  A prescription for pain medication may be given to you upon discharge.  Take your pain medication as prescribed.  If narcotic pain medicine is not needed, then you may take acetaminophen (Tylenol) or ibuprofen (Advil) as needed.  Take your usually prescribed medications unless otherwise directed.  If you need a refill on your pain medication, please contact your pharmacy.  They will contact our office to request authorization. Prescriptions will not be filled after 5 P.M. or on weekends.  You should follow a light diet the first few days after arrival home, such as soup and crackers or toast.  Be sure to include plenty of fluids daily.  Most patients will experience some swelling and bruising in the area of the incisions.  Ice packs will help.  Swelling and bruising can take several days to resolve.   It is common to experience some constipation after surgery.  Increasing fluid intake and taking a stool softener (such as Colace) will usually help or prevent this problem from occurring.  A mild laxative (Milk of Magnesia or Miralax) should be taken according to package instructions if there has been no bowel movement after 48 hours.  You will likely have Dermabond (topical glue) over your incisions.  This seals the incisions and allows you to bathe and shower at any time after your surgery.  Glue should remain in place for up to 10 days.  It may be removed after 10 days by pealing off the Dermabond material or using Vaseline or naval jelly to remove.  If you have steri-strips over your incisions, you may remove the gauze bandage on the second day after surgery, and you may shower at that time.  Leave your steri-strips (small skin tapes) in place directly over the incision.  These strips should remain on the  skin for 5-7 days and then be removed.  You may get them wet in the shower and pat them dry.  Any sutures or staples will be removed at the office during your follow-up visit.  ACTIVITIES:  You may resume regular (light) daily activities beginning the next day - such as daily self-care, walking, climbing stairs - gradually increasing activities as tolerated.  You may have sexual intercourse when it is comfortable.  Refrain from any heavy lifting or straining until approved by your doctor.  You may drive when you are no longer taking prescription pain medication, when you can comfortably wear a seatbelt, and when you can safely maneuver your car and apply brakes.  You should see your doctor in the office for a follow-up appointment approximately 2-3 weeks after your surgery.  Make sure that you call for this appointment within a day or two after you arrive home to insure a convenient appointment time.  WHEN TO CALL YOUR DOCTOR: Fever over 101.0 Inability to urinate Continued bleeding from incision Increased pain, redness, or drainage from the incision Increasing abdominal pain  The clinic staff is available to answer your questions during regular business hours.  Please don't hesitate to call and ask to speak to one of the nurses for clinical concerns.  If you have a medical emergency, go to the nearest emergency room or call 911.  A surgeon from [facility] is always on call for the hospital.  Shakirah Kirkey, MD [facility] Office: [phone] Toll Free:    [phone] FAX [phone]  Website: [email or web address] 

## 2023-08-21 NOTE — Op Note (Signed)
OPERATIVE REPORT - LAPAROSCOPIC APPENDECTOMY  Preop diagnosis:  Acute appendicitis  Postop diagnosis:  same  Procedure:  Laparoscopic appendectomy  Surgeon:  Darnell Level, MD  Anesthesia:  general endotracheal  Estimated blood loss:  minimal  Preparation:  Chlora-prep  Complications:  none  Indications: Patient is a 42 year old female who presents to the emergency department with 2-week history of intermittent abdominal pain.  Evaluation shows a normal white blood cell count.  CT scan of the abdomen and pelvis shows a dilated appendix consistent with early acute appendicitis.  Patient now comes to surgery for appendectomy.  Procedure:  Patient was brought to the operating room and placed in a supine position on the operating room table. Following administration of general anesthesia, a time out was held and the patient's name and procedure was confirmed. Patient was then prepped and draped in the usual strict aseptic fashion.  After ascertaining that an adequate level of anesthesia had been achieved, a peri-umbilical incision was made with a #15 blade. Dissection was carried down to the fascia. Fascia was incised in the midline and the peritoneal cavity was entered cautiously. A #0-vicryl pursestring suture was placed in the fascia. An Hassan cannula was introduced under direct vision and secured with the pursestring suture. The abdomen was insufflated with carbon dioxide. The laparoscope was introduced and the abdomen was explored. Operative ports were placed in the right upper quadrant and left lower quadrant.  The abdomen is explored.  The liver appears normal.  Gallbladder appears normal.  Stomach is mildly dilated but normal.  Small bowel appears normal.  There is a small amount of physiologic serous fluid in the pelvis.  There are multiple cyst in the right ovary.  Ascending colon appears normal.  Cecum is grasped and retracted.  An enlarged elongated appendix is identified.  It appears  to be dilated.  It does not appear to be acutely inflamed.  Using the harmonic scalpel, the mesoappendix is taken down under direct vision.  Dissection is carried down to the base of the appendix.  Base of the appendix is transected at its junction with the cecal wall using an Endo GIA stapler.  Good hemostasis is achieved along the staple line using the harmonic scalpel.  The appendix was placed into an endo-catch bag and withdrawn through the umbilical port. The #0-vicryl pursestring suture was tied securely.  Right lower quadrant was irrigated with warm saline which was evacuated. Good hemostasis was noted. Ports were removed under direct vision. Good hemostasis was noted at the port sites. Pneumoperitoneum was released.  Skin incisions were anesthetized with local anesthetic. Wounds were closed with interrupted 4-0 Monocryl subcuticular sutures. Wounds were washed and dried and Dermabond was applied. The patient was awakened from anesthesia and brought to the recovery room. The patient tolerated the procedure well.  Darnell Level, MD [facility] Office: [phone]

## 2023-08-21 NOTE — Transfer of Care (Signed)
Immediate Anesthesia Transfer of Care Note  Patient: Belinda Mcintosh  Procedure(s) Performed: APPENDECTOMY LAPAROSCOPIC  Patient Location: PACU  Anesthesia Type:General  Level of Consciousness: drowsy and patient cooperative  Airway & Oxygen Therapy: Patient Spontanous Breathing and Patient connected to face mask oxygen  Post-op Assessment: Report given to RN and Post -op Vital signs reviewed and stable  Post vital signs: Reviewed and stable  Last Vitals:  Vitals Value Taken Time  BP 124/71 08/21/23 1425  Temp    Pulse 101 08/21/23 1428  Resp 20 08/21/23 1428  SpO2 100 % 08/21/23 1428  Vitals shown include unfiled device data.  Last Pain:  Vitals:   08/21/23 1042  TempSrc:   PainSc: 2          Complications: No notable events documented.

## 2023-08-21 NOTE — H&P (Addendum)
Belinda Mcintosh 11-12-1981  [id number].    Requesting MD: Dr. Linwood Dibbles Chief Complaint/Reason for Consult: Acute Appendicitis  HPI: Belinda Mcintosh is a 42 y.o. female who presented to the ED with outpatient CT scan that was concerning for possible appendicitis.  Patient reports that 2 weeks ago she began having umbilical pain with associated nausea.  1 week later her symptoms began radiating to her bilateral flanks.  She saw a provider as an outpatient for this and a CT A/P was ordered. She reports over the last 24 hours she has began having RLQ abdominal pain that was new with associated nausea.  Denies fever, vomiting, diarrhea, constipation or urinary symptoms. CT scan was done that showed findings suspicious for appendicitis without perforation or abscess.  WBC 5.7. Last p.o. intake was yesterday.  No prior abdominal surgeries.  She is not on a blood thinner.  Per notes patient developed allergic reaction to contrast dye versus antibiotics.  She was given H2 blocker, steroids and antihistamines with improvement.  Did not need EpiPen. Allergies have already been added to allergy list.   ROS: ROS As above, see hpi  Family History  Problem Relation Age of Onset   Hypertension Mother    Diabetes Mother    Breast cancer Mother        Breast Cancer   Cancer Father    Hypertension Father    Diabetes Father    Cancer Sister    Fibromyalgia Sister    Other Sister        spinal stenosis   Alcohol abuse Neg Hx    Arthritis Neg Hx    Asthma Neg Hx    COPD Neg Hx    Depression Neg Hx    Drug abuse Neg Hx    Early death Neg Hx    Heart disease Neg Hx    Hearing loss Neg Hx    Hyperlipidemia Neg Hx    Kidney disease Neg Hx    Learning disabilities Neg Hx    Mental illness Neg Hx    Mental retardation Neg Hx    Miscarriages / Stillbirths Neg Hx    Stroke Neg Hx    Vision loss Neg Hx    Varicose Veins Neg Hx     Past Medical History:  Diagnosis Date   Abdominal pain     Flank pain    History of kidney stones    Low back pain    Palpitations    Umbilical hernia 2018    Past Surgical History:  Procedure Laterality Date   COSMETIC SURGERY     INDUCED ABORTION     LIPOSUCTION     NOSE SURGERY      Social History:  reports that she quit smoking about 6 years ago. Her smoking use included cigarettes. She has never used smokeless tobacco. She reports that she does not drink alcohol and does not use drugs.  Allergies:  Allergies  Allergen Reactions   Ivp Dye [Iodinated Contrast Media] Anaphylaxis, Hives and Cough   Shrimp [Shellfish Allergy] Itching   Rocephin [Ceftriaxone] Hives    Facility-Administered Medications Prior to Admission  Medication Dose Route Frequency Provider Last Rate Last Admin   lidocaine (XYLOCAINE) 5 % ointment   Topical TID PRN Milas Hock, MD       Medications Prior to Admission  Medication Sig Dispense Refill   GEMTESA 75 MG TABS Take 1 tablet by mouth daily.     ibuprofen (ADVIL)  600 MG tablet Take 1 tablet (600 mg total) by mouth every 6 (six) hours as needed (pain). 40 tablet 0   acetaminophen (TYLENOL) 500 MG tablet Take 2 tablets (1,000 mg total) by mouth every 8 (eight) hours as needed (pain, headache, or fever). (Patient not taking: Reported on 08/20/2023) 60 tablet 0   hydrOXYzine (ATARAX) 25 MG tablet Take 25 mg by mouth 3 (three) times daily as needed. (Patient not taking: Reported on 08/20/2023)     ipratropium (ATROVENT) 0.03 % nasal spray SPRAY 1 SPRAY INTO BOTH NOSTRILS DAILY. (Patient not taking: Reported on 08/20/2023) 30 mL 5   lidocaine (XYLOCAINE) 2 % solution Use as directed 15 mLs in the mouth or throat as needed for mouth pain. (Patient not taking: Reported on 08/20/2023) 100 mL 0   lidocaine (XYLOCAINE) 5 % ointment Apply 1 application topically as needed. (Patient not taking: Reported on 05/17/2022) 35.44 g 0   ofloxacin (OCUFLOX) 0.3 % ophthalmic solution 1 drop every 4 (four) hours. (Patient not taking:  Reported on 08/20/2023)     pantoprazole (PROTONIX) 20 MG tablet Take 1 tablet (20 mg total) by mouth daily. (Patient not taking: Reported on 10/04/2021) 90 tablet 2   polyethylene glycol powder (GLYCOLAX/MIRALAX) 17 GM/SCOOP powder Take 17 g by mouth daily. One capful daily (Patient not taking: Reported on 10/14/2021) 289 g 1   Prenatal Vit-Fe Fumarate-FA (PRENATAL MULTIVITAMIN) TABS tablet Take 1 tablet by mouth daily at 12 noon. (Patient not taking: Reported on 11/08/2021)     traMADol (ULTRAM) 50 MG tablet Take 50 mg by mouth 4 (four) times daily as needed. (Patient not taking: Reported on 08/20/2023)       Physical Exam: Blood pressure 107/60, pulse 81, temperature 98.1 F (36.7 C), temperature source Oral, resp. rate 19, height 5\' 1"  (1.549 m), weight 64.5 kg, SpO2 95%, currently breastfeeding. General: pleasant, WD/WN female who is laying in bed in NAD HEENT: head is normocephalic, atraumatic. The patient has normal phonation and is in control of secretions. No stridor.  Midline uvula without edema. No lip swelling.  Heart: regular, rate, and rhythm.  Lungs: CTAB, no wheezes, rhonchi, or rales noted.  Respiratory effort nonlabored Abd: Soft, ND, RLQ > suprapubic ttp without rigidity or guarding. Otherwise NT.  +BS. No masses, hernias, or organomegaly MS: no BUE or BLE edema Skin: warm and dry, no obvious rash Psych: A&Ox4 with an appropriate affect Neuro:normal speech, thought process intact, moves all extremities, gait not assessed   Results for orders placed or performed during the hospital encounter of 08/20/23 (from the past 48 hour(s))  CBC with Differential     Status: Abnormal   Collection Time: 08/20/23  8:57 PM  Result Value Ref Range   WBC 7.9 4.0 - 10.5 K/uL   RBC 4.43 3.87 - 5.11 MIL/uL   Hemoglobin 13.2 12.0 - 15.0 g/dL   HCT 16.1 09.6 - 04.5 %   MCV 90.1 80.0 - 100.0 fL   MCH 29.8 26.0 - 34.0 pg   MCHC 33.1 30.0 - 36.0 g/dL   RDW 40.9 81.1 - 91.4 %   Platelets 139  (L) 150 - 400 K/uL   nRBC 0.0 0.0 - 0.2 %   Neutrophils Relative % 61 %   Neutro Abs 4.8 1.7 - 7.7 K/uL   Lymphocytes Relative 33 %   Lymphs Abs 2.6 0.7 - 4.0 K/uL   Monocytes Relative 5 %   Monocytes Absolute 0.4 0.1 - 1.0 K/uL   Eosinophils Relative 1 %  Eosinophils Absolute 0.1 0.0 - 0.5 K/uL   Basophils Relative 0 %   Basophils Absolute 0.0 0.0 - 0.1 K/uL   Immature Granulocytes 0 %   Abs Immature Granulocytes 0.01 0.00 - 0.07 K/uL    Comment: Performed at [facility], [address], [place], [place] [address]  Comprehensive metabolic panel     Status: Abnormal   Collection Time: 08/20/23  8:57 PM  Result Value Ref Range   Sodium 135 135 - 145 mmol/L   Potassium 3.5 3.5 - 5.1 mmol/L   Chloride 106 98 - 111 mmol/L   CO2 22 22 - 32 mmol/L   Glucose, Bld 93 70 - 99 mg/dL    Comment: Glucose reference range applies only to samples taken after fasting for at least 8 hours.   BUN 17 6 - 20 mg/dL   Creatinine, Ser 6.21 0.44 - 1.00 mg/dL   Calcium 8.6 (L) 8.9 - 10.3 mg/dL   Total Protein 7.7 6.5 - 8.1 g/dL   Albumin 4.1 3.5 - 5.0 g/dL   AST 15 15 - 41 U/L   ALT 24 0 - 44 U/L   Alkaline Phosphatase 50 38 - 126 U/L   Total Bilirubin 0.5 0.3 - 1.2 mg/dL   GFR, Estimated >30 >86 mL/min    Comment: (NOTE) Calculated using the CKD-EPI Creatinine Equation (2021)    Anion gap 7 5 - 15    Comment: Performed at [facility], [address] [place], [place] [address]  Lipase, blood     Status: None   Collection Time: 08/20/23  8:57 PM  Result Value Ref Range   Lipase 28 11 - 51 U/L    Comment: Performed at [facility], [address], [place], [place] [address]  hCG, serum, qualitative     Status: None   Collection Time: 08/20/23  8:57 PM  Result Value Ref Range   Preg, Serum NEGATIVE NEGATIVE    Comment:        THE SENSITIVITY OF THIS METHODOLOGY IS >10 mIU/mL. Performed at [facility], [address], [place], [place] [address]   I-stat chem 8, ED (not at [facility], [facility] or [facility])     Status: Abnormal   Collection Time: 08/20/23  9:06 PM  Result Value Ref Range   Sodium 139 135 - 145 mmol/L   Potassium 3.6 3.5 - 5.1 mmol/L   Chloride 105 98 - 111 mmol/L   BUN 16 6 - 20 mg/dL   Creatinine, Ser 2.44 0.44 - 1.00 mg/dL   Glucose, Bld 91 70 - 99 mg/dL    Comment: Glucose reference range applies only to samples taken after fasting for at least 8 hours.   Calcium, Ion 1.20 1.15 - 1.40 mmol/L   TCO2 21 (L) 22 - 32 mmol/L   Hemoglobin 13.6 12.0 - 15.0 g/dL   HCT 01.0 27.2 - 53.6 %  Urinalysis, Routine w reflex microscopic -Urine, Clean Catch     Status: Abnormal   Collection Time: 08/20/23 10:58 PM  Result Value Ref Range   Color, Urine COLORLESS (A) YELLOW   APPearance CLEAR CLEAR   Specific Gravity, Urine 1.044 (H) 1.005 - 1.030   pH 6.0 5.0 - 8.0   Glucose, UA NEGATIVE NEGATIVE mg/dL   Hgb urine dipstick NEGATIVE NEGATIVE   Bilirubin Urine NEGATIVE NEGATIVE   Ketones, ur NEGATIVE NEGATIVE mg/dL   Protein, ur NEGATIVE NEGATIVE mg/dL   Nitrite NEGATIVE NEGATIVE  Leukocytes,Ua MODERATE (A) NEGATIVE   RBC / HPF 0-5 0 - 5 RBC/hpf   WBC, UA 11-20 0 - 5 WBC/hpf   Bacteria, UA NONE SEEN NONE SEEN   Squamous Epithelial / HPF 6-10 0 - 5 /HPF    Comment: Performed at [facility], [address], [place], [place] [address]  CBC     Status: Abnormal   Collection Time: 08/21/23  5:27 AM  Result Value Ref Range   WBC 5.7 4.0 - 10.5 K/uL   RBC 4.39 3.87 - 5.11 MIL/uL   Hemoglobin 13.2 12.0 - 15.0 g/dL   HCT 91.4 78.2 - 95.6 %   MCV 88.8 80.0 - 100.0 fL   MCH 30.1 26.0 - 34.0 pg   MCHC 33.8 30.0 - 36.0 g/dL   RDW 21.3 08.6 - 57.8 %   Platelets 134 (L) 150 - 400 K/uL   nRBC 0.0 0.0 - 0.2 %    Comment: Performed at [facility], [address] [place], [place] [address]  Basic metabolic panel     Status: Abnormal   Collection Time: 08/21/23  5:27  AM  Result Value Ref Range   Sodium 137 135 - 145 mmol/L   Potassium 3.9 3.5 - 5.1 mmol/L   Chloride 107 98 - 111 mmol/L   CO2 19 (L) 22 - 32 mmol/L   Glucose, Bld 169 (H) 70 - 99 mg/dL    Comment: Glucose reference range applies only to samples taken after fasting for at least 8 hours.   BUN 15 6 - 20 mg/dL   Creatinine, Ser 9.52 0.44 - 1.00 mg/dL   Calcium 8.9 8.9 - 84.1 mg/dL   GFR, Estimated >32 >44 mL/min    Comment: (NOTE) Calculated using the CKD-EPI Creatinine Equation (2021)    Anion gap 11 5 - 15    Comment: Performed at [facility], [address], [place], [place] [address]   CT ABDOMEN PELVIS W CONTRAST  Result Date: 08/20/2023 CLINICAL DATA:  Right lower quadrant pain EXAM: CT ABDOMEN AND PELVIS WITH CONTRAST TECHNIQUE: Multidetector CT imaging of the abdomen and pelvis was performed using the standard protocol following bolus administration of intravenous contrast. RADIATION DOSE REDUCTION: This exam was performed according to the departmental dose-optimization program which includes automated exposure control, adjustment of the mA and/or kV according to patient size and/or use of iterative reconstruction technique. CONTRAST:  OMNIPAQUE IOHEXOL 300 MG/ML  SOLN COMPARISON:  CT 04/06/2020 FINDINGS: Lower chest: Lung bases demonstrate no acute airspace disease. Hepatobiliary: Hepatic steatosis. No calcified gallstone or biliary dilatation Pancreas: Unremarkable. No pancreatic ductal dilatation or surrounding inflammatory changes. Spleen: Normal in size without focal abnormality. Adrenals/Urinary Tract: Adrenal glands are unremarkable. Kidneys are normal, without renal calculi, focal lesion, or hydronephrosis. Bladder is unremarkable. Stomach/Bowel: The stomach is nonenlarged. No dilated small bowel. Appendix appears slightly enlarged, measuring up to 11 mm, mild periappendiceal stranding. No perforation or abscess. Rounded filling defects within the appendix  lumen could represent small appendicoliths. Vascular/Lymphatic: No significant vascular findings are present. No enlarged abdominal or pelvic lymph nodes. Reproductive: Uterus and bilateral adnexa are unremarkable. Other: No abdominal wall hernia or abnormality. No abdominopelvic ascites. Musculoskeletal: No acute or significant osseous findings. IMPRESSION: 1. Findings suspicious for early or mild acute appendicitis. No perforation or abscess. 2. Hepatic steatosis. Electronically Signed   By: Jasmine Pang M.D.   On: 08/20/2023 22:16    Anti-infectives (From admission, onward)    Start  Dose/Rate Route Frequency Ordered Stop   08/21/23 0000  ciprofloxacin (CIPRO) IVPB 400 mg        400 mg 200 mL/hr over 60 Minutes Intravenous Every 12 hours 08/20/23 2356     08/20/23 2330  cefTRIAXone (ROCEPHIN) 2 g in sodium chloride 0.9 % 100 mL IVPB  Status:  Discontinued       Placed in "And" Linked Group   2 g 200 mL/hr over 30 Minutes Intravenous Every 24 hours 08/20/23 2321 08/20/23 2356   08/20/23 2330  metroNIDAZOLE (FLAGYL) IVPB 500 mg       Placed in "And" Linked Group   500 mg 100 mL/hr over 60 Minutes Intravenous Every 12 hours 08/20/23 2321 08/27/23 2329       Assessment/Plan RLQ pain, Acute Appendicitis  This is a 42 y.o. female with 1 day of RLQ pain with associated nausea. Exam with RLQ ttp without rigidity or guarding. CT c/w acute appendicitis with 11 mm appendix and periappendiceal stranding. Radiology questions possible small appendicoliths. No evidence of perforation or abscess on CT. Discussed operative vs non-operative intervention.  I have explained the procedure, risks, and aftercare of Laparoscopic Appendectomy.  Risks include but are not limited to anesthesia (MI, CVA, death, aspiration, prolonged intubation), bleeding, infection, wound problems, hernia, injury to surrounding structures (viscus, nerves, blood vessels, ureter), need for ileocecectomy, post operative ileus or  abscess, stump leak, and increased risk of DVT/PE.  She seems to understand and agrees to proceed with surgery. Keep NPO. Cont IV abx. To note patient asks about other long term abdominal symptoms - consider GI f/u as outpatient for further evaluation, workup, +/- colonoscopy.   FEN - NPO VTE - SCDs, Lovenox ID - Cipro/Flagyl Foley - None Dispo - Admit to observation.   I reviewed nursing notes, last 24 h vitals and pain scores, last 48 h intake and output, last 24 h labs and trends, and last 24 h imaging results.  Jacinto Halim, [facility] 08/21/2023, 8:36 AM Please see Amion for pager number during day hours 7:00am-4:30pm

## 2023-08-22 ENCOUNTER — Encounter (HOSPITAL_COMMUNITY): Payer: Self-pay | Admitting: Surgery

## 2023-08-23 LAB — SURGICAL PATHOLOGY

## 2023-08-27 ENCOUNTER — Emergency Department (HOSPITAL_COMMUNITY): Payer: Commercial Managed Care - HMO

## 2023-08-27 ENCOUNTER — Other Ambulatory Visit: Payer: Self-pay

## 2023-08-27 ENCOUNTER — Encounter (HOSPITAL_COMMUNITY): Payer: Self-pay

## 2023-08-27 ENCOUNTER — Emergency Department (HOSPITAL_COMMUNITY)
Admission: EM | Admit: 2023-08-27 | Discharge: 2023-08-27 | Disposition: A | Discharge status: Home / Self Care | Payer: Commercial Managed Care - HMO | Attending: Emergency Medicine | Admitting: Emergency Medicine

## 2023-08-27 DIAGNOSIS — R0789 Other chest pain: Secondary | ICD-10-CM | POA: Insufficient documentation

## 2023-08-27 LAB — CBC WITH DIFFERENTIAL/PLATELET
Abs Immature Granulocytes: 0.01 10*3/uL (ref 0.00–0.07)
Basophils Absolute: 0 10*3/uL (ref 0.0–0.1)
Basophils Relative: 0 %
Eosinophils Absolute: 0.1 10*3/uL (ref 0.0–0.5)
Eosinophils Relative: 1 %
HCT: 41.4 % (ref 36.0–46.0)
Hemoglobin: 13.5 g/dL (ref 12.0–15.0)
Immature Granulocytes: 0 %
Lymphocytes Relative: 33 %
Lymphs Abs: 2.5 10*3/uL (ref 0.7–4.0)
MCH: 29.5 pg (ref 26.0–34.0)
MCHC: 32.6 g/dL (ref 30.0–36.0)
MCV: 90.6 fL (ref 80.0–100.0)
Monocytes Absolute: 0.5 10*3/uL (ref 0.1–1.0)
Monocytes Relative: 6 %
Neutro Abs: 4.5 10*3/uL (ref 1.7–7.7)
Neutrophils Relative %: 60 %
Platelets: 160 10*3/uL (ref 150–400)
RBC: 4.57 MIL/uL (ref 3.87–5.11)
RDW: 12.7 % (ref 11.5–15.5)
WBC: 7.6 10*3/uL (ref 4.0–10.5)
nRBC: 0 % (ref 0.0–0.2)

## 2023-08-27 LAB — COMPREHENSIVE METABOLIC PANEL
ALT: 31 U/L (ref 0–44)
AST: 17 U/L (ref 15–41)
Albumin: 4.3 g/dL (ref 3.5–5.0)
Alkaline Phosphatase: 59 U/L (ref 38–126)
Anion gap: 9 (ref 5–15)
BUN: 14 mg/dL (ref 6–20)
CO2: 22 mmol/L (ref 22–32)
Calcium: 9.2 mg/dL (ref 8.9–10.3)
Chloride: 105 mmol/L (ref 98–111)
Creatinine, Ser: 0.7 mg/dL (ref 0.44–1.00)
GFR, Estimated: >60 mL/min (ref >60)
Glucose, Bld: 103 mg/dL — ABNORMAL HIGH (ref 70–99)
Potassium: 3.8 mmol/L (ref 3.5–5.1)
Sodium: 136 mmol/L (ref 135–145)
Total Bilirubin: 0.4 mg/dL (ref 0.3–1.2)
Total Protein: 7.8 g/dL (ref 6.5–8.1)

## 2023-08-27 LAB — D-DIMER, QUANTITATIVE: D-Dimer, Quant: <0.27 ug{FEU}/mL (ref 0.00–0.50)

## 2023-08-27 LAB — HCG, SERUM, QUALITATIVE: Preg, Serum: NEGATIVE

## 2023-08-27 LAB — TROPONIN I (HIGH SENSITIVITY): Troponin I (High Sensitivity): <2 ng/L (ref <18)

## 2023-08-27 MED ORDER — IBUPROFEN 800 MG PO TABS: 800.0000 mg | ORAL_TABLET | Freq: Three times a day (TID) | ORAL | 0 refills | Status: DC

## 2023-08-27 MED ORDER — IBUPROFEN 800 MG PO TABS
800.0000 mg | ORAL_TABLET | Freq: Once | ORAL | Status: AC
  Administered 2023-08-27: 800 mg via ORAL
  Filled 2023-08-27: qty 1

## 2023-08-27 NOTE — Discharge Instructions (Addendum)
As we discussed, your labs were normal today.  I do not think you have a heart attack or blood clots.  You can take ibuprofen 800 mg 3 times daily as needed.  See your doctor for follow-up  Return to ER if you have worse chest pain or shortness of breath

## 2023-08-27 NOTE — ED Provider Notes (Signed)
[name]   CSN: [id number] Arrival date & time: 08/27/23  1759     History  Chief Complaint  Patient presents with   Chest Pain    Belinda Mcintosh is a 42 y.o. female history of recent appendectomy here presenting with chest pain.  She was driving and had right-sided chest pain.  It was pleuritic in nature.  Patient has no history of heart disease.  The history is provided by the patient.       Home Medications Prior to Admission medications   Medication Sig Start Date End Date Taking? Authorizing Provider  acetaminophen (TYLENOL) 500 MG tablet Take 2 tablets (1,000 mg total) by mouth every 8 (eight) hours as needed (pain, headache, or fever). Patient not taking: Reported on 08/20/2023 08/22/21   Worthy Rancher, MD  GEMTESA 75 MG TABS Take 1 tablet by mouth daily.    [provider]  hydrOXYzine (ATARAX) 25 MG tablet Take 25 mg by mouth 3 (three) times daily as needed. Patient not taking: Reported on 08/20/2023 06/03/23   [provider]  ibuprofen (ADVIL) 600 MG tablet Take 1 tablet (600 mg total) by mouth every 6 (six) hours as needed (pain). 08/22/21   Worthy Rancher, MD  ipratropium (ATROVENT) 0.03 % nasal spray SPRAY 1 SPRAY INTO BOTH NOSTRILS DAILY. Patient not taking: Reported on 08/20/2023 05/18/22   Charlott Holler, MD  lidocaine (XYLOCAINE) 2 % solution Use as directed 15 mLs in the mouth or throat as needed for mouth pain. Patient not taking: Reported on 08/20/2023 05/18/22   Barrie Dunker B, PA-C  lidocaine (XYLOCAINE) 5 % ointment Apply 1 application topically as needed. Patient not taking: Reported on 05/17/2022 11/10/21   Milas Hock, MD  ofloxacin (OCUFLOX) 0.3 % ophthalmic solution 1 drop every 4 (four) hours. Patient not taking: Reported on 08/20/2023 06/14/23   [provider]  pantoprazole (PROTONIX) 20 MG tablet Take 1 tablet (20 mg total) by mouth daily. Patient not  taking: Reported on 10/04/2021 04/22/21   Reva Bores, MD  polyethylene glycol powder (GLYCOLAX/MIRALAX) 17 GM/SCOOP powder Take 17 g by mouth daily. One capful daily Patient not taking: Reported on 10/14/2021 10/04/21   Allayne Stack, DO  Prenatal Vit-Fe Fumarate-FA (PRENATAL MULTIVITAMIN) TABS tablet Take 1 tablet by mouth daily at 12 noon. Patient not taking: Reported on 11/08/2021    [provider]  traMADol (ULTRAM) 50 MG tablet Take 50 mg by mouth 4 (four) times daily as needed. Patient not taking: Reported on 08/20/2023 06/14/23   [provider]  traMADol (ULTRAM) 50 MG tablet Take 1 tablet (50 mg total) by mouth every 6 (six) hours as needed for moderate pain. 08/21/23   Darnell Level, MD      Allergies    Ivp dye [iodinated contrast media], Shrimp [shellfish allergy], and Rocephin [ceftriaxone]    Review of Systems   Review of Systems  Cardiovascular:  Positive for chest pain.  All other systems reviewed and are negative.   Physical Exam Updated Vital Signs BP (!) 129/95 (BP Location: Left Arm)   Pulse 87   Temp 98.2 F (36.8 C) (Oral)   Resp 16   Ht 5\' 1"  (1.549 m)   Wt 64.5 kg   LMP 07/15/2023 (Approximate) Comment: Negative (-) serum pregnancy test on 08/20/23  SpO2 100%   BMI 26.87 kg/m  Physical Exam Vitals and nursing note reviewed.  Constitutional:  Appearance: She is well-developed.  HENT:     Head: Normocephalic.  Eyes:     Extraocular Movements: Extraocular movements intact.     Pupils: Pupils are equal, round, and reactive to light.  Cardiovascular:     Rate and Rhythm: Normal rate and regular rhythm.     Heart sounds: Normal heart sounds.  Pulmonary:     Comments: Patient has reproducible tenderness of the right chest wall. Abdominal:     General: Bowel sounds are normal.     Palpations: Abdomen is soft.  Musculoskeletal:        General: Normal range of motion.     Cervical back: Normal range of motion and neck supple.   Skin:    General: Skin is warm.     Capillary Refill: Capillary refill takes less than 2 seconds.  Neurological:     General: No focal deficit present.     Mental Status: She is alert and oriented to person, place, and time.  Psychiatric:        Mood and Affect: Mood normal.        Behavior: Behavior normal.     ED Results / Procedures / Treatments   Labs (all labs ordered are listed, but only abnormal results are displayed) Labs Reviewed  CBC WITH DIFFERENTIAL/PLATELET  HCG, SERUM, QUALITATIVE  D-DIMER, QUANTITATIVE  COMPREHENSIVE METABOLIC PANEL  TROPONIN I (HIGH SENSITIVITY)  TROPONIN I (HIGH SENSITIVITY)    EKG EKG Interpretation Date/Time:  Monday August 27 2023 18:12:16 EDT Ventricular Rate:  82 PR Interval:  141 QRS Duration:  78 QT Interval:  372 QTC Calculation: 435 R Axis:   52  Text Interpretation: Sinus rhythm Low voltage, precordial leads Abnormal inferior Q waves Borderline T abnormalities, anterior leads No significant change since last tracing Confirmed by Richardean Canal [phone]) on 08/27/2023 7:58:50 PM  Radiology No results found.  Procedures Procedures    Medications Ordered in ED Medications  ibuprofen (ADVIL) tablet 800 mg (800 mg Oral Given 08/27/23 1816)    ED Course/ Medical Decision Making/ A&P                                 Medical Decision Making Belinda Mcintosh is a 42 y.o. female here presenting with chest pain.  Patient does have reproducible chest wall tenderness.  But she did have a recent appendectomy so consider PE.  Low suspicion for ACS.  Plan to get CBC CMP and 1 set of troponin and D-dimer and chest x-ray.  Will give ibuprofen and reassess.  8:31 PM I reviewed patient's labs and independently interpreted chest x-ray.  D-dimer is negative and  troponin normal.  Patient felt better after ibuprofen.  Likely muscle strain.  Stable for discharge  Problems Addressed: Chest wall pain: acute illness or injury  Amount and/or  Complexity of Data Reviewed Labs: ordered. Decision-making details documented in ED Course. Radiology: ordered and independent interpretation performed. Decision-making details documented in ED Course.  Risk Prescription drug management.    Final Clinical Impression(s) / ED Diagnoses Final diagnoses:  None    Rx / DC Orders ED Discharge Orders     None         Charlynne Pander, MD 08/27/23 2032

## 2023-08-27 NOTE — ED Provider Triage Note (Signed)
Emergency Medicine Provider Triage Evaluation Note  Belinda Mcintosh , a 42 y.o. female  was evaluated in triage.  Pt complains of chest pain. She was driving and then had R sided chest pain, pleuritic in nature. Had recent appendectomy   Review of Systems  Positive: Chest pain Negative: Fever, cough  Physical Exam  BP (!) 129/95 (BP Location: Left Arm)   Pulse 87   Temp 98.2 F (36.8 C) (Oral)   Resp 16   Ht 5\' 1"  (1.549 m)   Wt 64.5 kg   LMP 07/15/2023 (Approximate) Comment: Negative (-) serum pregnancy test on 08/20/23  SpO2 100%   BMI 26.87 kg/m  Gen:   Awake, no distress   Resp:  Normal effort, reproducible R chest wall tenderness  MSK:   Moves extremities without difficulty  Other:    Medical Decision Making  Medically screening exam initiated at 6:14 PM.  Appropriate orders placed.  [name] was informed that the remainder of the evaluation will be completed by another provider, this initial triage assessment does not replace that evaluation, and the importance of remaining in the ED until their evaluation is complete.  [name] is a 42 y.o. female here with chest pain. Had recent appendectomy. Pain is pleuritic. Will get labs, trop, d-dimer, cxr. Will give motrin     Charlynne Pander, MD 08/27/23 [phone]

## 2023-08-27 NOTE — ED Triage Notes (Signed)
Pt had appendix removed a few days ago. Pt reports right sided chest pain.

## 2023-08-28 NOTE — Discharge Summary (Addendum)
[facility] Discharge Summary   Patient ID: Casee Knepp MRN: [id number] DOB/AGE: [birth year] 42 y.o.  Admit date: 08/20/2023 Discharge date: 08/21/2023  Admitting Diagnosis: Acute appendicitis [K35.80] Allergic reaction to contrast material, initial encounter [T50.8X5A] Abdominal pain, unspecified abdominal location [R10.9]   Discharge Diagnosis Acute appendicitis [K35.80] Allergic reaction to contrast material, initial encounter [T50.8X5A] Abdominal pain, unspecified abdominal location [R10.9]   Consultants None  Imaging: DG Chest 2 View  Result Date: 08/27/2023 CLINICAL DATA:  chest pain EXAM: CHEST - 2 VIEW COMPARISON:  01/31/2021. FINDINGS: Cardiac silhouette is unremarkable. No pneumothorax or pleural effusion. The lungs are clear. Note is made of pectus excavatum deformity. No acute osseous abnormalities. IMPRESSION: No acute cardiopulmonary process.  Pectus excavatum noted. Electronically Signed   By: Layla Maw M.D.   On: 08/27/2023 21:12    Procedures Dr. Gerrit Friends (08/21/23) - Laparoscopic Appendectomy  Hospital Course:  42 y.o. female who presented to the ED with abdominal pain.  Workup showed acute appendicitis.  Patient was admitted to observation and underwent procedure listed above.  Tolerated procedure well and was transferred to the the med-surg floor.  She was observed and felt stable for discharge home.  Patient will follow up in our office in 3 weeks and knows to call with questions or concerns.    I or a member of my team have reviewed this patient in the Controlled Substance Database.   Allergies as of 08/21/2023       Reactions   Ivp Dye [iodinated Contrast Media] Anaphylaxis, Hives, Cough   Shrimp [shellfish Allergy] Itching   Rocephin [ceftriaxone] Hives        Medication List     TAKE these medications    acetaminophen 500 MG tablet Commonly known as: TYLENOL Take 2 tablets (1,000 mg total) by mouth every 8 (eight) hours  as needed (pain, headache, or fever).   Gemtesa 75 MG Tabs Generic drug: Vibegron Take 1 tablet by mouth daily.   hydrOXYzine 25 MG tablet Commonly known as: ATARAX Take 25 mg by mouth 3 (three) times daily as needed.   ipratropium 0.03 % nasal spray Commonly known as: ATROVENT SPRAY 1 SPRAY INTO BOTH NOSTRILS DAILY.   lidocaine 2 % solution Commonly known as: XYLOCAINE Use as directed 15 mLs in the mouth or throat as needed for mouth pain.   lidocaine 5 % ointment Commonly known as: XYLOCAINE Apply 1 application topically as needed.   ofloxacin 0.3 % ophthalmic solution Commonly known as: OCUFLOX 1 drop every 4 (four) hours.   pantoprazole 20 MG tablet Commonly known as: Protonix Take 1 tablet (20 mg total) by mouth daily.   polyethylene glycol powder 17 GM/SCOOP powder Commonly known as: GLYCOLAX/MIRALAX Take 17 g by mouth daily. One capful daily   prenatal multivitamin Tabs tablet Take 1 tablet by mouth daily at 12 noon.   traMADol 50 MG tablet Commonly known as: ULTRAM Take 50 mg by mouth 4 (four) times daily as needed. What changed: Another medication with the same name was added. Make sure you understand how and when to take each.   traMADol 50 MG tablet Commonly known as: ULTRAM Take 1 tablet (50 mg total) by mouth every 6 (six) hours as needed for moderate pain. What changed: You were already taking a medication with the same name, and this prescription was added. Make sure you understand how and when to take each.               Discharge Care  Instructions  (From admission, onward)           Start     Ordered   08/21/23 0000  No dressing needed        08/21/23 1432              Follow-up Information     Darnell Level, MD. Go on 09/11/2023.   Specialty: General Surgery Why: follow up on 10/29 at 3:15pm., Please arrive 30 minutes early to complete check in, and bring photo ID and insurance card. Contact information: [address] [place] [address] [phone]                 Signed: Clarise Cruz [facility] Surgery 08/28/2023, 3:06 PM Please see Amion for pager number during day hours 7:00am-4:30pm

## 2023-09-04 ENCOUNTER — Emergency Department (HOSPITAL_COMMUNITY)
Admission: EM | Admit: 2023-09-04 | Discharge: 2023-09-04 | Discharge status: Against Medical Advice | Payer: Managed Care, Other (non HMO) | Attending: Emergency Medicine | Admitting: Emergency Medicine

## 2023-09-04 ENCOUNTER — Other Ambulatory Visit: Payer: Self-pay

## 2023-09-04 ENCOUNTER — Encounter (HOSPITAL_COMMUNITY): Payer: Self-pay

## 2023-09-04 DIAGNOSIS — Z5321 Procedure and treatment not carried out due to patient leaving prior to being seen by health care provider: Secondary | ICD-10-CM | POA: Diagnosis not present

## 2023-09-04 DIAGNOSIS — R109 Unspecified abdominal pain: Secondary | ICD-10-CM | POA: Insufficient documentation

## 2023-09-04 DIAGNOSIS — R42 Dizziness and giddiness: Secondary | ICD-10-CM | POA: Insufficient documentation

## 2023-09-04 LAB — BASIC METABOLIC PANEL
Anion gap: 7 (ref 5–15)
BUN: 9 mg/dL (ref 6–20)
CO2: 24 mmol/L (ref 22–32)
Calcium: 8.7 mg/dL — ABNORMAL LOW (ref 8.9–10.3)
Chloride: 105 mmol/L (ref 98–111)
Creatinine, Ser: 0.53 mg/dL (ref 0.44–1.00)
GFR, Estimated: >60 mL/min (ref >60)
Glucose, Bld: 95 mg/dL (ref 70–99)
Potassium: 3.8 mmol/L (ref 3.5–5.1)
Sodium: 136 mmol/L (ref 135–145)

## 2023-09-04 LAB — CBC
HCT: 40.8 % (ref 36.0–46.0)
Hemoglobin: 13.4 g/dL (ref 12.0–15.0)
MCH: 30 pg (ref 26.0–34.0)
MCHC: 32.8 g/dL (ref 30.0–36.0)
MCV: 91.5 fL (ref 80.0–100.0)
Platelets: 130 10*3/uL — ABNORMAL LOW (ref 150–400)
RBC: 4.46 MIL/uL (ref 3.87–5.11)
RDW: 13 % (ref 11.5–15.5)
WBC: 6.2 10*3/uL (ref 4.0–10.5)
nRBC: 0 % (ref 0.0–0.2)

## 2023-09-04 LAB — URINALYSIS, ROUTINE W REFLEX MICROSCOPIC
Bilirubin Urine: NEGATIVE
Glucose, UA: NEGATIVE mg/dL
Hgb urine dipstick: NEGATIVE
Ketones, ur: NEGATIVE mg/dL
Leukocytes,Ua: NEGATIVE
Nitrite: NEGATIVE
Protein, ur: NEGATIVE mg/dL
Specific Gravity, Urine: 1.014 (ref 1.005–1.030)
pH: 5 (ref 5.0–8.0)

## 2023-09-04 LAB — HCG, SERUM, QUALITATIVE: Preg, Serum: NEGATIVE

## 2023-09-04 NOTE — ED Triage Notes (Signed)
Pt to ED by POV from home with c/o R sided flank pain. Pt states she had her appendix removed two weeks ago and that the pain she was experiencing has not gotten any better. Pt also states that she has been having intermittent dizziness for the past 2 months which she has seen her pcp for. Pt states she is supposed to have an MRI done, but hasn't gotten around to it. No neuro deficits noted in triage. Arrives A+O, VSS.

## 2023-09-04 NOTE — ED Notes (Signed)
EMT first informed RN that patient left.

## 2023-09-11 ENCOUNTER — Other Ambulatory Visit: Payer: Self-pay | Admitting: Physician Assistant

## 2023-09-11 DIAGNOSIS — Z1231 Encounter for screening mammogram for malignant neoplasm of breast: Secondary | ICD-10-CM

## 2023-09-13 ENCOUNTER — Encounter: Payer: Self-pay | Admitting: Radiology

## 2023-09-13 ENCOUNTER — Ambulatory Visit
Admission: RE | Admit: 2023-09-13 | Discharge: 2023-09-13 | Disposition: A | Discharge status: Home / Self Care | Payer: Managed Care, Other (non HMO) | Source: Ambulatory Visit | Attending: Physician Assistant | Admitting: Physician Assistant

## 2023-09-13 DIAGNOSIS — Z1231 Encounter for screening mammogram for malignant neoplasm of breast: Secondary | ICD-10-CM

## 2023-09-18 ENCOUNTER — Other Ambulatory Visit: Payer: Self-pay | Admitting: Physician Assistant

## 2023-09-18 DIAGNOSIS — R928 Other abnormal and inconclusive findings on diagnostic imaging of breast: Secondary | ICD-10-CM

## 2023-09-23 ENCOUNTER — Other Ambulatory Visit (HOSPITAL_BASED_OUTPATIENT_CLINIC_OR_DEPARTMENT_OTHER): Payer: Self-pay | Admitting: Physician Assistant

## 2023-09-23 DIAGNOSIS — R1010 Upper abdominal pain, unspecified: Secondary | ICD-10-CM

## 2023-09-23 DIAGNOSIS — M545 Low back pain, unspecified: Secondary | ICD-10-CM

## 2023-09-24 ENCOUNTER — Encounter (HOSPITAL_COMMUNITY): Payer: Self-pay | Admitting: Emergency Medicine

## 2023-09-24 ENCOUNTER — Emergency Department (HOSPITAL_COMMUNITY): Payer: Commercial Managed Care - HMO

## 2023-09-24 ENCOUNTER — Emergency Department (HOSPITAL_COMMUNITY)
Admission: EM | Admit: 2023-09-24 | Discharge: 2023-09-24 | Disposition: A | Discharge status: Home / Self Care | Payer: Commercial Managed Care - HMO | Attending: Emergency Medicine | Admitting: Emergency Medicine

## 2023-09-24 DIAGNOSIS — M545 Low back pain, unspecified: Secondary | ICD-10-CM | POA: Diagnosis not present

## 2023-09-24 DIAGNOSIS — M549 Dorsalgia, unspecified: Secondary | ICD-10-CM | POA: Diagnosis present

## 2023-09-24 MED ORDER — METAXALONE 800 MG PO TABS: 800.0000 mg | ORAL_TABLET | Freq: Three times a day (TID) | ORAL | 0 refills | Status: DC

## 2023-09-24 MED ORDER — LIDOCAINE 5 % EX PTCH: 1.0000 | MEDICATED_PATCH | CUTANEOUS | 0 refills | Status: DC

## 2023-09-24 NOTE — ED Notes (Signed)
Patient wanting to leave AMA at this time. MD notified.

## 2023-09-24 NOTE — ED Provider Notes (Signed)
[place] EMERGENCY DEPARTMENT AT [facility] Provider Note   CSN: [id number] Arrival date & time: 09/24/23  1403     History  Chief Complaint  Patient presents with   Back Pain   Leg Pain    Belinda Mcintosh is a 42 y.o. female.  38 female presents with several month history of pain to her right flank.  Denies any severe urinary symptoms with the pain no fever or chills.  No rashes to the area.  Pain characterized as sharp and worse with movements.  Denies any radicular symptoms.  Has used other medications without relief.  Concern for possible kidney involvement as she states that when she drinks coffee gets worse.  Was seen by her doctor here to have a renal ultrasound.       Home Medications Prior to Admission medications   Medication Sig Start Date End Date Taking? Authorizing Provider  acetaminophen (TYLENOL) 500 MG tablet Take 2 tablets (1,000 mg total) by mouth every 8 (eight) hours as needed (pain, headache, or fever). Patient not taking: Reported on 08/20/2023 08/22/21   Worthy Rancher, MD  GEMTESA 75 MG TABS Take 1 tablet by mouth daily.    [provider]  hydrOXYzine (ATARAX) 25 MG tablet Take 25 mg by mouth 3 (three) times daily as needed. Patient not taking: Reported on 08/20/2023 06/03/23   [provider]  ibuprofen (ADVIL) 800 MG tablet Take 1 tablet (800 mg total) by mouth 3 (three) times daily. 08/27/23   Charlynne Pander, MD  ipratropium (ATROVENT) 0.03 % nasal spray SPRAY 1 SPRAY INTO BOTH NOSTRILS DAILY. Patient not taking: Reported on 08/20/2023 05/18/22   Charlott Holler, MD  lidocaine (XYLOCAINE) 2 % solution Use as directed 15 mLs in the mouth or throat as needed for mouth pain. Patient not taking: Reported on 08/20/2023 05/18/22   Barrie Dunker B, PA-C  lidocaine (XYLOCAINE) 5 % ointment Apply 1 application topically as needed. Patient not taking: Reported on 05/17/2022 11/10/21   Milas Hock, MD  ofloxacin (OCUFLOX) 0.3  % ophthalmic solution 1 drop every 4 (four) hours. Patient not taking: Reported on 08/20/2023 06/14/23   [provider]  pantoprazole (PROTONIX) 20 MG tablet Take 1 tablet (20 mg total) by mouth daily. Patient not taking: Reported on 10/04/2021 04/22/21   Reva Bores, MD  polyethylene glycol powder (GLYCOLAX/MIRALAX) 17 GM/SCOOP powder Take 17 g by mouth daily. One capful daily Patient not taking: Reported on 10/14/2021 10/04/21   Allayne Stack, DO  Prenatal Vit-Fe Fumarate-FA (PRENATAL MULTIVITAMIN) TABS tablet Take 1 tablet by mouth daily at 12 noon. Patient not taking: Reported on 11/08/2021    [provider]  traMADol (ULTRAM) 50 MG tablet Take 50 mg by mouth 4 (four) times daily as needed. Patient not taking: Reported on 08/20/2023 06/14/23   [provider]  traMADol (ULTRAM) 50 MG tablet Take 1 tablet (50 mg total) by mouth every 6 (six) hours as needed for moderate pain. 08/21/23   Darnell Level, MD      Allergies    Ivp dye [iodinated contrast media], Shrimp [shellfish allergy], and Rocephin [ceftriaxone]    Review of Systems   Review of Systems  All other systems reviewed and are negative.   Physical Exam Updated Vital Signs BP 104/63 (BP Location: Right Arm)   Pulse 77   Temp 98.3 F (36.8 C)   Resp 16   SpO2 98%  Physical Exam Vitals and nursing note reviewed.  Constitutional:      General: She is not in acute distress.    Appearance: Normal appearance. She is well-developed. She is not toxic-appearing.  HENT:     Head: Normocephalic and atraumatic.  Eyes:     General: Lids are normal.     Conjunctiva/sclera: Conjunctivae normal.     Pupils: Pupils are equal, round, and reactive to light.  Neck:     Thyroid: No thyroid mass.     Trachea: No tracheal deviation.  Cardiovascular:     Rate and Rhythm: Normal rate and regular rhythm.     Heart sounds: Normal heart sounds. No murmur heard.    No gallop.  Pulmonary:     Effort: Pulmonary  effort is normal. No respiratory distress.     Breath sounds: Normal breath sounds. No stridor. No decreased breath sounds, wheezing, rhonchi or rales.  Abdominal:     General: There is no distension.     Palpations: Abdomen is soft.     Tenderness: There is no abdominal tenderness. There is no rebound.  Musculoskeletal:        General: No tenderness. Normal range of motion.     Cervical back: Normal range of motion and neck supple.       Back:  Skin:    General: Skin is warm and dry.     Findings: No abrasion or rash.  Neurological:     Mental Status: She is alert and oriented to person, place, and time. Mental status is at baseline.     GCS: GCS eye subscore is 4. GCS verbal subscore is 5. GCS motor subscore is 6.     Cranial Nerves: Cranial nerves are intact. No cranial nerve deficit.     Sensory: No sensory deficit.     Motor: Motor function is intact.  Psychiatric:        Attention and Perception: Attention normal.        Speech: Speech normal.        Behavior: Behavior normal.     ED Results / Procedures / Treatments   Labs (all labs ordered are listed, but only abnormal results are displayed) Labs Reviewed  URINALYSIS, ROUTINE W REFLEX MICROSCOPIC    EKG None  Radiology No results found.  Procedures Procedures    Medications Ordered in ED Medications - No data to display  ED Course/ Medical Decision Making/ A&P                                 Medical Decision Making  Patient seen by her physician recently and had negative urinalysis 2 days ago.  Renal ultrasound is pending at this time.  She is requesting to leave at this time and will follow-up on results in MyChart.  Will prescribe Skelaxin and lidocaine patches        Final Clinical Impression(s) / ED Diagnoses Final diagnoses:  None    Rx / DC Orders ED Discharge Orders     None         Lorre Nick, MD 09/24/23 1728

## 2023-09-24 NOTE — ED Triage Notes (Signed)
Pt here asking for an renal US that she says she can't wait for , has had pain in her lower back and freq urination has had several ua that have all been fine

## 2023-09-27 ENCOUNTER — Other Ambulatory Visit (HOSPITAL_BASED_OUTPATIENT_CLINIC_OR_DEPARTMENT_OTHER): Payer: Self-pay | Admitting: Physician Assistant

## 2023-09-27 DIAGNOSIS — M545 Low back pain, unspecified: Secondary | ICD-10-CM

## 2023-10-03 ENCOUNTER — Ambulatory Visit
Admission: RE | Admit: 2023-10-03 | Discharge: 2023-10-03 | Disposition: A | Discharge status: Home / Self Care | Payer: Managed Care, Other (non HMO) | Source: Ambulatory Visit | Attending: Physician Assistant | Admitting: Physician Assistant

## 2023-10-03 ENCOUNTER — Other Ambulatory Visit: Payer: Self-pay | Admitting: Physician Assistant

## 2023-10-03 ENCOUNTER — Other Ambulatory Visit: Payer: Managed Care, Other (non HMO)

## 2023-10-03 DIAGNOSIS — N6310 Unspecified lump in the right breast, unspecified quadrant: Secondary | ICD-10-CM

## 2023-10-03 DIAGNOSIS — R928 Other abnormal and inconclusive findings on diagnostic imaging of breast: Secondary | ICD-10-CM

## 2023-10-05 ENCOUNTER — Ambulatory Visit (HOSPITAL_BASED_OUTPATIENT_CLINIC_OR_DEPARTMENT_OTHER): Payer: Commercial Managed Care - HMO

## 2023-10-05 ENCOUNTER — Ambulatory Visit (HOSPITAL_BASED_OUTPATIENT_CLINIC_OR_DEPARTMENT_OTHER)
Admission: RE | Admit: 2023-10-05 | Discharge: 2023-10-05 | Disposition: A | Discharge status: Home / Self Care | Payer: Commercial Managed Care - HMO | Source: Ambulatory Visit | Attending: Physician Assistant | Admitting: Physician Assistant

## 2023-10-05 DIAGNOSIS — M545 Low back pain, unspecified: Secondary | ICD-10-CM | POA: Diagnosis present

## 2023-10-05 DIAGNOSIS — R1010 Upper abdominal pain, unspecified: Secondary | ICD-10-CM | POA: Insufficient documentation

## 2023-10-15 ENCOUNTER — Other Ambulatory Visit: Payer: Self-pay | Admitting: Physician Assistant

## 2023-10-15 ENCOUNTER — Encounter: Payer: Self-pay | Admitting: Neurology

## 2023-10-15 DIAGNOSIS — R42 Dizziness and giddiness: Secondary | ICD-10-CM

## 2023-10-18 ENCOUNTER — Emergency Department (HOSPITAL_COMMUNITY)
Admission: EM | Admit: 2023-10-18 | Discharge: 2023-10-19 | Disposition: A | Discharge status: Home / Self Care | Payer: Commercial Managed Care - HMO | Attending: Emergency Medicine | Admitting: Emergency Medicine

## 2023-10-18 DIAGNOSIS — Z5321 Procedure and treatment not carried out due to patient leaving prior to being seen by health care provider: Secondary | ICD-10-CM | POA: Insufficient documentation

## 2023-10-18 DIAGNOSIS — T887XXA Unspecified adverse effect of drug or medicament, initial encounter: Secondary | ICD-10-CM | POA: Diagnosis not present

## 2023-10-18 DIAGNOSIS — F419 Anxiety disorder, unspecified: Secondary | ICD-10-CM | POA: Diagnosis present

## 2023-10-18 NOTE — ED Triage Notes (Signed)
Pt reports anxiety tonight that she thinks may be brought on by a combination of new medication. Pt reports taking abx for infection in nose, gabapentin and magnesium over the counter. Pt denies any pain at present.

## 2023-10-20 ENCOUNTER — Telehealth: Payer: Commercial Managed Care - HMO | Admitting: Nurse Practitioner

## 2023-10-20 DIAGNOSIS — K1379 Other lesions of oral mucosa: Secondary | ICD-10-CM

## 2023-10-20 MED ORDER — CHLORHEXIDINE GLUCONATE 0.12 % MT SOLN: 15.0000 mL | Freq: Two times a day (BID) | OROMUCOSAL | 0 refills | Status: DC

## 2023-10-20 NOTE — Progress Notes (Signed)
I have spent 5 minutes in review of e-visit questionnaire, review and updating patient chart, medical decision making and response to patient.  ° °Jerrell Mangel W Secilia Apps, NP ° °  °

## 2023-10-20 NOTE — Progress Notes (Signed)
  We are sorry that you are not feeling well.  Here is how we plan to help!  Based on what you have shared with me in the questionnaire, it does not appear you have yeast on your tongue or in your throat.    I have prescribed a prescription mouth solution for you to use    It is imperative that you see a dentist for a full oral exam   HOME CARE:   Over-the-counter pain medications such as acetaminophen or ibuprofen may be used. Take these as directed on the package while you arrange for a dental appointment. Avoid very cold or hot foods, because they may make the pain worse.      GET HELP RIGHT AWAY IF:  You have a high fever or chills If you have had a recent head or face injury and develop headache, light headedness, nausea, vomiting, or other symptoms that concern you after an injury to your face or mouth, you could have a more serious injury in addition to your dental injury. A facial rash associated with a toothache: This condition may improve with medication. Contact your doctor for them to decide what is appropriate. Any jaw pain occurring with chest pain: Although jaw pain is most commonly caused by dental disease, it is sometimes referred pain from other areas. People with heart disease, especially people who have had stents placed, people with diabetes, or those who have had heart surgery may have jaw pain as a symptom of heart attack or angina. If your jaw or tooth pain is associated with lightheadedness, sweating, or shortness of breath, you should see a doctor as soon as possible. Trouble swallowing or excessive pain or bleeding from gums: If you have a history of a weakened immune system, diabetes, or steroid use, you may be more susceptible to infections. Infections can often be more severe and extensive or caused by unusual organisms. Dental and gum infections in people with these conditions may require more aggressive treatment. An abscess may need draining or IV  antibiotics, for example.  MAKE SURE YOU   Understand these instructions. Will watch your condition. Will get help right away if you are not doing well or get worse.  Thank you for choosing an e-visit.  Your e-visit answers were reviewed by a board certified advanced clinical practitioner to complete your personal care plan. Depending upon the condition, your plan could have included both over the counter or prescription medications.  Please review your pharmacy choice. Make sure the pharmacy is open so you can pick up prescription now. If there is a problem, you may contact your provider through [employer] and have the prescription routed to another pharmacy.  Your safety is important to [place]. If you have drug allergies check your prescription carefully.   For the next 24 hours you can use MyChart to ask questions about today's visit, request a non-urgent call back, or ask for a work or school excuse. You will get an email in the next two days asking about your experience. I hope that your e-visit has been valuable and will speed your recovery.

## 2023-10-25 ENCOUNTER — Encounter: Payer: Self-pay | Admitting: Physician Assistant

## 2023-11-01 DIAGNOSIS — Z9049 Acquired absence of other specified parts of digestive tract: Secondary | ICD-10-CM | POA: Insufficient documentation

## 2023-11-01 HISTORY — DX: Acquired absence of other specified parts of digestive tract: Z90.49

## 2023-11-03 ENCOUNTER — Ambulatory Visit
Admission: RE | Admit: 2023-11-03 | Discharge: 2023-11-03 | Disposition: A | Discharge status: Home / Self Care | Payer: Commercial Managed Care - HMO | Source: Ambulatory Visit | Attending: Physician Assistant | Admitting: Physician Assistant

## 2023-11-03 DIAGNOSIS — R42 Dizziness and giddiness: Secondary | ICD-10-CM

## 2023-11-12 NOTE — Progress Notes (Signed)
 Initial neurology clinic note  Belinda Mcintosh MRN: [id number] DOB: 03-11-1981  Referring provider: Darra Hamilton, PA-C  Primary care provider: Darra Hamilton, PA-C  Reason for consult:  lightheadedness/dizziness, hand paresthesias, back pain  Subjective:  This is Ms. Belinda Mcintosh, a 42 y.o. right-handed female with a medical history of kidney stones who presents to neurology clinic with lightheadedness/dizziness, hand paresthesias, back pain. The patient is alone today. Spanish interpretor was offered but declined by patient.  Patient has a long history of low back pain (> 10 years ago). She was in [place] and told it was probably a pinched nerve in back. She was started on gabapentin which resolved pain, so she stopped. She moved to [place] . She had cystitis and started on Gemtesa. This did not help. She also mentioned that her hands had numbness and tingling in her hands while pregnant. This started around 2022. She also had return of back pain. She was put on tramadol  and gabapentin. She felt these medications caused side effects and depression, so she stopped.   She started working out about 1 month ago and stretching her back. She noticed her back pain greatly improved. This also resolved the symptoms in her hands. She believes that there were pinched nerves.   She continues to have dizziness and confusion though. She describes it as her eyes are heavy. She feels like she is a little bit drunk, like she has been drinking. Or like she was hit on the head and stunned afterward. She denies headache but has tension in her neck. She denies room spinning or boat rocking sensation.   She thinks she has lost weight recently, but not sure. She denies fevers or night sweats. Per 05/17/22 office visit note, her weight was similar to today.  EtOH use: none Restrictive diet: none Family history: sister with fibromyalgia, brother with sciatica, other sister with a lot of nerve problems but doesn't  remember name  She lives with her sister and 2 children. She owns a used programme researcher, broadcasting/film[employer]video.  MEDICATIONS:  Outpatient Encounter Medications as of 11/16/2023  Medication Sig   [DISCONTINUED] traMADol  (ULTRAM ) 50 MG tablet Take 1 tablet (50 mg total) by mouth every 6 (six) hours as needed for moderate pain.   acetaminophen  (TYLENOL ) 500 MG tablet Take 2 tablets (1,000 mg total) by mouth every 8 (eight) hours as needed (pain, headache, or fever). (Patient not taking: Reported on 11/16/2023)   chlorhexidine  (PERIDEX ) 0.12 % solution Use as directed 15 mLs in the mouth or throat 2 (two) times daily. (Patient not taking: Reported on 11/16/2023)   GEMTESA 75 MG TABS Take 1 tablet by mouth daily. (Patient not taking: Reported on 11/16/2023)   hydrOXYzine  (ATARAX ) 25 MG tablet Take 25 mg by mouth 3 (three) times daily as needed. (Patient not taking: Reported on 11/16/2023)   ibuprofen  (ADVIL ) 800 MG tablet Take 1 tablet (800 mg total) by mouth 3 (three) times daily. (Patient not taking: Reported on 11/16/2023)   ipratropium (ATROVENT ) 0.03 % nasal spray SPRAY 1 SPRAY INTO BOTH NOSTRILS DAILY. (Patient not taking: Reported on 11/16/2023)   lidocaine  (LIDODERM ) 5 % Place 1 patch onto the skin daily. Remove & Discard patch within 12 hours or as directed by MD (Patient not taking: Reported on 11/16/2023)   lidocaine  (XYLOCAINE ) 2 % solution Use as directed 15 mLs in the mouth or throat as needed for mouth pain. (Patient not taking: Reported on 11/16/2023)   lidocaine  (XYLOCAINE ) 5 % ointment Apply 1 application  topically as needed. (Patient not taking: Reported on 05/17/2022)   metaxalone  (SKELAXIN ) 800 MG tablet Take 1 tablet (800 mg total) by mouth 3 (three) times daily. (Patient not taking: Reported on 11/16/2023)   ofloxacin (OCUFLOX) 0.3 % ophthalmic solution 1 drop every 4 (four) hours. (Patient not taking: Reported on 08/20/2023)   pantoprazole  (PROTONIX ) 20 MG tablet Take 1 tablet (20 mg total) by mouth daily. (Patient not  taking: Reported on 10/04/2021)   polyethylene glycol powder (GLYCOLAX /MIRALAX ) 17 GM/SCOOP powder Take 17 g by mouth daily. One capful daily (Patient not taking: Reported on 10/14/2021)   Prenatal Vit-Fe Fumarate-FA (PRENATAL MULTIVITAMIN) TABS tablet Take 1 tablet by mouth daily at 12 noon. (Patient not taking: Reported on 11/08/2021)   traMADol  (ULTRAM ) 50 MG tablet Take 50 mg by mouth 4 (four) times daily as needed. (Patient not taking: Reported on 08/20/2023)   Facility-Administered Encounter Medications as of 11/16/2023  Medication   lidocaine  (XYLOCAINE ) 5 % ointment    PAST MEDICAL HISTORY: Past Medical History:  Diagnosis Date   Abdominal pain    Flank pain    History of kidney stones    Low back pain    Palpitations    Umbilical hernia 2018    PAST SURGICAL HISTORY: Past Surgical History:  Procedure Laterality Date   COSMETIC SURGERY     INDUCED ABORTION     LAPAROSCOPIC APPENDECTOMY N/A 08/21/2023   Procedure: APPENDECTOMY LAPAROSCOPIC;  Surgeon: Eletha Boas, MD;  Location: [facility];  Service: General;  Laterality: N/A;   LIPOSUCTION     NOSE SURGERY      ALLERGIES: Allergies  Allergen Reactions   Ivp Dye [Iodinated Contrast Media] Anaphylaxis, Hives and Cough   Shrimp [Shellfish Allergy] Itching   Gabapentin Itching    Depression and itching   Rocephin  [Ceftriaxone ] Hives   Tramadol  Anxiety    FAMILY HISTORY: Family History  Problem Relation Age of Onset   Hypertension Mother    Diabetes Mother    Breast cancer Mother        Breast Cancer   Cancer Father    Hypertension Father    Diabetes Father    Cancer Sister    Fibromyalgia Sister    Other Sister        spinal stenosis   Alcohol abuse Neg Hx    Arthritis Neg Hx    Asthma Neg Hx    COPD Neg Hx    Depression Neg Hx    Drug abuse Neg Hx    Early death Neg Hx    Heart disease Neg Hx    Hearing loss Neg Hx    Hyperlipidemia Neg Hx    Kidney disease Neg Hx    Learning disabilities Neg Hx     Mental illness Neg Hx    Mental retardation Neg Hx    Miscarriages / Stillbirths Neg Hx    Stroke Neg Hx    Vision loss Neg Hx    Varicose Veins Neg Hx     SOCIAL HISTORY: Social History   Tobacco Use   Smoking status: Former    Current packs/day: 0.00    Types: Cigarettes    Quit date: 09/21/2016    Years since quitting: 7.1    Passive exposure: Never   Smokeless tobacco: Never  Vaping Use   Vaping status: Never Used  Substance Use Topics   Alcohol use: No   Drug use: No   Social History   Social History Narrative   ** Merged  History Encounter **    Are you right handed or left handed? right   Are you currently employed ?    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   Who lives with you? Kids and sister   What type of home do you live in: 1 story or 2 story? two   Caffeine none     Objective:  Vital Signs:  BP 117/78   Pulse 65   Ht 5' 1 (1.549 m)   Wt 141 lb (64 kg)   LMP 10/14/2023 (Approximate)   SpO2 98%   BMI 26.64 kg/m   General: No acute distress.  Patient appears well-groomed.   Head:  Normocephalic/atraumatic Neck: supple, positive for mild paraspinal tenderness Heart: regular rate and rhythm Lungs: Clear to auscultation bilaterally. Vascular: No carotid bruits.  Neurological Exam: Mental status: alert and oriented, speech fluent and not dysarthric, language intact.  Cranial nerves CN I: not tested CN II: pupils equal, round and reactive to light, visual fields intact CN III, IV, VI:  full range of motion, no nystagmus, no ptosis CN V: facial sensation intact. CN VII: upper and lower face symmetric CN VIII: hearing intact CN IX, X: uvula midline CN XI: sternocleidomastoid and trapezius muscles intact CN XII: tongue midline  Bulk & Tone: normal, no fasciculations. Motor:  muscle strength 5/5 throughout Deep Tendon Reflexes:  2+ throughout,  toes downgoing.   Sensation:  Pinprick and vibratory sensation intact.  Negative Tinel's and Phalen tests. Finger to nose testing:  Without dysmetria.    Gait:  Normal station and stride.  Romberg negative.   Labs and Imaging review: Internal labs: Lab Results  Component Value Date   HGBA1C 5.4 05/17/2022   Lab Results  Component Value Date   VITAMINB12 603 09/17/2020   Lab Results  Component Value Date   TSH 1.320 09/17/2020   Lab Results  Component Value Date   ESRSEDRATE 30 11/18/2020   09/04/23: CBC significant for platelets 130 (chronic) BMP unremarkable  Imaging: MRI brain wo contrast (11/03/23): Pending radiology read (very poor quality)  Assessment/Plan:  Belinda Mcintosh is a 42 y.o. female who presents for evaluation of lightheadedness, hand numbness/tingling, and low back pain. She has a relevant medical history of kidney stones. Her neurological examination is essentially normal today. The etiology of patient's symptoms is currently unclear. She does have neck tightness and tenderness to palpation. She noticed that since exercising and stretching that her low back pain and hand numbness and tingling has improved, but not the intermittent lightheadedness. I will send labs looking for treatable causes and refer to PT to see if this can help her further. I will consider further work up if this does not help.  PLAN: -Blood work: B1, B12, vit D, TSH -Will follow up on MRI brain read -Will refer to PT for cervicalgia and chronic low back -Will consider MRI of spine or EMG if symptoms worsen or persist  -Return to clinic if symptoms worsen or fail to improve  The impression above as well as the plan as outlined below were extensively discussed with the patient who voiced understanding. All questions were answered to their satisfaction.  When available, results of the above investigations and possible further recommendations will be communicated to the patient via telephone/MyChart. Patient to call office if not contacted after expected  testing turnaround time.   Total time spent reviewing records, interview, history/exam, documentation, and coordination of care on day  of encounter:  55 min   Thank you for allowing me to participate in patient's care.  If I can answer any additional questions, I would be pleased to do so.  Venetia Potters, MD   CC: Long, Fielding, PA-C [address] [place] [address]  CC: Referring provider: Darra Hamilton, PA-C [address] [place],  [place] [address]

## 2023-11-16 ENCOUNTER — Ambulatory Visit: Payer: No Typology Code available for payment source | Admitting: Neurology

## 2023-11-16 ENCOUNTER — Encounter: Payer: Self-pay | Admitting: Neurology

## 2023-11-16 ENCOUNTER — Other Ambulatory Visit: Payer: No Typology Code available for payment source

## 2023-11-16 VITALS — BP 117/78 | HR 65 | Ht 61.0 in | Wt 141.0 lb

## 2023-11-16 DIAGNOSIS — R5383 Other fatigue: Secondary | ICD-10-CM

## 2023-11-16 DIAGNOSIS — R42 Dizziness and giddiness: Secondary | ICD-10-CM | POA: Diagnosis not present

## 2023-11-16 DIAGNOSIS — R209 Unspecified disturbances of skin sensation: Secondary | ICD-10-CM

## 2023-11-16 DIAGNOSIS — M545 Low back pain, unspecified: Secondary | ICD-10-CM | POA: Diagnosis not present

## 2023-11-16 DIAGNOSIS — G8929 Other chronic pain: Secondary | ICD-10-CM

## 2023-11-16 DIAGNOSIS — M542 Cervicalgia: Secondary | ICD-10-CM | POA: Diagnosis not present

## 2023-11-16 NOTE — Patient Instructions (Addendum)
 I saw you today for hand pain, back pain, and lightheadedness. This may be due to tightness of your neck and back. I would like to send you to physical therapy to see if this helps your symptoms. Continue to exercise and stretch as you have been doing as well.  I will also get some blood work today to look for low vitamins or thyroid  issues.  I will also follow up on your MRI brain.  I will be in touch when I have those results.  Please let me know if you have any questions or concerns in the meantime.   The physicians and staff at [facility] are committed to providing excellent care. You may receive a survey requesting feedback about your experience at our office. We strive to receive very good responses to the survey questions. If you feel that your experience would prevent you from giving the office a very good  response, please contact our office to try to remedy the situation. We may be reached at [phone]. Thank you for taking the time out of your busy day to complete the survey.  Venetia Potters, MD [facility]

## 2023-11-20 ENCOUNTER — Other Ambulatory Visit (HOSPITAL_COMMUNITY)
Admission: RE | Admit: 2023-11-20 | Discharge: 2023-11-20 | Disposition: A | Discharge status: Home / Self Care | Payer: No Typology Code available for payment source | Source: Ambulatory Visit | Attending: Certified Nurse Midwife | Admitting: Certified Nurse Midwife

## 2023-11-20 ENCOUNTER — Encounter (HOSPITAL_BASED_OUTPATIENT_CLINIC_OR_DEPARTMENT_OTHER): Payer: Self-pay | Admitting: Certified Nurse Midwife

## 2023-11-20 ENCOUNTER — Ambulatory Visit (HOSPITAL_BASED_OUTPATIENT_CLINIC_OR_DEPARTMENT_OTHER): Payer: No Typology Code available for payment source | Admitting: Certified Nurse Midwife

## 2023-11-20 VITALS — BP 108/66 | HR 70 | Ht 61.0 in | Wt 140.6 lb

## 2023-11-20 DIAGNOSIS — R102 Pelvic and perineal pain unspecified side: Secondary | ICD-10-CM | POA: Insufficient documentation

## 2023-11-20 DIAGNOSIS — Z23 Encounter for immunization: Secondary | ICD-10-CM | POA: Diagnosis not present

## 2023-11-20 DIAGNOSIS — Z124 Encounter for screening for malignant neoplasm of cervix: Secondary | ICD-10-CM

## 2023-11-20 DIAGNOSIS — Z01411 Encounter for gynecological examination (general) (routine) with abnormal findings: Secondary | ICD-10-CM

## 2023-11-20 DIAGNOSIS — N898 Other specified noninflammatory disorders of vagina: Secondary | ICD-10-CM | POA: Diagnosis not present

## 2023-11-20 DIAGNOSIS — Z0000 Encounter for general adult medical examination without abnormal findings: Secondary | ICD-10-CM

## 2023-11-20 DIAGNOSIS — Z113 Encounter for screening for infections with a predominantly sexual mode of transmission: Secondary | ICD-10-CM | POA: Diagnosis not present

## 2023-11-20 HISTORY — DX: Pelvic and perineal pain: R10.2

## 2023-11-20 NOTE — Progress Notes (Signed)
 43 y.o. [id number] Divorced Other or two or more races female here for annual exam. She lives with her 2 children, son is 2 and daughter is interior and spatial designer. Pt reports regular monthly periods. Reports occasional vaginal odor that is bothersome to her. She also reports I think I may have a cyst on my right ovary. Reports some occasional right pelvic pain over my right ovary.   She and spouse are divorced and she is concerned that he may have possibly cheated on her during their marriage. She is agreeable to STD screening.   Patient's last menstrual period was 11/17/2023 (approximate).          Sexually active: not currently  The current method of family planning is abstinence.    Exercising: Yes.     Smoker:  no  Health Maintenance: Pap:  Collected today History of abnormal Pap:  no MMG:  Mammogram UTD, has 6 month follow-up scheduled Colonoscopy:  Plan at age 36 BMD:   n/a Screening Labs: Pt desires to establish care with Thersia Stark FNP.    reports that she quit smoking about 7 years ago. Her smoking use included cigarettes. She has never been exposed to tobacco smoke. She has never used smokeless tobacco. She reports that she does not drink alcohol and does not use drugs.  Past Medical History:  Diagnosis Date   Abdominal pain    Flank pain    History of kidney stones    Low back pain    Palpitations    Umbilical hernia 2018    Past Surgical History:  Procedure Laterality Date   COSMETIC SURGERY     INDUCED ABORTION     LAPAROSCOPIC APPENDECTOMY N/A 08/21/2023   Procedure: APPENDECTOMY LAPAROSCOPIC;  Surgeon: Eletha Boas, MD;  Location: [facility];  Service: General;  Laterality: N/A;   LIPOSUCTION     NOSE SURGERY      Current Outpatient Medications  Medication Sig Dispense Refill   acetaminophen  (TYLENOL ) 500 MG tablet Take 2 tablets (1,000 mg total) by mouth every 8 (eight) hours as needed (pain, headache, or fever). (Patient not taking: Reported on 11/16/2023) 60 tablet 0    chlorhexidine  (PERIDEX ) 0.12 % solution Use as directed 15 mLs in the mouth or throat 2 (two) times daily. (Patient not taking: Reported on 11/20/2023) 300 mL 0   GEMTESA 75 MG TABS Take 1 tablet by mouth daily. (Patient not taking: Reported on 11/16/2023)     hydrOXYzine  (ATARAX ) 25 MG tablet Take 25 mg by mouth 3 (three) times daily as needed. (Patient not taking: Reported on 11/16/2023)     ibuprofen  (ADVIL ) 800 MG tablet Take 1 tablet (800 mg total) by mouth 3 (three) times daily. (Patient not taking: Reported on 11/20/2023) 21 tablet 0   ipratropium (ATROVENT ) 0.03 % nasal spray SPRAY 1 SPRAY INTO BOTH NOSTRILS DAILY. (Patient not taking: No sig reported) 30 mL 5   lidocaine  (LIDODERM ) 5 % Place 1 patch onto the skin daily. Remove & Discard patch within 12 hours or as directed by MD (Patient not taking: Reported on 11/20/2023) 30 patch 0   lidocaine  (XYLOCAINE ) 2 % solution Use as directed 15 mLs in the mouth or throat as needed for mouth pain. (Patient not taking: Reported on 11/16/2023) 100 mL 0   lidocaine  (XYLOCAINE ) 5 % ointment Apply 1 application topically as needed. (Patient not taking: Reported on 05/17/2022) 35.44 g 0   metaxalone  (SKELAXIN ) 800 MG tablet Take 1 tablet (800 mg total) by mouth 3 (three)  times daily. (Patient not taking: Reported on 11/20/2023) 21 tablet 0   ofloxacin (OCUFLOX) 0.3 % ophthalmic solution 1 drop every 4 (four) hours. (Patient not taking: Reported on 08/20/2023)     pantoprazole  (PROTONIX ) 20 MG tablet Take 1 tablet (20 mg total) by mouth daily. (Patient not taking: Reported on 10/04/2021) 90 tablet 2   polyethylene glycol powder (GLYCOLAX /MIRALAX ) 17 GM/SCOOP powder Take 17 g by mouth daily. One capful daily (Patient not taking: Reported on 10/14/2021) 289 g 1   Prenatal Vit-Fe Fumarate-FA (PRENATAL MULTIVITAMIN) TABS tablet Take 1 tablet by mouth daily at 12 noon. (Patient not taking: Reported on 11/08/2021)     traMADol  (ULTRAM ) 50 MG tablet Take 50 mg by mouth 4 (four)  times daily as needed. (Patient not taking: Reported on 08/20/2023)     Current Facility-Administered Medications  Medication Dose Route Frequency Provider Last Rate Last Admin   lidocaine  (XYLOCAINE ) 5 % ointment   Topical TID PRN Cleatus Moccasin, MD        Family History  Problem Relation Age of Onset   Hypertension Mother    Diabetes Mother    Breast cancer Mother        Breast Cancer   Cancer Father    Hypertension Father    Diabetes Father    Cancer Sister    Fibromyalgia Sister    Other Sister        spinal stenosis   Alcohol abuse Neg Hx    Arthritis Neg Hx    Asthma Neg Hx    COPD Neg Hx    Depression Neg Hx    Drug abuse Neg Hx    Early death Neg Hx    Heart disease Neg Hx    Hearing loss Neg Hx    Hyperlipidemia Neg Hx    Kidney disease Neg Hx    Learning disabilities Neg Hx    Mental illness Neg Hx    Mental retardation Neg Hx    Miscarriages / Stillbirths Neg Hx    Stroke Neg Hx    Vision loss Neg Hx    Varicose Veins Neg Hx     ROS: Constitutional: negative Genitourinary:negative  Exam:   BP 108/66 (BP Location: Right Arm, Patient Position: Sitting, Cuff Size: Normal)   Pulse 70   Ht 5' 1 (1.549 m)   Wt 140 lb 9.6 oz (63.8 kg)   LMP 11/17/2023 (Approximate)   BMI 26.57 kg/m   Height: 5' 1 (154.9 cm)  General appearance: alert, cooperative and appears stated age Head: Normocephalic, without obvious abnormality, atraumatic Lungs: clear to auscultation bilaterally Breasts: normal appearance, no masses or tenderness, Inspection negative, No nipple retraction or dimpling, No nipple discharge or bleeding, No axillary or supraclavicular adenopathy, Normal to palpation without dominant masses Heart: regular rate and rhythm Abdomen: soft, non-tender; bowel sounds normal; no masses,  no organomegaly Extremities: extremities normal, atraumatic, no cyanosis or edema Skin: Skin color, texture, turgor normal. No rashes or lesions Lymph nodes: Cervical,  supraclavicular, and axillary nodes normal. No abnormal inguinal nodes palpated Neurologic: Grossly normal   Pelvic: External genitalia:  no lesions              Urethra:  normal appearing urethra with no masses, tenderness or lesions              Bartholins and Skenes: normal                 Vagina: normal appearing vagina with normal color and  no discharge, no lesions              Cervix: multiparous appearance              Pap taken: Yes.   Bimanual Exam:  Uterus:  normal size, contour, position, consistency, mobility, non-tender              Adnexa: normal ovaries palpated on the left Mild right adnexal enlargement              Anus:  normal sphincter tone, no lesions  Chaperone, Tempie Chancy, CMA, was present for exam.  Assessment/Plan:  1. Encounter for gynecological examination with abnormal finding - Right adnexal tenderness. RTO for Pelvic US  next available to evaluate uterus/ovaries.   2. Need for influenza vaccination (Primary) - Flu vaccine trivalent PF, 6mos and older(Flulaval,Afluria,Fluarix,Fluzone)  3. Vaginal odor - Cervicovaginal ancillary only( [place])  4. Screening examination for STI - Cervicovaginal ancillary only( [place]) - Hepatitis B Surface AntiGEN - Hepatitis C Antibody - HSV 1 and 2 Ab, IgG - RPR - HIV antibody (with reflex)  5. Screening for cervical cancer - Cytology - PAP( )  6. Physical exam - Ambulatory referral to [facility]  7. Pelvic pain - Pelvic US  pending - Routine STI screening  Arland POUR Kipton Skillen

## 2023-11-21 ENCOUNTER — Encounter (HOSPITAL_BASED_OUTPATIENT_CLINIC_OR_DEPARTMENT_OTHER): Payer: Self-pay | Admitting: Physical Therapy

## 2023-11-21 ENCOUNTER — Ambulatory Visit (HOSPITAL_BASED_OUTPATIENT_CLINIC_OR_DEPARTMENT_OTHER): Payer: No Typology Code available for payment source | Attending: Neurology | Admitting: Physical Therapy

## 2023-11-21 ENCOUNTER — Other Ambulatory Visit (HOSPITAL_BASED_OUTPATIENT_CLINIC_OR_DEPARTMENT_OTHER): Payer: Self-pay | Admitting: Certified Nurse Midwife

## 2023-11-21 ENCOUNTER — Telehealth: Payer: Self-pay | Admitting: Neurology

## 2023-11-21 ENCOUNTER — Other Ambulatory Visit: Payer: Self-pay

## 2023-11-21 DIAGNOSIS — M5459 Other low back pain: Secondary | ICD-10-CM | POA: Insufficient documentation

## 2023-11-21 DIAGNOSIS — G8929 Other chronic pain: Secondary | ICD-10-CM

## 2023-11-21 DIAGNOSIS — R42 Dizziness and giddiness: Secondary | ICD-10-CM

## 2023-11-21 DIAGNOSIS — M542 Cervicalgia: Secondary | ICD-10-CM | POA: Diagnosis present

## 2023-11-21 DIAGNOSIS — M62838 Other muscle spasm: Secondary | ICD-10-CM | POA: Diagnosis not present

## 2023-11-21 DIAGNOSIS — H539 Unspecified visual disturbance: Secondary | ICD-10-CM

## 2023-11-21 DIAGNOSIS — M545 Low back pain, unspecified: Secondary | ICD-10-CM | POA: Diagnosis present

## 2023-11-21 LAB — CERVICOVAGINAL ANCILLARY ONLY
Bacterial Vaginitis (gardnerella): POSITIVE — AB
Candida Glabrata: NEGATIVE
Candida Vaginitis: NEGATIVE
Chlamydia: NEGATIVE
Comment: NEGATIVE
Comment: NEGATIVE
Comment: NEGATIVE
Comment: NEGATIVE
Comment: NEGATIVE
Comment: NORMAL
Neisseria Gonorrhea: NEGATIVE
Trichomonas: NEGATIVE

## 2023-11-21 LAB — VITAMIN B1: Vitamin B1 (Thiamine): 9 nmol/L (ref 8–30)

## 2023-11-21 LAB — TSH: TSH: 0.88 m[IU]/L

## 2023-11-21 LAB — VITAMIN B12: Vitamin B-12: 497 pg/mL (ref 200–1100)

## 2023-11-21 LAB — VITAMIN D 25 HYDROXY (VIT D DEFICIENCY, FRACTURES): Vit D, 25-Hydroxy: 33 ng/mL (ref 30–100)

## 2023-11-21 MED ORDER — METRONIDAZOLE 500 MG PO TABS: 500.0000 mg | ORAL_TABLET | Freq: Two times a day (BID) | ORAL | 3 refills | Status: DC

## 2023-11-21 NOTE — Telephone Encounter (Signed)
 Called patient to discuss results of MRI brain that was recently read but from 11/03/23. 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 Again, given the quality of the MRI, this is not clear and certainly not diagnostic. We discussed and agreed to repeat the study and include cervical spine. I will get these with and without contrast. She had a previous reaction to CT iodine contrast, but has never had received or remember a reaction to gadolinium.  All questions were answered.  Venetia Potters, MD [facility]
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
## 2023-11-22 ENCOUNTER — Encounter (HOSPITAL_BASED_OUTPATIENT_CLINIC_OR_DEPARTMENT_OTHER): Payer: Self-pay | Admitting: Physical Therapy

## 2023-11-22 ENCOUNTER — Other Ambulatory Visit: Payer: Self-pay

## 2023-11-22 LAB — CYTOLOGY - PAP
Comment: NEGATIVE
Diagnosis: NEGATIVE
High risk HPV: NEGATIVE

## 2023-11-23 ENCOUNTER — Ambulatory Visit (HOSPITAL_BASED_OUTPATIENT_CLINIC_OR_DEPARTMENT_OTHER): Payer: No Typology Code available for payment source | Admitting: Physical Therapy

## 2023-11-23 ENCOUNTER — Encounter (HOSPITAL_BASED_OUTPATIENT_CLINIC_OR_DEPARTMENT_OTHER): Payer: Self-pay | Admitting: Certified Nurse Midwife

## 2023-11-23 DIAGNOSIS — M62838 Other muscle spasm: Secondary | ICD-10-CM

## 2023-11-23 DIAGNOSIS — M5459 Other low back pain: Secondary | ICD-10-CM

## 2023-11-23 DIAGNOSIS — M542 Cervicalgia: Secondary | ICD-10-CM | POA: Diagnosis not present

## 2023-11-24 ENCOUNTER — Ambulatory Visit (HOSPITAL_BASED_OUTPATIENT_CLINIC_OR_DEPARTMENT_OTHER): Payer: No Typology Code available for payment source

## 2023-11-25 ENCOUNTER — Encounter (HOSPITAL_BASED_OUTPATIENT_CLINIC_OR_DEPARTMENT_OTHER): Payer: Self-pay | Admitting: Physical Therapy
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
## 2023-11-28 ENCOUNTER — Ambulatory Visit (HOSPITAL_BASED_OUTPATIENT_CLINIC_OR_DEPARTMENT_OTHER): Admitting: Certified Nurse Midwife

## 2023-11-28 ENCOUNTER — Ambulatory Visit (HOSPITAL_BASED_OUTPATIENT_CLINIC_OR_DEPARTMENT_OTHER): Payer: Self-pay | Admitting: Physical Therapy

## 2023-11-28 DIAGNOSIS — M542 Cervicalgia: Secondary | ICD-10-CM | POA: Diagnosis not present

## 2023-11-28 DIAGNOSIS — M5459 Other low back pain: Secondary | ICD-10-CM

## 2023-11-28 DIAGNOSIS — M62838 Other muscle spasm: Secondary | ICD-10-CM
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
## 2023-11-29 ENCOUNTER — Encounter (HOSPITAL_BASED_OUTPATIENT_CLINIC_OR_DEPARTMENT_OTHER): Payer: Self-pay | Admitting: Physical Therapy

## 2023-11-30 ENCOUNTER — Telehealth: Payer: Self-pay | Admitting: Neurology

## 2023-11-30 ENCOUNTER — Encounter: Payer: Self-pay | Admitting: Neurology

## 2023-11-30 ENCOUNTER — Other Ambulatory Visit: Payer: Self-pay

## 2023-11-30 DIAGNOSIS — R209 Unspecified disturbances of skin sensation: Secondary | ICD-10-CM

## 2023-11-30 DIAGNOSIS — M542 Cervicalgia: Secondary | ICD-10-CM

## 2023-11-30 DIAGNOSIS — H539 Unspecified visual disturbance: Secondary | ICD-10-CM

## 2023-11-30 DIAGNOSIS — R42 Dizziness and giddiness: Secondary | ICD-10-CM

## 2023-11-30 DIAGNOSIS — R5383 Other fatigue: Secondary | ICD-10-CM

## 2023-11-30 DIAGNOSIS — M545 Low back pain, unspecified: Secondary | ICD-10-CM

## 2023-11-30 NOTE — Telephone Encounter (Signed)
Pt came by the office today and states that she is still very dizziness and would need to be referred elsewhere for the MRI   [facility] imaging does not take her insurance

## 2023-11-30 NOTE — Telephone Encounter (Signed)
Called patient and she is feeling better, asked her it she took her hydroxyzine and she said no, she does not like the side effects that medication gives to her. I will send her MRI to another place and she reported that she will look for one also and call if she fines one.

## 2023-12-01 ENCOUNTER — Ambulatory Visit (HOSPITAL_BASED_OUTPATIENT_CLINIC_OR_DEPARTMENT_OTHER): Payer: No Typology Code available for payment source | Admitting: Physical Therapy

## 2023-12-01 ENCOUNTER — Encounter (HOSPITAL_BASED_OUTPATIENT_CLINIC_OR_DEPARTMENT_OTHER): Payer: Self-pay | Admitting: Physical Therapy

## 2023-12-01 DIAGNOSIS — M62838 Other muscle spasm: Secondary | ICD-10-CM

## 2023-12-01 DIAGNOSIS — M5459 Other low back pain: Secondary | ICD-10-CM

## 2023-12-01 DIAGNOSIS — M542 Cervicalgia: Secondary | ICD-10-CM | POA: Diagnosis not present

## 2023-12-01 NOTE — Patient Instructions (Signed)
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
## 2023-12-03 ENCOUNTER — Telehealth: Payer: Self-pay

## 2023-12-03 ENCOUNTER — Ambulatory Visit (HOSPITAL_BASED_OUTPATIENT_CLINIC_OR_DEPARTMENT_OTHER): Payer: No Typology Code available for payment source | Admitting: Physical Therapy

## 2023-12-03 ENCOUNTER — Other Ambulatory Visit: Payer: Self-pay

## 2023-12-03 ENCOUNTER — Emergency Department (HOSPITAL_COMMUNITY)
Admission: EM | Admit: 2023-12-03 | Discharge: 2023-12-03 | Disposition: A | Discharge status: Home / Self Care | Payer: No Typology Code available for payment source | Attending: Emergency Medicine | Admitting: Emergency Medicine

## 2023-12-03 ENCOUNTER — Emergency Department (HOSPITAL_COMMUNITY): Payer: No Typology Code available for payment source

## 2023-12-03 ENCOUNTER — Telehealth: Payer: Self-pay | Admitting: Neurology

## 2023-12-03 ENCOUNTER — Encounter (HOSPITAL_COMMUNITY): Payer: Self-pay | Admitting: Emergency Medicine

## 2023-12-03 DIAGNOSIS — R519 Headache, unspecified: Secondary | ICD-10-CM | POA: Diagnosis present

## 2023-12-03 DIAGNOSIS — M5459 Other low back pain: Secondary | ICD-10-CM

## 2023-12-03 DIAGNOSIS — G8929 Other chronic pain: Secondary | ICD-10-CM | POA: Diagnosis not present

## 2023-12-03 DIAGNOSIS — M62838 Other muscle spasm: Secondary | ICD-10-CM

## 2023-12-03 LAB — CBC
HCT: 36.9 % (ref 36.0–46.0)
Hemoglobin: 13.4 g/dL (ref 12.0–15.0)
MCH: 29.6 pg (ref 26.0–34.0)
MCHC: 36.3 g/dL — ABNORMAL HIGH (ref 30.0–36.0)
MCV: 81.5 fL (ref 80.0–100.0)
Platelets: 147 10*3/uL — ABNORMAL LOW (ref 150–400)
RBC: 4.53 MIL/uL (ref 3.87–5.11)
RDW: 12.7 % (ref 11.5–15.5)
WBC: 7.6 10*3/uL (ref 4.0–10.5)
nRBC: 0 % (ref 0.0–0.2)

## 2023-12-03 LAB — BASIC METABOLIC PANEL
Anion gap: 9 (ref 5–15)
BUN: 11 mg/dL (ref 6–20)
CO2: 24 mmol/L (ref 22–32)
Calcium: 9.2 mg/dL (ref 8.9–10.3)
Chloride: 106 mmol/L (ref 98–111)
Creatinine, Ser: 0.71 mg/dL (ref 0.44–1.00)
GFR, Estimated: >60 mL/min (ref >60)
Glucose, Bld: 108 mg/dL — ABNORMAL HIGH (ref 70–99)
Potassium: 4.1 mmol/L (ref 3.5–5.1)
Sodium: 139 mmol/L (ref 135–145)

## 2023-12-03 LAB — CBG MONITORING, ED: Glucose-Capillary: 107 mg/dL — ABNORMAL HIGH (ref 70–99)

## 2023-12-03 LAB — HCG, SERUM, QUALITATIVE: Preg, Serum: NEGATIVE

## 2023-12-03 MED ORDER — GADOBUTROL 1 MMOL/ML IV SOLN
6.0000 mL | Freq: Once | INTRAVENOUS | Status: AC | PRN
  Administered 2023-12-03: 6 mL via INTRAVENOUS

## 2023-12-03 MED ORDER — PROCHLORPERAZINE EDISYLATE 10 MG/2ML IJ SOLN
10.0000 mg | Freq: Once | INTRAMUSCULAR | Status: AC
  Administered 2023-12-03: 10 mg via INTRAVENOUS
  Filled 2023-12-03: qty 2

## 2023-12-03 MED ORDER — LORAZEPAM 1 MG PO TABS
1.0000 mg | ORAL_TABLET | Freq: Once | ORAL | Status: AC | PRN
  Administered 2023-12-03: 1 mg via ORAL
  Filled 2023-12-03: qty 1

## 2023-12-03 MED ORDER — DIPHENHYDRAMINE HCL 50 MG/ML IJ SOLN
12.5000 mg | Freq: Once | INTRAMUSCULAR | Status: AC
  Administered 2023-12-03: 12.5 mg via INTRAVENOUS
  Filled 2023-12-03: qty 1

## 2023-12-03 MED ORDER — LORAZEPAM 1 MG PO TABS: 1.0000 mg | ORAL_TABLET | Freq: Once | ORAL | Status: DC

## 2023-12-03 NOTE — Telephone Encounter (Signed)
At 1200 pm Pt came by office because I had called her this AM and was working on the MRI order. She was in her sons doctor office and she stopped by. She still needs understanding of where she wants to do the MRI. I told her that I have it sent to [facility] because  she said the [facility] would.   Cont.Marland Kitchen

## 2023-12-03 NOTE — ED Triage Notes (Addendum)
Patient c/o headache x4 months. Patient report worsening headache and dizziness x 1 week. Patient primary recommended MRI. Patient report nausea, denies vomitting. Patient a/ox4.

## 2023-12-03 NOTE — Telephone Encounter (Signed)
Pt called and LM after Friday about am MRI referral. Would like a call back

## 2023-12-03 NOTE — ED Provider Notes (Signed)
[place] [facility] AT [facility] Provider Note   CSN: [id number] Arrival date & time: 12/03/23  1536    History  Chief Complaint  Patient presents with   Headache    Belinda Mcintosh is a 43 y.o. female   Headache x 3 months with dizziness. Saw PCP. Seen by Neuro, rec MRI brain, C spine. Difficulty with scheduling came here due to worsening symptoms. Some nausea without vomiting. Pressure to posterior head into upper neck. Feels "off" when HA occurs. Ibuprofen at home however not helping. Difficulty with word finding , feels slow. No numbness, weakness. Feels anxious.  No vision changes, fever, neck stiffness, facial droop, slurred speech.  HPI     Home Medications Prior to Admission medications   Medication Sig Start Date End Date Taking? Authorizing Provider  metroNIDAZOLE (FLAGYL) 500 MG tablet Take 1 tablet (500 mg total) by mouth 2 (two) times daily. Patient not taking: Reported on 11/21/2023 11/21/23   Lo, Toma Aran, CNM  acetaminophen (TYLENOL) 500 MG tablet Take 2 tablets (1,000 mg total) by mouth every 8 (eight) hours as needed (pain, headache, or fever). Patient not taking: Reported on 11/16/2023 08/22/21   Worthy Rancher, MD  chlorhexidine (PERIDEX) 0.12 % solution Use as directed 15 mLs in the mouth or throat 2 (two) times daily. Patient not taking: Reported on 11/16/2023 10/20/23   Claiborne Rigg, NP  GEMTESA 75 MG TABS Take 1 tablet by mouth daily. Patient not taking: Reported on 11/16/2023    [provider]  hydrOXYzine (ATARAX) 25 MG tablet Take 25 mg by mouth 3 (three) times daily as needed. Patient not taking: Reported on 11/16/2023 06/03/23   [provider]  ibuprofen (ADVIL) 800 MG tablet Take 1 tablet (800 mg total) by mouth 3 (three) times daily. Patient not taking: Reported on 11/16/2023 08/27/23   Charlynne Pander, MD  ipratropium (ATROVENT) 0.03 % nasal spray SPRAY 1 SPRAY INTO BOTH NOSTRILS DAILY. Patient not taking:  No sig reported 05/18/22   Charlott Holler, MD  lidocaine (LIDODERM) 5 % Place 1 patch onto the skin daily. Remove & Discard patch within 12 hours or as directed by MD Patient not taking: Reported on 11/16/2023 09/24/23   Lorre Nick, MD  lidocaine (XYLOCAINE) 2 % solution Use as directed 15 mLs in the mouth or throat as needed for mouth pain. Patient not taking: Reported on 11/16/2023 05/18/22   Barrie Dunker B, PA-C  lidocaine (XYLOCAINE) 5 % ointment Apply 1 application topically as needed. Patient not taking: Reported on 05/17/2022 11/10/21   Milas Hock, MD  metaxalone (SKELAXIN) 800 MG tablet Take 1 tablet (800 mg total) by mouth 3 (three) times daily. Patient not taking: Reported on 11/16/2023 09/24/23   Lorre Nick, MD  ofloxacin (OCUFLOX) 0.3 % ophthalmic solution 1 drop every 4 (four) hours. Patient not taking: Reported on 08/20/2023 06/14/23   [provider]  pantoprazole (PROTONIX) 20 MG tablet Take 1 tablet (20 mg total) by mouth daily. Patient not taking: Reported on 10/04/2021 04/22/21   Reva Bores, MD  polyethylene glycol powder (GLYCOLAX/MIRALAX) 17 GM/SCOOP powder Take 17 g by mouth daily. One capful daily Patient not taking: Reported on 10/14/2021 10/04/21   Allayne Stack, DO  Prenatal Vit-Fe Fumarate-FA (PRENATAL MULTIVITAMIN) TABS tablet Take 1 tablet by mouth daily at 12 noon. Patient not taking: Reported on 11/08/2021    [provider]  traMADol (ULTRAM) 50 MG tablet Take 50 mg by mouth  4 (four) times daily as needed. Patient not taking: Reported on 08/20/2023 06/14/23   [provider]      Allergies    Ivp dye [iodinated contrast media], Shrimp [shellfish allergy], Gabapentin, Rocephin [ceftriaxone], and Tramadol    Review of Systems   Review of Systems  Constitutional: Negative.   HENT: Negative.    Respiratory: Negative.    Cardiovascular: Negative.   Gastrointestinal: Negative.   Genitourinary: Negative.   Musculoskeletal:  Negative.   Skin: Negative.   Neurological:  Positive for dizziness, light-headedness and headaches. Negative for tremors, seizures, syncope, facial asymmetry, speech difficulty, weakness and numbness.  All other systems reviewed and are negative.   Physical Exam Updated Vital Signs BP 124/78 (BP Location: Right Arm)   Pulse 82   Temp 98.4 F (36.9 C) (Oral)   Resp 18   Ht 5\' 1"  (1.549 m)   Wt 64 kg   LMP 11/17/2023 (Approximate)   SpO2 100%   BMI 26.66 kg/m  Physical Exam Physical Exam  Constitutional: Pt is oriented to person, place, and time. Pt appears well-developed and well-nourished. No distress.  HENT:  Head: Normocephalic and atraumatic.  Mouth/Throat: Oropharynx is clear and moist.  Eyes: Conjunctivae and EOM are normal. Pupils are equal, round, and reactive to light. No scleral icterus.  No horizontal, vertical or rotational nystagmus  Neck: Normal range of motion. Neck supple.  Full active and passive ROM without pain No midline or paraspinal tenderness No nuchal rigidity or meningeal signs  Cardiovascular: Normal rate, regular rhythm and intact distal pulses.   Pulmonary/Chest: Effort normal and breath sounds normal. No respiratory distress. Pt has no wheezes. No rales.  Abdominal: Soft. Bowel sounds are normal. There is no tenderness. There is no rebound and no guarding.  Musculoskeletal: Normal range of motion.  Lymphadenopathy:    No cervical adenopathy.  Neurological: Pt. is alert and oriented to person, place, and time. He has normal reflexes. No cranial nerve deficit.  Exhibits normal muscle tone. Coordination normal.  Mental Status:  Alert, oriented, thought content appropriate. Speech fluent without evidence of aphasia. Able to follow 2 step commands without difficulty.  Cranial Nerves:  II:  Peripheral visual fields grossly normal, pupils equal, round, reactive to light III,IV, VI: ptosis not present, extra-ocular motions intact bilaterally  V,VII:  smile symmetric, facial light touch sensation equal VIII: hearing grossly normal bilaterally  IX,X: midline uvula rise  XI: bilateral shoulder shrug equal and strong XII: midline tongue extension  Motor:  5/5 in upper and lower extremities bilaterally including strong and equal grip strength and dorsiflexion/plantar flexion Sensory: Pinprick and light touch normal in all extremities.  Deep Tendon Reflexes: 2+ and symmetric  Cerebellar: normal finger-to-nose with bilateral upper extremities Gait: normal gait and balance CV: distal pulses palpable throughout   Skin: Skin is warm and dry. No rash noted. Pt is not diaphoretic.  Psychiatric: Pt has a normal mood and affect. Behavior is normal. Judgment and thought content normal.  Nursing note and vitals reviewed.  ED Results / Procedures / Treatments   Labs (all labs ordered are listed, but only abnormal results are displayed) Labs Reviewed  BASIC METABOLIC PANEL - Abnormal; Notable for the following components:      Result Value   Glucose, Bld 108 (*)    All other components within normal limits  CBC - Abnormal; Notable for the following components:   MCHC 36.3 (*)    Platelets 147 (*)    All other components within  normal limits  CBG MONITORING, ED - Abnormal; Notable for the following components:   Glucose-Capillary 107 (*)    All other components within normal limits  HCG, SERUM, QUALITATIVE  URINALYSIS, ROUTINE W REFLEX MICROSCOPIC  CBG MONITORING, ED    EKG None  Radiology MR Cervical Spine W and Wo Contrast Result Date: 12/03/2023 CLINICAL DATA:  Initial evaluation for chronic headache, neck pain. EXAM: MRI CERVICAL SPINE WITHOUT AND WITH CONTRAST TECHNIQUE: Multiplanar and multiecho pulse sequences of the cervical spine, to include the craniocervical junction and cervicothoracic junction, were obtained without and with intravenous contrast. CONTRAST:  6mL GADAVIST GADOBUTROL 1 MMOL/ML IV SOLN COMPARISON:  None Available.  FINDINGS: Alignment: Straightening of the normal cervical lordosis. No listhesis. Vertebrae: Vertebral body height maintained without acute or chronic fracture. Bone marrow signal intensity within normal limits. No discrete or worrisome osseous lesions. No abnormal marrow edema or enhancement. Cord: Normal signal and morphology.  No abnormal enhancement. Posterior Fossa, vertebral arteries, paraspinal tissues: Unremarkable. Disc levels: C2-C3: Unremarkable. C3-C4:  Unremarkable. C4-C5: Small right paracentral disc protrusion indents the right ventral thecal sac (series 9, image 21). No significant spinal stenosis. Foramina remain patent. C5-C6: Mild disc bulge with left-sided uncovertebral spurring. No significant spinal stenosis. Mild left C6 foraminal narrowing. Right neural foramen remains patent. C6-C7: Small central disc protrusion indents the ventral thecal sac (series 9, image 30). No significant spinal stenosis. Left-sided uncovertebral spurring without significant foraminal encroachment. C7-T1:  Unremarkable. IMPRESSION: 1. Normal MRI appearance of the cervical spinal cord. No abnormal enhancement. 2. Small right paracentral disc protrusion at C4-5 without significant stenosis. 3. Mild disc bulge with uncovertebral spurring at C5-6 with resultant mild left C6 foraminal stenosis. 4. Small central disc protrusion at C6-7 without significant stenosis or impingement. Electronically Signed   By: Rise Mu M.D.   On: 12/03/2023 23:15   MR Brain W and Wo Contrast Result Date: 12/03/2023 CLINICAL DATA:  Initial evaluation for acute headache, dizziness. EXAM: MRI HEAD WITHOUT AND WITH CONTRAST TECHNIQUE: Multiplanar, multiecho pulse sequences of the brain and surrounding structures were obtained without and with intravenous contrast. CONTRAST:  6mL GADAVIST GADOBUTROL 1 MMOL/ML IV SOLN COMPARISON:  Prior MRI from 11/03/2023 FINDINGS: Brain: Cerebral volume within normal limits. Patchy and confluent  T2/FLAIR hyperintensity involving the periventricular, deep, and subcortical white matter both cerebral hemispheres. Scattered corresponding T1 black holes noted. No significant infratentorial involvement. Overall, findings are nonspecific, but advanced for age, and overall moderate in nature. No abnormal foci of restricted diffusion to suggest acute or subacute ischemia. Gray-white matter differentiation maintained. No areas of chronic cortical infarction or other insult. No acute or chronic intracranial blood products. No mass lesion, midline shift or mass effect. No hydrocephalus or extra-axial fluid collection. Pituitary gland and suprasellar region within normal limits. No abnormal enhancement. Vascular: Major intracranial vascular flow voids are maintained. Skull and upper cervical spine: Craniocervical junction within normal limits. Bone marrow signal intensity normal. No scalp soft tissue abnormality. Sinuses/Orbits: Globes and orbital soft tissues within normal limits. Paranasal sinuses are largely clear. No significant mastoid effusion. Other: None. IMPRESSION: 1. No acute intracranial abnormality. 2. Moderate cerebral white matter disease, nonspecific, but advanced for age. Differential considerations are broad, and include sequelae of accelerated/hereditary chronic small-vessel ischemia, changes related to prior trauma, hypercoagulable state, vasculitis, sequelae of complicated migraines, prior infectious or inflammatory process, or demyelination. No abnormal enhancement. Electronically Signed   By: Rise Mu M.D.   On: 12/03/2023 23:10    Procedures Procedures  Medications Ordered in ED Medications  LORazepam (ATIVAN) tablet 1 mg (1 mg Oral Given 12/03/23 1901)  prochlorperazine (COMPAZINE) injection 10 mg (10 mg Intravenous Given 12/03/23 2109)  diphenhydrAMINE (BENADRYL) injection 12.5 mg (12.5 mg Intravenous Given 12/03/23 2108)  gadobutrol (GADAVIST) 1 MMOL/ML injection 6 mL (6  mLs Intravenous Contrast Given 12/03/23 2205)   ED Course/ Medical Decision Making/ A&P   43 year old here for evaluation of headache over the last 4 months.  Intermittent dizziness and pressure to the posterior aspect of her head into her neck.  No fever, numbness, weakness.  Feels off when symptoms occur.  She is followed by neurology in the outpatient setting delaying MRI due to insurance subsequently sent here for expedited imaging.  Nonfocal neuroexam without deficits.  She does appear anxious.  Plan on imaging that neurology was going to get in the outpatient setting.  No history of MS, CVA, HTN.  Labs and imaging personally viewed and interpreted:  CBC without leukocytosis  Metabolic panel without significant abnormality Pregnancy test negative  Give migraine cocktail to see if this helps with her symptoms.  MRI brain that acute abnormality does show chronic changes MR cervical spine without enhancement  Discussed results with patient, family in room.  Will have her follow-up with her neurologist.  Her headache improved with migraine cocktail here in ED.  She continues have a nonfocal neuroexam without deficits.  The patient has been appropriately medically screened and/or stabilized in the ED. I have low suspicion for any other emergent medical condition which would require further screening, evaluation or treatment in the ED or require inpatient management.  Patient is hemodynamically stable and in no acute distress.  Patient able to ambulate in department prior to ED.  Evaluation does not show acute pathology that would require ongoing or additional emergent interventions while in the emergency department or further inpatient treatment.  I have discussed the diagnosis with the patient and answered all questions.  Pain is been managed while in the emergency department and patient has no further complaints prior to discharge.  Patient is comfortable with plan discussed in room and is  stable for discharge at this time.  I have discussed strict return precautions for returning to the emergency department.  Patient was encouraged to follow-up with PCP/specialist refer to at discharge.                                  Medical Decision Making Amount and/or Complexity of Data Reviewed Independent Historian: friend External Data Reviewed: labs, radiology and notes. Labs: ordered. Decision-making details documented in ED Course. Radiology: ordered and independent interpretation performed. Decision-making details documented in ED Course.  Risk OTC drugs. Prescription drug management. Parenteral controlled substances. Decision regarding hospitalization. Diagnosis or treatment significantly limited by social determinants of health.          Final Clinical Impression(s) / ED Diagnoses Final diagnoses:  Chronic nonintractable headache, unspecified headache type    Rx / DC Orders ED Discharge Orders     None         Dayna Alia A, PA-C 12/03/23 2329    Wynetta Fines, MD 12/03/23 2344

## 2023-12-03 NOTE — Discharge Instructions (Addendum)
Make sure to follow-up with your neurologist  Return for new or worsening symptoms

## 2023-12-03 NOTE — ED Notes (Signed)
Patient transported to MRI 

## 2023-12-04 ENCOUNTER — Encounter (HOSPITAL_BASED_OUTPATIENT_CLINIC_OR_DEPARTMENT_OTHER): Payer: Self-pay | Admitting: Physical Therapy

## 2023-12-04 ENCOUNTER — Telehealth: Payer: Self-pay

## 2023-12-04 NOTE — Telephone Encounter (Signed)
Not take her insurance. I told her that sometimes insurance would not give an approval for MRI unless it is necessary for hospitals. I sent the MRI to [facility] which we had talked about after [facility] would not take insurance. She wanted it quickly. I told her the insurance would be fast if it doesn't need a PA. ( I called after this visit and they were closed for the holiday. Checked on line and it will need a PA) , Either way depending on scheduling we can't go any faster than that allows. She just continue to want it fast.

## 2023-12-05 ENCOUNTER — Other Ambulatory Visit: Payer: Self-pay

## 2023-12-05 ENCOUNTER — Ambulatory Visit: Payer: Commercial Managed Care - HMO | Admitting: Neurology

## 2023-12-05 ENCOUNTER — Telehealth: Payer: Self-pay

## 2023-12-05 DIAGNOSIS — R9089 Other abnormal findings on diagnostic imaging of central nervous system: Secondary | ICD-10-CM

## 2023-12-05 DIAGNOSIS — R42 Dizziness and giddiness: Secondary | ICD-10-CM

## 2023-12-05 NOTE — Telephone Encounter (Signed)
Patient is aware of scheduled appointment with New Patient appointment, patient is aware of our location, times/dates of the appointment. Patient is also aware to arrive at least 20 mins prior to appointment for registration

## 2023-12-08 ENCOUNTER — Encounter (HOSPITAL_BASED_OUTPATIENT_CLINIC_OR_DEPARTMENT_OTHER): Payer: Self-pay | Admitting: Physical Therapy

## 2023-12-08 ENCOUNTER — Ambulatory Visit (HOSPITAL_BASED_OUTPATIENT_CLINIC_OR_DEPARTMENT_OTHER): Payer: No Typology Code available for payment source | Admitting: Physical Therapy

## 2023-12-08 DIAGNOSIS — M542 Cervicalgia: Secondary | ICD-10-CM | POA: Diagnosis not present

## 2023-12-08 DIAGNOSIS — M5459 Other low back pain: Secondary | ICD-10-CM

## 2023-12-08 DIAGNOSIS — M62838 Other muscle spasm: Secondary | ICD-10-CM
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
## 2023-12-13 ENCOUNTER — Ambulatory Visit (HOSPITAL_BASED_OUTPATIENT_CLINIC_OR_DEPARTMENT_OTHER): Payer: No Typology Code available for payment source | Admitting: Physical Therapy

## 2023-12-13 DIAGNOSIS — M62838 Other muscle spasm: Secondary | ICD-10-CM

## 2023-12-13 DIAGNOSIS — M5459 Other low back pain: Secondary | ICD-10-CM

## 2023-12-13 DIAGNOSIS — M542 Cervicalgia: Secondary | ICD-10-CM | POA: Diagnosis not present
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
## 2023-12-15 ENCOUNTER — Ambulatory Visit (HOSPITAL_BASED_OUTPATIENT_CLINIC_OR_DEPARTMENT_OTHER): Payer: No Typology Code available for payment source | Admitting: Physical Therapy

## 2023-12-17 ENCOUNTER — Ambulatory Visit (HOSPITAL_BASED_OUTPATIENT_CLINIC_OR_DEPARTMENT_OTHER): Payer: No Typology Code available for payment source

## 2023-12-18 ENCOUNTER — Inpatient Hospital Stay (HOSPITAL_COMMUNITY): Admission: RE | Admit: 2023-12-18 | Payer: No Typology Code available for payment source | Source: Ambulatory Visit

## 2023-12-18 DIAGNOSIS — J3489 Other specified disorders of nose and nasal sinuses: Secondary | ICD-10-CM | POA: Insufficient documentation

## 2023-12-18 DIAGNOSIS — R42 Dizziness and giddiness: Secondary | ICD-10-CM | POA: Insufficient documentation

## 2023-12-18 HISTORY — DX: Other specified disorders of nose and nasal sinuses: J34.89

## 2023-12-18 NOTE — Progress Notes (Signed)
 [facility] CONSULT NOTE  Patient Care Team: Long, Glendia, PA-C as PCP - General (Physician Assistant)  ASSESSMENT & PLAN 43 y.o.female with history of appendectomy, kidney stone referred to Hematology for thrombocytopenia.   History showed mild chronic thrombocytopenia.  This has not changed for years, no clinical concerns. Recent labs showed normal B12.  Of note she reports leg cramping, previous electrolytes were normal.  Unclear to why she has some cramping, will check iron storage today.  Low iron stores Recommend to start ferrous sulfate 325 mg OTC. with Vitamin C tablet.  Not to take it with calcium  containing products like milk or supplements Follow-up in 6 months with repeat CBC and ferritin.    Thrombocytopenia (HCC) Chronic, unchanged.  Asymptomatic.  Orders Placed This Encounter  Procedures   Folate    Standing Status:   Future    Number of Occurrences:   1    Expiration Date:   12/19/2024   Hepatitis C antibody    Standing Status:   Future    Number of Occurrences:   1    Expiration Date:   12/19/2024   HIV antibody (with reflex)    Standing Status:   Future    Number of Occurrences:   1    Expiration Date:   12/19/2024   Ferritin    Standing Status:   Future    Number of Occurrences:   1    Expiration Date:   12/19/2024   [name], MD 12/21/2023 10:59 AM  CHIEF COMPLAINTS/PURPOSE OF CONSULTATION:  Low platelet  HISTORY OF PRESENTING ILLNESS:  Belinda Mcintosh 43 y.o. female is here because of thrombocytopenia.  Report she knows her low platelets since her 35's while in [place]. She denies any need for treatment like prednisone.  She denies any new medication recently.  She denies any recent infections or previous significant infections.  She recently had new PCP and referred here for low platelet.  Overall she feels well.  Reports some dizziness which is her main concern.  She is waiting to follow-up with urology after the MRI.  MEDICAL  HISTORY:  Past Medical History:  Diagnosis Date   Abdominal pain    Flank pain    History of kidney stones    Low back pain    Palpitations    Umbilical hernia 2018    SURGICAL HISTORY: Past Surgical History:  Procedure Laterality Date   COSMETIC SURGERY     INDUCED ABORTION     LAPAROSCOPIC APPENDECTOMY N/A 08/21/2023   Procedure: APPENDECTOMY LAPAROSCOPIC;  Surgeon: Eletha Boas, MD;  Location: [facility];  Service: General;  Laterality: N/A;   LIPOSUCTION     NOSE SURGERY      SOCIAL HISTORY: Social History   Socioeconomic History   Marital status: Divorced    Spouse name: Not on file   Number of children: 1   Years of education: Not on file   Highest education level: Not on file  Occupational History   Not on file  Tobacco Use   Smoking status: Former    Current packs/day: 0.00    Types: Cigarettes    Quit date: 09/21/2016    Years since quitting: 7.2    Passive exposure: Never   Smokeless tobacco: Never  Vaping Use   Vaping status: Never Used  Substance and Sexual Activity   Alcohol use: No   Drug use: No   Sexual activity: Yes    Birth control/protection: Condom  Other  Topics Concern   Not on file  Social History Narrative   ** Merged History Encounter **    Are you right handed or left handed? right   Are you currently employed ?    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   Who lives with you? Kids and sister   What type of home do you live in: 1 story or 2 story? two   Caffeine none    Social Drivers of Corporate Investment Banker Strain: Not on file  Food Insecurity: No Food Insecurity (08/21/2023)   Hunger Vital Sign    Worried About Running Out of Food in the Last Year: Never true    Ran Out of Food in the Last Year: Never true  Transportation Needs: No Transportation Needs (08/21/2023)   PRAPARE - Administrator, Civil Service (Medical): No    Lack of Transportation (Non-Medical): No  Physical Activity:  Not on file  Stress: Not on file  Social Connections: Unknown (07/27/2023)   Received [facility]   Social Network    Social Network: Not on file  Intimate Partner Violence: Not At Risk (08/21/2023)   Humiliation, Afraid, Rape, and Kick questionnaire    Fear of Current or Ex-Partner: No    Emotionally Abused: No    Physically Abused: No    Sexually Abused: No    FAMILY HISTORY: Family History  Problem Relation Age of Onset   Hypertension Mother    Diabetes Mother    Breast cancer Mother        Breast Cancer   Cancer Father    Hypertension Father    Diabetes Father    Cancer Sister    Fibromyalgia Sister    Other Sister        spinal stenosis   Alcohol abuse Neg Hx    Arthritis Neg Hx    Asthma Neg Hx    COPD Neg Hx    Depression Neg Hx    Drug abuse Neg Hx    Early death Neg Hx    Heart disease Neg Hx    Hearing loss Neg Hx    Hyperlipidemia Neg Hx    Kidney disease Neg Hx    Learning disabilities Neg Hx    Mental illness Neg Hx    Mental retardation Neg Hx    Miscarriages / Stillbirths Neg Hx    Stroke Neg Hx    Vision loss Neg Hx    Varicose Veins Neg Hx     ALLERGIES:  is allergic to ivp dye [iodinated contrast media], shrimp [shellfish allergy], gabapentin, rocephin  [ceftriaxone ], and tramadol .  MEDICATIONS:  No current outpatient medications on file.   Current Facility-Administered Medications  Medication Dose Route Frequency Provider Last Rate Last Admin   lidocaine  (XYLOCAINE ) 5 % ointment   Topical TID PRN Cleatus Moccasin, MD        REVIEW OF SYSTEMS:   All relevant systems were reviewed with the patient and are negative.  PHYSICAL EXAMINATION:  Vitals:   12/20/23 1459  BP: 103/66  Pulse: 72  Resp: 17  Temp: 97.9 F (36.6 C)  SpO2: 100%   Filed Weights   12/20/23 1459  Weight: 134 lb 9.6 oz (61.1 kg)    GENERAL: alert, no distress and comfortable SKIN: skin color normal and no bruising or petechiae on exposed skin EYES: normal,  sclera clear LUNGS: clear to auscultation and percussion with normal breathing  effort HEART: regular rate & rhythm and no murmurs  ABDOMEN: abdomen soft, non-tender and nondistended. Musculoskeletal: no edema  LABORATORY DATA:  I have reviewed the data as listed  RADIOGRAPHIC STUDIES: I have personally reviewed the radiological images as listed and agreed with the findings in the report. MR Cervical Spine W and Wo Contrast Result Date: 12/03/2023 CLINICAL DATA:  Initial evaluation for chronic headache, neck pain. EXAM: MRI CERVICAL SPINE WITHOUT AND WITH CONTRAST TECHNIQUE: Multiplanar and multiecho pulse sequences of the cervical spine, to include the craniocervical junction and cervicothoracic junction, were obtained without and with intravenous contrast. CONTRAST:  6mL GADAVIST  GADOBUTROL  1 MMOL/ML IV SOLN COMPARISON:  None Available. FINDINGS: Alignment: Straightening of the normal cervical lordosis. No listhesis. Vertebrae: Vertebral body height maintained without acute or chronic fracture. Bone marrow signal intensity within normal limits. No discrete or worrisome osseous lesions. No abnormal marrow edema or enhancement. Cord: Normal signal and morphology.  No abnormal enhancement. Posterior Fossa, vertebral arteries, paraspinal tissues: Unremarkable. Disc levels: C2-C3: Unremarkable. C3-C4:  Unremarkable. C4-C5: Small right paracentral disc protrusion indents the right ventral thecal sac (series 9, image 21). No significant spinal stenosis. Foramina remain patent. C5-C6: Mild disc bulge with left-sided uncovertebral spurring. No significant spinal stenosis. Mild left C6 foraminal narrowing. Right neural foramen remains patent. C6-C7: Small central disc protrusion indents the ventral thecal sac (series 9, image 30). No significant spinal stenosis. Left-sided uncovertebral spurring without significant foraminal encroachment. C7-T1:  Unremarkable. IMPRESSION: 1. Normal MRI appearance of the cervical  spinal cord. No abnormal enhancement. 2. Small right paracentral disc protrusion at C4-5 without significant stenosis. 3. Mild disc bulge with uncovertebral spurring at C5-6 with resultant mild left C6 foraminal stenosis. 4. Small central disc protrusion at C6-7 without significant stenosis or impingement. Electronically Signed   By: Morene Hoard M.D.   On: 12/03/2023 23:15   MR Brain W and Wo Contrast Result Date: 12/03/2023 CLINICAL DATA:  Initial evaluation for acute headache, dizziness. EXAM: MRI HEAD WITHOUT AND WITH CONTRAST TECHNIQUE: Multiplanar, multiecho pulse sequences of the brain and surrounding structures were obtained without and with intravenous contrast. CONTRAST:  6mL GADAVIST  GADOBUTROL  1 MMOL/ML IV SOLN COMPARISON:  Prior MRI from 11/03/2023 FINDINGS: Brain: Cerebral volume within normal limits. Patchy and confluent T2/FLAIR hyperintensity involving the periventricular, deep, and subcortical white matter both cerebral hemispheres. Scattered corresponding T1 black holes noted. No significant infratentorial involvement. Overall, findings are nonspecific, but advanced for age, and overall moderate in nature. No abnormal foci of restricted diffusion to suggest acute or subacute ischemia. Gray-white matter differentiation maintained. No areas of chronic cortical infarction or other insult. No acute or chronic intracranial blood products. No mass lesion, midline shift or mass effect. No hydrocephalus or extra-axial fluid collection. Pituitary gland and suprasellar region within normal limits. No abnormal enhancement. Vascular: Major intracranial vascular flow voids are maintained. Skull and upper cervical spine: Craniocervical junction within normal limits. Bone marrow signal intensity normal. No scalp soft tissue abnormality. Sinuses/Orbits: Globes and orbital soft tissues within normal limits. Paranasal sinuses are largely clear. No significant mastoid effusion. Other: None. IMPRESSION: 1.  No acute intracranial abnormality. 2. Moderate cerebral white matter disease, nonspecific, but advanced for age. Differential considerations are broad, and include sequelae of accelerated/hereditary chronic small-vessel ischemia, changes related to prior trauma, hypercoagulable state, vasculitis, sequelae of complicated migraines, prior infectious or inflammatory process, or demyelination. No abnormal enhancement. Electronically Signed   By: Morene Hoard M.D.   On: 12/03/2023 23:10

## 2023-12-19 ENCOUNTER — Ambulatory Visit (HOSPITAL_BASED_OUTPATIENT_CLINIC_OR_DEPARTMENT_OTHER): Payer: No Typology Code available for payment source | Attending: Neurology | Admitting: Physical Therapy

## 2023-12-19 DIAGNOSIS — M62838 Other muscle spasm: Secondary | ICD-10-CM | POA: Insufficient documentation

## 2023-12-19 DIAGNOSIS — M5459 Other low back pain: Secondary | ICD-10-CM | POA: Insufficient documentation
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
## 2023-12-20 ENCOUNTER — Inpatient Hospital Stay: Payer: No Typology Code available for payment source

## 2023-12-20 ENCOUNTER — Ambulatory Visit (HOSPITAL_COMMUNITY): Admission: RE | Admit: 2023-12-20 | Payer: No Typology Code available for payment source | Source: Ambulatory Visit

## 2023-12-20 ENCOUNTER — Encounter (HOSPITAL_BASED_OUTPATIENT_CLINIC_OR_DEPARTMENT_OTHER): Payer: Self-pay | Admitting: Physical Therapy

## 2023-12-20 VITALS — BP 103/66 | HR 72 | Temp 97.9°F | Resp 17 | Ht 61.0 in | Wt 134.6 lb

## 2023-12-20 DIAGNOSIS — Z833 Family history of diabetes mellitus: Secondary | ICD-10-CM | POA: Diagnosis not present

## 2023-12-20 DIAGNOSIS — D696 Thrombocytopenia, unspecified: Secondary | ICD-10-CM

## 2023-12-20 DIAGNOSIS — Z809 Family history of malignant neoplasm, unspecified: Secondary | ICD-10-CM | POA: Insufficient documentation

## 2023-12-20 DIAGNOSIS — M4802 Spinal stenosis, cervical region: Secondary | ICD-10-CM | POA: Diagnosis not present

## 2023-12-20 DIAGNOSIS — R252 Cramp and spasm: Secondary | ICD-10-CM | POA: Insufficient documentation

## 2023-12-20 DIAGNOSIS — Z8269 Family history of other diseases of the musculoskeletal system and connective tissue: Secondary | ICD-10-CM | POA: Diagnosis not present

## 2023-12-20 DIAGNOSIS — M50221 Other cervical disc displacement at C4-C5 level: Secondary | ICD-10-CM | POA: Diagnosis not present

## 2023-12-20 DIAGNOSIS — Z888 Allergy status to other drugs, medicaments and biological substances status: Secondary | ICD-10-CM | POA: Insufficient documentation

## 2023-12-20 DIAGNOSIS — Z885 Allergy status to narcotic agent status: Secondary | ICD-10-CM | POA: Diagnosis not present

## 2023-12-20 DIAGNOSIS — Z79899 Other long term (current) drug therapy: Secondary | ICD-10-CM | POA: Diagnosis not present

## 2023-12-20 DIAGNOSIS — Z87442 Personal history of urinary calculi: Secondary | ICD-10-CM | POA: Diagnosis present

## 2023-12-20 DIAGNOSIS — Z803 Family history of malignant neoplasm of breast: Secondary | ICD-10-CM | POA: Insufficient documentation

## 2023-12-20 DIAGNOSIS — R42 Dizziness and giddiness: Secondary | ICD-10-CM | POA: Diagnosis not present

## 2023-12-20 DIAGNOSIS — Z881 Allergy status to other antibiotic agents status: Secondary | ICD-10-CM | POA: Insufficient documentation

## 2023-12-20 DIAGNOSIS — R790 Abnormal level of blood mineral: Secondary | ICD-10-CM | POA: Diagnosis not present

## 2023-12-20 DIAGNOSIS — M50322 Other cervical disc degeneration at C5-C6 level: Secondary | ICD-10-CM | POA: Diagnosis not present

## 2023-12-20 DIAGNOSIS — Z87891 Personal history of nicotine dependence: Secondary | ICD-10-CM | POA: Diagnosis not present

## 2023-12-20 DIAGNOSIS — Z8249 Family history of ischemic heart disease and other diseases of the circulatory system: Secondary | ICD-10-CM | POA: Diagnosis not present

## 2023-12-20 LAB — HEPATITIS C ANTIBODY: HCV Ab: NONREACTIVE

## 2023-12-20 LAB — FOLATE: Folate: 8.8 ng/mL (ref >5.9)

## 2023-12-21 ENCOUNTER — Telehealth: Payer: Self-pay | Admitting: *Deleted

## 2023-12-21 ENCOUNTER — Telehealth: Payer: Self-pay

## 2023-12-21 DIAGNOSIS — R790 Abnormal level of blood mineral: Secondary | ICD-10-CM | POA: Insufficient documentation

## 2023-12-21 LAB — FERRITIN: Ferritin: 30 ng/mL (ref 11–307)

## 2023-12-21 LAB — HIV ANTIBODY (ROUTINE TESTING W REFLEX): HIV Screen 4th Generation wRfx: NONREACTIVE

## 2023-12-21 NOTE — Assessment & Plan Note (Signed)
 Recommend to start ferrous sulfate 325 mg OTC. with Vitamin C tablet.  Not to take it with calcium  containing products like milk or supplements Follow-up in 6 months with repeat CBC and ferritin.

## 2023-12-21 NOTE — Assessment & Plan Note (Signed)
 Chronic, unchanged.  Asymptomatic.

## 2023-12-21 NOTE — Telephone Encounter (Signed)
 Notified of message below via interpreter

## 2023-12-21 NOTE — Telephone Encounter (Signed)
 Belinda Mcintosh

## 2023-12-24 ENCOUNTER — Ambulatory Visit (HOSPITAL_COMMUNITY)
Admission: RE | Admit: 2023-12-24 | Discharge: 2023-12-24 | Disposition: A | Discharge status: Home / Self Care | Payer: No Typology Code available for payment source | Source: Ambulatory Visit | Attending: Neurology | Admitting: Neurology

## 2023-12-24 ENCOUNTER — Ambulatory Visit (HOSPITAL_COMMUNITY): Payer: No Typology Code available for payment source

## 2023-12-24 DIAGNOSIS — R9089 Other abnormal findings on diagnostic imaging of central nervous system: Secondary | ICD-10-CM | POA: Diagnosis present

## 2023-12-24 DIAGNOSIS — R42 Dizziness and giddiness: Secondary | ICD-10-CM | POA: Diagnosis present

## 2023-12-24 LAB — CSF CELL COUNT WITH DIFFERENTIAL
RBC Count, CSF: 8 /mm3 — ABNORMAL HIGH
Tube #: 3
WBC, CSF: 2 /mm3 (ref 0–5)

## 2023-12-24 LAB — PROTEIN AND GLUCOSE, CSF
Glucose, CSF: 54 mg/dL (ref 40–70)
Total  Protein, CSF: 32 mg/dL (ref 15–45)

## 2023-12-24 MED ORDER — LIDOCAINE HCL (PF) 1 % IJ SOLN
10.0000 mL | Freq: Once | INTRAMUSCULAR | Status: AC
  Administered 2023-12-24: 10 mL

## 2023-12-24 NOTE — Progress Notes (Signed)
 PROCEDURE SUMMARY:  Successful fluoroscopic guided lumbar puncture. No immediate complications.  Pt tolerated well.   EBL = none  Please see full dictation in imaging section of Epic for procedure details.

## 2023-12-25 NOTE — Telephone Encounter (Signed)
Belinda Mcintosh

## 2023-12-26 LAB — IGG CSF INDEX
Albumin CSF-mCnc: 21 mg/dL (ref 8–37)
Albumin: 4.2 g/dL (ref 3.9–4.9)
CSF IgG Index: 0.6 (ref 0.0–0.7)
IgG (Immunoglobin G), Serum: 1190 mg/dL (ref 586–1602)
IgG, CSF: 3.4 mg/dL (ref 0.0–6.7)
IgG/Alb Ratio, CSF: 0.16 (ref 0.00–0.25)

## 2023-12-26 LAB — CYTOLOGY - NON PAP

## 2023-12-27 ENCOUNTER — Other Ambulatory Visit: Payer: Self-pay

## 2023-12-27 ENCOUNTER — Telehealth (HOSPITAL_BASED_OUTPATIENT_CLINIC_OR_DEPARTMENT_OTHER): Payer: Self-pay | Admitting: *Deleted

## 2023-12-27 ENCOUNTER — Telehealth: Payer: Self-pay | Admitting: Neurology

## 2023-12-27 ENCOUNTER — Emergency Department (HOSPITAL_BASED_OUTPATIENT_CLINIC_OR_DEPARTMENT_OTHER)
Admission: EM | Admit: 2023-12-27 | Discharge: 2023-12-27 | Disposition: A | Discharge status: Home / Self Care | Payer: No Typology Code available for payment source

## 2023-12-27 ENCOUNTER — Ambulatory Visit (HOSPITAL_BASED_OUTPATIENT_CLINIC_OR_DEPARTMENT_OTHER): Payer: No Typology Code available for payment source | Admitting: Family Medicine

## 2023-12-27 ENCOUNTER — Encounter (HOSPITAL_BASED_OUTPATIENT_CLINIC_OR_DEPARTMENT_OTHER): Payer: Self-pay | Admitting: Emergency Medicine

## 2023-12-27 DIAGNOSIS — G971 Other reaction to spinal and lumbar puncture: Secondary | ICD-10-CM | POA: Diagnosis not present

## 2023-12-27 DIAGNOSIS — R519 Headache, unspecified: Secondary | ICD-10-CM | POA: Diagnosis present

## 2023-12-27 LAB — CSF CULTURE W GRAM STAIN: Gram Stain: NONE SEEN

## 2023-12-27 NOTE — ED Notes (Signed)
Resp swab d/c per EDP

## 2023-12-27 NOTE — Telephone Encounter (Signed)
Pt said she needs a call back from someone. She called yesterday and has not heard back. She is having migraines.

## 2023-12-27 NOTE — Telephone Encounter (Signed)
Called pt and she reports  after having Lumbar puncture on 10 th she has been having headaches, cold hands and feet, and congestion. She has been laying down. She has canceled her trip to [place] where she was going

## 2023-12-27 NOTE — ED Triage Notes (Signed)
Pt via pov from home with headache since lumbar puncture on Monday. Pt states she was told she would have a headache for 24 hours, but the pain has continued and she called and was told she might need a blood patch and to come to ED. She also states she has nasal congestion and chills. Pt alert & oriented, nad noted.

## 2023-12-27 NOTE — Discharge Instructions (Signed)
You are seen in the emergency department today for concerns of a headache after lumbar puncture.  You should attempt to drink caffeine and water at home to try to resolve this headache.  If her headache is not resolving, please return to the emergency department for evaluation.  You should specifically go to [facility] or [facility] for evaluation as they will have a medication for your potentially able to perform the blood patch there.

## 2023-12-27 NOTE — ED Provider Notes (Signed)
[facility]PART[facility] AT [facility] Provider Note   CSN: [id number] Arrival date & time: 12/27/23  1328     History Chief Complaint  Patient presents with   Headache    Belinda Mcintosh is a 43 y.o. female.  Patient presents the emergency department concerns of a headache.  States that she had a lumbar puncture performed on Monday of this week.  Headache has not resolved since LP was performed.  Reach out to the neurologist office who advised patient to be seen in the ED for possible treatment of this headache.  She denies any recent fever, body aches, or sick contact.  No vision changes, nausea, vomiting.  Reports the pain is typically alleviated by laying down and worse when she tries to sit up or stand up.   Headache      Home Medications Prior to Admission medications   Medication Sig Start Date End Date Taking? Authorizing Provider  cetirizine (ZYRTEC) 10 MG tablet Take 1 tablet by mouth daily. 12/12/23 12/11/24 Yes [provider]      Allergies    Ivp dye [iodinated contrast media], Shrimp [shellfish allergy], Gabapentin, Rocephin [ceftriaxone], and Tramadol    Review of Systems   Review of Systems  Neurological:  Positive for headaches.  All other systems reviewed and are negative.   Physical Exam Updated Vital Signs BP 109/75   Pulse 71   Temp 98.6 F (37 C)   Resp 19   Ht 5\' 1"  (1.549 m)   Wt 61.1 kg   LMP 12/09/2023   SpO2 99%   BMI 25.45 kg/m  Physical Exam Vitals and nursing note reviewed.  Constitutional:      General: She is not in acute distress.    Appearance: She is well-developed.  HENT:     Head: Normocephalic and atraumatic.  Eyes:     Conjunctiva/sclera: Conjunctivae normal.  Cardiovascular:     Rate and Rhythm: Normal rate and regular rhythm.     Heart sounds: No murmur heard. Pulmonary:     Effort: Pulmonary effort is normal. No respiratory distress.     Breath sounds: Normal breath sounds.  Abdominal:      Palpations: Abdomen is soft.     Tenderness: There is no abdominal tenderness.  Musculoskeletal:        General: No swelling.     Cervical back: Neck supple.  Skin:    General: Skin is warm and dry.     Capillary Refill: Capillary refill takes less than 2 seconds.  Neurological:     Mental Status: She is alert.  Psychiatric:        Mood and Affect: Mood normal.     ED Results / Procedures / Treatments   Labs (all labs ordered are listed, but only abnormal results are displayed) Labs Reviewed  RESP PANEL BY RT-PCR (RSV, FLU A&B, COVID)  [id number]    EKG None  Radiology No results found.  Procedures Procedures    Medications Ordered in ED Medications - No data to display  ED Course/ Medical Decision Making/ A&P                                 Medical Decision Making  This patient presents to the ED for concern of headache. Differential diagnosis includes post lumbar puncture headache, viral URI, pneumonia, bronchitis, migraine headache   Problem List / ED Course:  Patient presents ED  3 days post lumbar puncture with a headache.  She states that this was an anticipated side effect that she was made aware about but states that she has not had any resolution in about 3 days since the LP was performed.  States that he reached out to the neurology office and was unable to get seen by their office.  She is now here for evaluation and further management of his headache.  She was advised that she may need a blood patch given persistent symptoms. On exam, patient is well-appearing while lying down.  Not meningitic concerning fevers, neck stiffness, or altered mental status.  There is also no photophobia, neurological deficits, seizures, or any rash present.  No recent fever patient does report some exposure to her daughter was sick with a suspected URI recently.  She does also endorse worsening pain with a headache when she tries to sit up or stand up.  No dizziness, nausea,  vomiting. Discussed management of patient's headache with a combination of IV fluids and IV caffeine.  Unfortunately here in the emergency department [facility], IV caffeine is not available.  Discussed potential management with a migraine cocktail.  Patient would prefer to hold off on any possible blood patch and given that we are unable to perform the IV medications for a post LP headache with the caffeine, patient would prefer not to try any treatment here.  She states that she received a call from her neurologist office who advised that she could try drinking caffeine via coffee.  She states that she was unaware that she could just take caffeine at home and for this reason we will prefer to try this instead of any specific medications or interventions here in the ER.  Given that patient is otherwise stable and lying down and symptoms are highly consistent with a post lumbar puncture headache, do not feel the patient requires emergent transfer to another facility for definitive management.  I did advise patient return to the emergency department if she is having no resolution in symptoms.  Patient otherwise stable at this time for discharge home with plans for attempted management of the headache at home with caffeine and fluids.  Advised patient to return to the emergency department if she has any significant changes in her symptoms.  Patient discharged home in stable condition.  Final Clinical Impression(s) / ED Diagnoses Final diagnoses:  Post lumbar puncture headache    Rx / DC Orders ED Discharge Orders     None         Smitty Knudsen, PA-C 12/27/23 1814    Laurence Spates, MD 12/28/23 1759

## 2023-12-27 NOTE — Telephone Encounter (Signed)
Patient put on schedule today for an acute visit by [facility] for lumbar puncture/ new patient.  Called pt advised we do not have any new patient slots this afternoon and this was made in error. Asked patient what she was needing to be seen for.   Patient stated she had lumbar puncture yesterday and today has severe headache and numbness in hands.   Advised patient she would need to go to ER she stated she would.

## 2023-12-27 NOTE — Telephone Encounter (Signed)
Ms. Belinda Mcintosh is s/p lumbar puncture on 2/10. She contacted the office with complaints of headaches upon standing, cold extremities while laying down, and one episode of tightness of chest yesterday evening. She requested a blood patch. I advised patient to be seen urgently in ED for symptoms of chest tightness and shortness of breath. For her headaches, patient was advised to remain laying flat for several days, and to contact Dr. Loleta Chance, her neurologist, or PCP, for re-evaluation. Should they determine a blood patch is required, they can place an order for one with IR service. Patient voiced understanding and is amenable to this plan.

## 2023-12-30 ENCOUNTER — Other Ambulatory Visit: Payer: Self-pay

## 2023-12-30 ENCOUNTER — Encounter (HOSPITAL_COMMUNITY): Payer: Self-pay

## 2023-12-30 ENCOUNTER — Emergency Department (HOSPITAL_COMMUNITY)
Admission: EM | Admit: 2023-12-30 | Discharge: 2023-12-30 | Disposition: A | Discharge status: Home / Self Care | Payer: No Typology Code available for payment source

## 2023-12-30 ENCOUNTER — Emergency Department (HOSPITAL_COMMUNITY): Payer: No Typology Code available for payment source

## 2023-12-30 DIAGNOSIS — N390 Urinary tract infection, site not specified: Secondary | ICD-10-CM | POA: Diagnosis not present

## 2023-12-30 DIAGNOSIS — R519 Headache, unspecified: Secondary | ICD-10-CM | POA: Insufficient documentation

## 2023-12-30 DIAGNOSIS — R8289 Other abnormal findings on cytological and histological examination of urine: Secondary | ICD-10-CM | POA: Diagnosis not present

## 2023-12-30 LAB — CBC WITH DIFFERENTIAL/PLATELET
Abs Immature Granulocytes: 0.01 10*3/uL (ref 0.00–0.07)
Basophils Absolute: 0 10*3/uL (ref 0.0–0.1)
Basophils Relative: 0 %
Eosinophils Absolute: 0 10*3/uL (ref 0.0–0.5)
Eosinophils Relative: 0 %
HCT: 41.9 % (ref 36.0–46.0)
Hemoglobin: 14 g/dL (ref 12.0–15.0)
Immature Granulocytes: 0 %
Lymphocytes Relative: 26 %
Lymphs Abs: 1.9 10*3/uL (ref 0.7–4.0)
MCH: 29.5 pg (ref 26.0–34.0)
MCHC: 33.4 g/dL (ref 30.0–36.0)
MCV: 88.2 fL (ref 80.0–100.0)
Monocytes Absolute: 0.3 10*3/uL (ref 0.1–1.0)
Monocytes Relative: 4 %
Neutro Abs: 4.9 10*3/uL (ref 1.7–7.7)
Neutrophils Relative %: 70 %
Platelets: 138 10*3/uL — ABNORMAL LOW (ref 150–400)
RBC: 4.75 MIL/uL (ref 3.87–5.11)
RDW: 13.1 % (ref 11.5–15.5)
WBC: 7.1 10*3/uL (ref 4.0–10.5)
nRBC: 0 % (ref 0.0–0.2)

## 2023-12-30 LAB — URINALYSIS, ROUTINE W REFLEX MICROSCOPIC
Bacteria, UA: NONE SEEN
Bilirubin Urine: NEGATIVE
Glucose, UA: NEGATIVE mg/dL
Hgb urine dipstick: NEGATIVE
Ketones, ur: NEGATIVE mg/dL
Nitrite: NEGATIVE
Protein, ur: NEGATIVE mg/dL
Specific Gravity, Urine: 1.008 (ref 1.005–1.030)
pH: 5 (ref 5.0–8.0)

## 2023-12-30 LAB — COMPREHENSIVE METABOLIC PANEL
ALT: 21 U/L (ref 0–44)
AST: 14 U/L — ABNORMAL LOW (ref 15–41)
Albumin: 3.9 g/dL (ref 3.5–5.0)
Alkaline Phosphatase: 52 U/L (ref 38–126)
Anion gap: 9 (ref 5–15)
BUN: 8 mg/dL (ref 6–20)
CO2: 23 mmol/L (ref 22–32)
Calcium: 9.2 mg/dL (ref 8.9–10.3)
Chloride: 105 mmol/L (ref 98–111)
Creatinine, Ser: 0.72 mg/dL (ref 0.44–1.00)
GFR, Estimated: >60 mL/min (ref >60)
Glucose, Bld: 95 mg/dL (ref 70–99)
Potassium: 3.9 mmol/L (ref 3.5–5.1)
Sodium: 137 mmol/L (ref 135–145)
Total Bilirubin: 0.3 mg/dL (ref 0.0–1.2)
Total Protein: 7.3 g/dL (ref 6.5–8.1)

## 2023-12-30 LAB — HCG, SERUM, QUALITATIVE: Preg, Serum: NEGATIVE

## 2023-12-30 MED ORDER — DIPHENHYDRAMINE HCL 50 MG/ML IJ SOLN
12.5000 mg | Freq: Once | INTRAMUSCULAR | Status: AC
  Administered 2023-12-30: 12.5 mg via INTRAVENOUS
  Filled 2023-12-30: qty 1

## 2023-12-30 MED ORDER — KETOROLAC TROMETHAMINE 10 MG PO TABS: 10.0000 mg | ORAL_TABLET | Freq: Four times a day (QID) | ORAL | 0 refills | Status: DC | PRN

## 2023-12-30 MED ORDER — PROCHLORPERAZINE EDISYLATE 10 MG/2ML IJ SOLN
5.0000 mg | Freq: Once | INTRAMUSCULAR | Status: AC
  Administered 2023-12-30: 5 mg via INTRAVENOUS
  Filled 2023-12-30: qty 2

## 2023-12-30 MED ORDER — NITROFURANTOIN MONOHYD MACRO 100 MG PO CAPS: 100.0000 mg | ORAL_CAPSULE | Freq: Two times a day (BID) | ORAL | 0 refills | Status: DC

## 2023-12-30 MED ORDER — KETOROLAC TROMETHAMINE 15 MG/ML IJ SOLN
15.0000 mg | Freq: Once | INTRAMUSCULAR | Status: AC
  Administered 2023-12-30: 15 mg via INTRAVENOUS
  Filled 2023-12-30: qty 1

## 2023-12-30 MED ORDER — NITROFURANTOIN MONOHYD MACRO 100 MG PO CAPS
100.0000 mg | ORAL_CAPSULE | Freq: Once | ORAL | Status: AC
  Administered 2023-12-30: 100 mg via ORAL
  Filled 2023-12-30: qty 1

## 2023-12-30 MED ORDER — SODIUM CHLORIDE 0.9 % IV BOLUS
1000.0000 mL | Freq: Once | INTRAVENOUS | Status: AC
  Administered 2023-12-30: 1000 mL via INTRAVENOUS

## 2023-12-30 NOTE — ED Triage Notes (Signed)
 Spanish interpreter used for triage:  Pt had a lumbar puncture on Monday and since then she has had a headache, nausea, neck pain and abd inflammation. Pt states she thinks her hernia has been bothering her. Pt also c.o urination frequency.

## 2023-12-30 NOTE — Discharge Instructions (Signed)
 Your workup today was reassuring.  Take the Toradol as needed for pain.  Take the Macrobid for the urine infection.  Follow-up with your doctor.  Return to the ER for worsening symptoms.

## 2023-12-30 NOTE — ED Provider Triage Note (Signed)
 Emergency Medicine Provider Triage Evaluation Note  Belinda Mcintosh , a 43 y.o. female  was evaluated in triage.  Pt complains of headache, frequent urinating and headache after having an LP  Review of Systems  Positive: Low back pain Negative: fever  Physical Exam  BP 118/69 (BP Location: Right Arm)   Pulse 77   Temp 99 F (37.2 C) (Oral)   Resp 18   LMP 12/09/2023   SpO2 100%  Gen:   Awake, no distress   Resp:  Normal effort  MSK:   Moves extremities without difficulty  Other:    Medical Decision Making  Medically screening exam initiated at 4:45 PM.  Appropriate orders placed.  Belinda Mcintosh was informed that the remainder of the evaluation will be completed by another provider, this initial triage assessment does not replace that evaluation, and the importance of remaining in the ED until their evaluation is complete.     Elson Areas, [place] 12/30/23 1645

## 2023-12-30 NOTE — ED Provider Notes (Signed)
 [place] EMERGENCY DEPARTMENT AT [facility] Provider Note   CSN: [id number] Arrival date & time: 12/30/23  1622     History  Chief Complaint  Patient presents with   Headache   Urinary Frequency    Belinda Mcintosh is a 43 y.o. female.  43 year old female with no reported past medical history presenting to the emergency department today primarily with concerns of increased urinary frequency and urgency.  Patient also states she has been having some bloating in her abdomen.  She states that she did have a lumbar puncture on Monday for outpatient workup.  She has been having some positional headaches now since then.  She initially went to the [facility] and declined any treatment that time earlier this week.  She states that she has been taking Tylenol and her headaches do seem to be getting better.  She states that she is having some aching in her neck and back but denies any neck stiffness.  She denies any fevers at home.  She states that she came in predominantly today due to some intermittent abdominal discomfort.  She states that she has been urinating more than normal as well.  She denies any vision changes, weakness, numbness, or tingling.   Headache Urinary Frequency Associated symptoms include headaches.       Home Medications Prior to Admission medications   Medication Sig Start Date End Date Taking? Authorizing Provider  ketorolac (TORADOL) 10 MG tablet Take 1 tablet (10 mg total) by mouth every 6 (six) hours as needed. 12/30/23  Yes Durwin Glaze, MD  nitrofurantoin, macrocrystal-monohydrate, (MACROBID) 100 MG capsule Take 1 capsule (100 mg total) by mouth 2 (two) times daily. 12/30/23  Yes Durwin Glaze, MD  cetirizine (ZYRTEC) 10 MG tablet Take 1 tablet by mouth daily. 12/12/23 12/11/24  [provider]      Allergies    Ivp dye [iodinated contrast media], Shrimp [shellfish allergy], Gabapentin, Rocephin [ceftriaxone], and Tramadol    Review of  Systems   Review of Systems  Genitourinary:  Positive for frequency.  Neurological:  Positive for headaches.  All other systems reviewed and are negative.   Physical Exam Updated Vital Signs BP 116/62 (BP Location: Right Arm)   Pulse 90   Temp 97.9 F (36.6 C) (Oral)   Resp 18   LMP 12/09/2023   SpO2 98%  Physical Exam Vitals and nursing note reviewed.   Gen: NAD Eyes: PERRL, EOMI HEENT: no oropharyngeal swelling Neck: trachea midline, no meningismus Resp: clear to auscultation bilaterally Card: RRR, no murmurs, rubs, or gallops Abd: Umbilical hernia noted, patient has mild diffuse tenderness without guarding or rebound Extremities: no calf tenderness, no edema Vascular: 2+ radial pulses bilaterally, 2+ DP pulses bilaterally Neuro: Cranial nerves intact, equal strength and sensation throughout bilateral upper and lower extremities with no dysmetria on finger-to-nose testing Skin: no rashes Psyc: acting appropriately   ED Results / Procedures / Treatments   Labs (all labs ordered are listed, but only abnormal results are displayed) Labs Reviewed  URINALYSIS, ROUTINE W REFLEX MICROSCOPIC - Abnormal; Notable for the following components:      Result Value   Color, Urine STRAW (*)    Leukocytes,Ua MODERATE (*)    All other components within normal limits  CBC WITH DIFFERENTIAL/PLATELET - Abnormal; Notable for the following components:   Platelets 138 (*)    All other components within normal limits  COMPREHENSIVE METABOLIC PANEL - Abnormal; Notable for the following components:  AST 14 (*)    All other components within normal limits  HCG, SERUM, QUALITATIVE    EKG None  Radiology CT ABDOMEN PELVIS WO CONTRAST Result Date: 12/30/2023 CLINICAL DATA:  Abdominal pain. EXAM: CT ABDOMEN AND PELVIS WITHOUT CONTRAST TECHNIQUE: Multidetector CT imaging of the abdomen and pelvis was performed following the standard protocol without IV contrast. RADIATION DOSE REDUCTION:  This exam was performed according to the departmental dose-optimization program which includes automated exposure control, adjustment of the mA and/or kV according to patient size and/or use of iterative reconstruction technique. COMPARISON:  CT scan 08/20/2023 FINDINGS: Lower chest: The lung bases are clear of acute process. No pleural effusion or pulmonary lesions. The heart is normal in size. No pericardial effusion. The distal esophagus and aorta are unremarkable. Hepatobiliary: No focal liver abnormality is seen. No gallstones, gallbladder wall thickening, or biliary dilatation. Pancreas: Unremarkable. No pancreatic ductal dilatation or surrounding inflammatory changes. Spleen: Normal in size without focal abnormality. Adrenals/Urinary Tract: The adrenal glands are normal. No renal, ureteral or bladder calculi. No worrisome renal or bladder lesions without contrast. Mild bladder wall thickening could be due to lack of distension but could not exclude cystitis. Recommend correlation with urinalysis. Stomach/Bowel: The stomach, duodenum, small bowel and colon are grossly normal without oral contrast. No inflammatory changes, mass lesions or obstructive findings. The appendix is surgically absent. Vascular/Lymphatic: The aorta is normal in caliber. No atheroscerlotic calcifications. No mesenteric of retroperitoneal mass or adenopathy. Small scattered lymph nodes are noted. Reproductive: The uterus and ovaries are unremarkable. Other: No pelvic mass or adenopathy. No free pelvic fluid collections. No inguinal mass or adenopathy. Small periumbilical abdominal wall hernia containing fat. Musculoskeletal: No significant bony findings. IMPRESSION: 1. No renal, ureteral or bladder calculi. 2. Mild bladder wall thickening could be due to lack of distension but could not exclude cystitis. Recommend correlation with urinalysis. 3. No other significant abdominal/pelvic findings, mass lesions or adenopathy. Electronically  Signed   By: Rudie Meyer M.D.   On: 12/30/2023 18:23    Procedures Procedures    Medications Ordered in ED Medications  nitrofurantoin (macrocrystal-monohydrate) (MACROBID) capsule 100 mg (has no administration in time range)  ketorolac (TORADOL) 15 MG/ML injection 15 mg (15 mg Intravenous Given 12/30/23 1845)  prochlorperazine (COMPAZINE) injection 5 mg (5 mg Intravenous Given 12/30/23 1845)  diphenhydrAMINE (BENADRYL) injection 12.5 mg (12.5 mg Intravenous Given 12/30/23 1845)  sodium chloride 0.9 % bolus 1,000 mL (1,000 mLs Intravenous New Bag/Given 12/30/23 1844)    ED Course/ Medical Decision Making/ A&P                                 Medical Decision Making 43 year old female with no reported past medical history presenting to the emergency department today with complaints predominantly today some abdominal discomfort and flank pain.  I will further evaluate the patient here with basic labs including LFTs and lipase to evaluate for hepatobiliary pathology or pancreatitis.  Will obtain a CT scan to evaluate for ureterolithiasis, diverticulitis, colitis, bowel obstruction, or other intra-abdominal pathology.  I will give the patient Toradol here as well as Compazine and Benadryl for her nausea as well as for her headache.  She states the headaches are getting better and she is actually less concerned about this today.  She does not have any signs consistent with meningitis at this time.  The patient's labs here are reassuring.  Urinalysis does show some leukocytes and  the patient does have a thickened bladder wall on CT.  The remainder of her CT scan is unremarkable.  She is covered with Macrobid.  She does not have any CVA tenderness to suggest pyelonephritis at this time.  Her symptoms resolved with the medications here.  She is discharged with return precautions.  Amount and/or Complexity of Data Reviewed Labs: ordered. Radiology: ordered.  Risk Prescription drug  management.           Final Clinical Impression(s) / ED Diagnoses Final diagnoses:  Nonintractable headache, unspecified chronicity pattern, unspecified headache type  Urinary tract infection without hematuria, site unspecified    Rx / DC Orders ED Discharge Orders          Ordered    nitrofurantoin, macrocrystal-monohydrate, (MACROBID) 100 MG capsule  2 times daily        12/30/23 1939    ketorolac (TORADOL) 10 MG tablet  Every 6 hours PRN        12/30/23 1939              Durwin Glaze, MD 12/30/23 1940

## 2024-01-01 ENCOUNTER — Other Ambulatory Visit: Payer: Self-pay

## 2024-01-01 ENCOUNTER — Emergency Department (HOSPITAL_COMMUNITY)
Admission: EM | Admit: 2024-01-01 | Discharge: 2024-01-02 | Disposition: A | Discharge status: Home / Self Care | Payer: No Typology Code available for payment source | Attending: Emergency Medicine | Admitting: Emergency Medicine

## 2024-01-01 DIAGNOSIS — R350 Frequency of micturition: Secondary | ICD-10-CM | POA: Diagnosis present

## 2024-01-01 DIAGNOSIS — M549 Dorsalgia, unspecified: Secondary | ICD-10-CM | POA: Insufficient documentation

## 2024-01-02 LAB — URINALYSIS, ROUTINE W REFLEX MICROSCOPIC
Bilirubin Urine: NEGATIVE
Glucose, UA: NEGATIVE mg/dL
Hgb urine dipstick: NEGATIVE
Ketones, ur: 5 mg/dL — AB
Leukocytes,Ua: NEGATIVE
Nitrite: NEGATIVE
Protein, ur: NEGATIVE mg/dL
Specific Gravity, Urine: 1.002 — ABNORMAL LOW (ref 1.005–1.030)
pH: 5 (ref 5.0–8.0)

## 2024-01-02 LAB — OLIGOCLONAL BANDS, CSF + SERM

## 2024-01-02 MED ORDER — PHENAZOPYRIDINE HCL 100 MG PO TABS: 100.0000 mg | ORAL_TABLET | Freq: Three times a day (TID) | ORAL | Status: DC

## 2024-01-02 MED ORDER — PHENAZOPYRIDINE HCL 100 MG PO TABS
100.0000 mg | ORAL_TABLET | Freq: Once | ORAL | Status: AC
  Administered 2024-01-02: 100 mg via ORAL
  Filled 2024-01-02: qty 1

## 2024-01-02 NOTE — ED Provider Notes (Signed)
 [place] EMERGENCY DEPARTMENT AT [facility] Provider Note   CSN: [id number] Arrival date & time: 01/01/24  2304     History  Chief Complaint  Patient presents with   Urinary Frequency    Patient reported having and lumbar puncture last week Monday and having urine frequency. Seen here 3 days ago for same symptoms. Patient felt pressure in her abdominal area and told her bladder is thick when she was here 3 days ago. Patient feels like she has to pee often and pain in her back.     Belinda Mcintosh is a 43 y.o. female.  HPI     This a 43 year old female who presents with ongoing urinary frequency.  Was seen and evaluated several days ago for the same.  She was treated for possible UTI with Macrobid.  No urine culture results are available.  She states she has had the symptoms since having an LP.  She had a CT scan several days ago that showed a thickened bladder.  She is not having any fevers but is having some back pain.  Reports that she has been taking her Macrobid.  Home Medications Prior to Admission medications   Medication Sig Start Date End Date Taking? Authorizing Provider  cetirizine (ZYRTEC) 10 MG tablet Take 1 tablet by mouth daily. 12/12/23 12/11/24  [provider]  ketorolac (TORADOL) 10 MG tablet Take 1 tablet (10 mg total) by mouth every 6 (six) hours as needed. 12/30/23   Durwin Glaze, MD  nitrofurantoin, macrocrystal-monohydrate, (MACROBID) 100 MG capsule Take 1 capsule (100 mg total) by mouth 2 (two) times daily. 12/30/23   Durwin Glaze, MD      Allergies    Ivp dye [iodinated contrast media], Shrimp [shellfish allergy], Gabapentin, Rocephin [ceftriaxone], and Tramadol    Review of Systems   Review of Systems  Constitutional:  Negative for fever.  Genitourinary:  Positive for frequency. Negative for difficulty urinating and dysuria.  Musculoskeletal:  Positive for back pain.  All other systems reviewed and are negative.   Physical  Exam Updated Vital Signs BP 109/69 (BP Location: Left Arm)   Pulse 75   Temp 98.2 F (36.8 C) (Oral)   Resp 19   LMP 12/09/2023   SpO2 96%  Physical Exam Vitals and nursing note reviewed.  Constitutional:      Appearance: She is well-developed. She is not ill-appearing.  HENT:     Head: Normocephalic and atraumatic.  Eyes:     Pupils: Pupils are equal, round, and reactive to light.  Cardiovascular:     Rate and Rhythm: Normal rate and regular rhythm.     Heart sounds: Normal heart sounds.  Pulmonary:     Effort: Pulmonary effort is normal. No respiratory distress.     Breath sounds: No wheezing.  Abdominal:     Palpations: Abdomen is soft.     Tenderness: There is no abdominal tenderness. There is no right CVA tenderness or left CVA tenderness.  Musculoskeletal:     Cervical back: Neck supple.  Skin:    General: Skin is warm and dry.  Neurological:     Mental Status: She is alert and oriented to person, place, and time.  Psychiatric:        Mood and Affect: Mood normal.     ED Results / Procedures / Treatments   Labs (all labs ordered are listed, but only abnormal results are displayed) Labs Reviewed  URINALYSIS, ROUTINE W REFLEX MICROSCOPIC - Abnormal;  Notable for the following components:      Result Value   Color, Urine COLORLESS (*)    Specific Gravity, Urine 1.002 (*)    Ketones, ur 5 (*)    All other components within normal limits  URINE CULTURE    EKG None  Radiology No results found.  Procedures Procedures    Medications Ordered in ED Medications  phenazopyridine (PYRIDIUM) tablet 100 mg (100 mg Oral Given 01/02/24 0432)    ED Course/ Medical Decision Making/ A&P                                 Medical Decision Making Amount and/or Complexity of Data Reviewed Labs: ordered.  Risk Prescription drug management.   This patient presents to the ED for concern of urinary frequency, this involves an extensive number of treatment options,  and is a complaint that carries with it a high risk of complications and morbidity.  I considered the following differential and admission for this acute, potentially life threatening condition.  The differential diagnosis includes UTI, interstitial cystitis, incomplete voiding  MDM:    This is a 43 year old female who presents with urinary frequency.  She has not seen much improvement since starting Macrobid.  Unfortunately I do not have a urine culture.  I have repeated urinalysis and urine culture today.  Urinalysis actually looks better.  Patient's postvoid residual was 30 cc.  She is adequately emptying her bladder.  She may have some bladder spasm.  Patient was given Pyridium.  She has no CVA tenderness.  No fevers and is hemodynamically stable.  Would stay the course with Macrobid until urine culture results are available.  (Labs, imaging, consults)  Labs: I Ordered, and personally interpreted labs.  The pertinent results include: Urinalysis, urine culture  Imaging Studies ordered: I ordered imaging studies including the imaging from several days ago. I independently visualized and interpreted imaging. I agree with the radiologist interpretation  Additional history obtained from chart review.  External records from outside source obtained and reviewed including prior evaluations  Cardiac Monitoring: The patient was not maintained on a cardiac monitor.  If on the cardiac monitor, I personally viewed and interpreted the cardiac monitored which showed an underlying rhythm of: N/A  Reevaluation: After the interventions noted above, I reevaluated the patient and found that they have :stayed the same  Social Determinants of Health:  lives independently  Disposition: Discharge  Co morbidities that complicate the patient evaluation  Past Medical History:  Diagnosis Date   Abdominal pain    Flank pain    History of kidney stones    Low back pain    Palpitations    Umbilical hernia  2018     Medicines Meds ordered this encounter  Medications   DISCONTD: phenazopyridine (PYRIDIUM) tablet 100 mg   phenazopyridine (PYRIDIUM) tablet 100 mg    I have reviewed the patients home medicines and have made adjustments as needed  Problem List / ED Course: Problem List Items Addressed This Visit   None Visit Diagnoses       Urinary frequency    -  Primary                   Final Clinical Impression(s) / ED Diagnoses Final diagnoses:  Urinary frequency    Rx / DC Orders ED Discharge Orders     None         Bree Heinzelman,  Mayer Masker, MD 01/02/24 [phone]

## 2024-01-02 NOTE — ED Notes (Signed)
 Pt does not want to wait for paper work

## 2024-01-02 NOTE — ED Notes (Addendum)
 Post-void residual- <73ml/ MD made aware

## 2024-01-03 ENCOUNTER — Telehealth: Payer: Self-pay | Admitting: Neurology

## 2024-01-03 ENCOUNTER — Other Ambulatory Visit: Payer: Self-pay

## 2024-01-03 DIAGNOSIS — R42 Dizziness and giddiness: Secondary | ICD-10-CM

## 2024-01-03 DIAGNOSIS — R209 Unspecified disturbances of skin sensation: Secondary | ICD-10-CM

## 2024-01-03 DIAGNOSIS — M545 Low back pain, unspecified: Secondary | ICD-10-CM

## 2024-01-03 DIAGNOSIS — H539 Unspecified visual disturbance: Secondary | ICD-10-CM

## 2024-01-03 DIAGNOSIS — R9089 Other abnormal findings on diagnostic imaging of central nervous system: Secondary | ICD-10-CM

## 2024-01-03 DIAGNOSIS — R5383 Other fatigue: Secondary | ICD-10-CM

## 2024-01-03 DIAGNOSIS — M542 Cervicalgia: Secondary | ICD-10-CM

## 2024-01-03 LAB — URINE CULTURE

## 2024-01-03 NOTE — Telephone Encounter (Addendum)
 Patient is complaining of frequent urination and back pain since her lumbar puncture. She had been to ED and urgent care for these issues and gotten treatment for UTI, though her UA was not consistent. She previously had a headache that has resolved. I reassured her that her back would likely continue to improve and urinary symptoms was not a known complication of LP.  Her LP showed normal opening pressure and all tests including WBC and protein and IgG index were normal. Her oligoclonal bands were technically negative, but there were 3 bands (4 is considered positive). Given this borderline result and borderline MRI, I recommended patient see an MS specialist as this is more complex regarding whether to treat or not. She preferred referral to [facility]. Patient also requested a MyChart message with this info as she is going to [place] for second opinion tomorrow and wants to know what to tell them. I will send this to her.  All questions were answered.  Jacquelyne Balint, MD [facility]

## 2024-01-03 NOTE — Telephone Encounter (Signed)
 Pt wants to see Dr.Hill urgently. She said she is in so much pain and doesn't know what to do. She has been to urgent cares, and ED. They keep telling her she needs to f/u with neurology. She said this is from getting the lumbar puncture and they hit a nerve. She can't hold her bladder and she is in pain. I told her I would send message back and let hill know she had notes I her chart from ED

## 2024-01-15 LAB — CULTURE, FUNGUS WITHOUT SMEAR

## 2024-01-17 NOTE — Progress Notes (Deleted)
 NEUROLOGY FOLLOW UP OFFICE NOTE  Belinda Mcintosh [id number]  Subjective:  Belinda Mcintosh is a 43 y.o. year old  right-handed female with a medical history of kidney stones who presents to neurology clinic with lightheadedness/dizziness, hand paresthesias, back pain who we last saw on 11/16/23 for lightheadedness/dizziness, hand paresthesias, back pain.  To briefly review: 11/16/23: Patient has a long history of low back pain (> 10 years ago). She was in [place] and told it was probably a pinched nerve in back. She was started on gabapentin which resolved pain, so she stopped. She moved to [place]. She had "cystitis" and started on Gemtesa. This did not help. She also mentioned that her hands had numbness and tingling in her hands while pregnant. This started around 2022. She also had return of back pain. She was put on tramadol and gabapentin. She felt these medications caused side effects and depression, so she stopped.    She started working out about 1 month ago and stretching her back. She noticed her back pain greatly improved. This also resolved the symptoms in her hands. She believes that there were pinched nerves.    She continues to have dizziness and confusion though. She describes it as her eyes are heavy. She feels like she is a little bit drunk, like she has been drinking. Or like she was hit on the head and stunned afterward. She denies headache but has tension in her neck. She denies room spinning or boat rocking sensation.    She thinks she has lost weight recently, but not sure. She denies fevers or night sweats. Per 05/17/22 office visit note, her weight was similar to today.   EtOH use: none Restrictive diet: none Family history: sister with fibromyalgia, brother with sciatica, other sister with a lot of nerve problems but doesn't remember name   She lives with her sister and 2 children. 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  Most recent Assessment and Plan (11/16/23): Belinda Mcintosh is  a 43 y.o. female who presents for evaluation of lightheadedness, hand numbness/tingling, and low back pain. She has a relevant medical history of kidney stones. Her neurological examination is essentially normal today. The etiology of patient's symptoms is currently unclear. She does have neck tightness and tenderness to palpation. She noticed that since exercising and stretching that her low back pain and hand numbness and tingling has improved, but not the intermittent lightheadedness. I will send labs looking for treatable causes and refer to PT to see if this can help her further. I will consider further work up if this does not help.   PLAN: -Blood work: B1, B12, vit D, TSH -Will follow up on MRI brain read -Will refer to PT for cervicalgia and chronic low back -Will consider MRI of spine or EMG if symptoms worsen or persist  Since their last visit: *** Blood work was unremarkable. MRI brain that was pending read suggested white matter abnormalities, but given the poor quality, we agreed to repeat with MRI cervical spine as well. MRI cervical spine showed no significant abnormalities, however MRI brain showed more than expected T2 hyperintensities. She has no significant vascular risk factors, so there was concern for possible demyelination. I recommended LP which she had on 12/24/23. CSF analysis was normal, but there were 3 oligoclonal bands (4 being positive). Given the indeterminate MRI brain and borderline OCB, I recommended patient get opinion of MS specialist, which she preferred to get at [facility]. ***  After LP, she  had headaches, back pain, and urinary frequency.***  MEDICATIONS:  Outpatient Encounter Medications as of 01/24/2024  Medication Sig   cetirizine (ZYRTEC) 10 MG tablet Take 1 tablet by mouth daily.   ketorolac (TORADOL) 10 MG tablet Take 1 tablet (10 mg total) by mouth every 6 (six) hours as needed.   nitrofurantoin, macrocrystal-monohydrate, (MACROBID) 100 MG capsule Take 1  capsule (100 mg total) by mouth 2 (two) times daily.   Facility-Administered Encounter Medications as of 01/24/2024  Medication   lidocaine (XYLOCAINE) 5 % ointment    PAST MEDICAL HISTORY: Past Medical History:  Diagnosis Date   Abdominal pain    Flank pain    History of kidney stones    Low back pain    Palpitations    Umbilical hernia 2018    PAST SURGICAL HISTORY: Past Surgical History:  Procedure Laterality Date   APPENDECTOMY     COSMETIC SURGERY     INDUCED ABORTION     LAPAROSCOPIC APPENDECTOMY N/A 08/21/2023   Procedure: APPENDECTOMY LAPAROSCOPIC;  Surgeon: Darnell Level, MD;  Location: [facility];  Service: General;  Laterality: N/A;   LIPOSUCTION     NOSE SURGERY      ALLERGIES: Allergies  Allergen Reactions   Ivp Dye [Iodinated Contrast Media] Anaphylaxis, Hives and Cough   Shrimp [Shellfish Allergy] Itching   Gabapentin Itching    Depression and itching   Rocephin [Ceftriaxone] Hives   Tramadol Anxiety    FAMILY HISTORY: Family History  Problem Relation Age of Onset   Hypertension Mother    Diabetes Mother    Breast cancer Mother        Breast Cancer   Cancer Father    Hypertension Father    Diabetes Father    Cancer Sister    Fibromyalgia Sister    Other Sister        spinal stenosis   Alcohol abuse Neg Hx    Arthritis Neg Hx    Asthma Neg Hx    COPD Neg Hx    Depression Neg Hx    Drug abuse Neg Hx    Early death Neg Hx    Heart disease Neg Hx    Hearing loss Neg Hx    Hyperlipidemia Neg Hx    Kidney disease Neg Hx    Learning disabilities Neg Hx    Mental illness Neg Hx    Mental retardation Neg Hx    Miscarriages / Stillbirths Neg Hx    Stroke Neg Hx    Vision loss Neg Hx    Varicose Veins Neg Hx     SOCIAL HISTORY: Social History   Tobacco Use   Smoking status: Former    Current packs/day: 0.00    Types: Cigarettes    Quit date: 09/21/2016    Years since quitting: 7.3    Passive exposure: Never   Smokeless tobacco: Never   Vaping Use   Vaping status: Never Used  Substance Use Topics   Alcohol use: No   Drug use: No   Social History   Social History Narrative   ** Merged History Encounter **    Are you right handed or left handed? right   Are you currently employed ?    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   Who lives with you? Kids and sister   What type of home do you live in: 1 story or 2 story? two   Caffeine none  Objective:  Vital Signs:  LMP 12/09/2023   ***  Labs and Imaging review: New results: 11/16/23: B1 wnl B12: 497 Vit D wnl  External labs (12/07/23): CRP wnl ESR 22 TSH wnl  12/20/23: Ferritin 30 HIV non-reactive Hep C non-reactive Folate wnl  CSF (12/24/23): opening pressure 12 cm H2O Oligoclonal bands: 3 OCB present (4 is positive) 8 RBC, 2 WBC, 32 protein, 54 glucose IgG index wnl Culture and stain negative (bacterial and fungal) Cytology negative  MRI brain w/wo contrast (12/03/23): FINDINGS: Brain: Cerebral volume within normal limits. Patchy and confluent T2/FLAIR hyperintensity involving the periventricular, deep, and subcortical white matter both cerebral hemispheres. Scattered corresponding T1 black holes noted. No significant infratentorial involvement. Overall, findings are nonspecific, but advanced for age, and overall moderate in nature.   No abnormal foci of restricted diffusion to suggest acute or subacute ischemia. Gray-white matter differentiation maintained. No areas of chronic cortical infarction or other insult. No acute or chronic intracranial blood products.   No mass lesion, midline shift or mass effect. No hydrocephalus or extra-axial fluid collection. Pituitary gland and suprasellar region within normal limits.   No abnormal enhancement.   Vascular: Major intracranial vascular flow voids are maintained.   Skull and upper cervical spine: Craniocervical junction within normal limits. Bone marrow  signal intensity normal. No scalp soft tissue abnormality.   Sinuses/Orbits: Globes and orbital soft tissues within normal limits. Paranasal sinuses are largely clear. No significant mastoid effusion.   Other: None.   IMPRESSION: 1. No acute intracranial abnormality. 2. Moderate cerebral white matter disease, nonspecific, but advanced for age. Differential considerations are broad, and include sequelae of accelerated/hereditary chronic small-vessel ischemia, changes related to prior trauma, hypercoagulable state, vasculitis, sequelae of complicated migraines, prior infectious or inflammatory process, or demyelination. No abnormal enhancement.  MRI cervical spine (12/03/23): IMPRESSION: 1. Normal MRI appearance of the cervical spinal cord. No abnormal enhancement. 2. Small right paracentral disc protrusion at C4-5 without significant stenosis. 3. Mild disc bulge with uncovertebral spurring at C5-6 with resultant mild left C6 foraminal stenosis. 4. Small central disc protrusion at C6-7 without significant stenosis or impingement.   Previously reviewed results: Lab Results  Component Value Date    HGBA1C 5.4 05/17/2022      Recent Labs       Lab Results  Component Value Date    VITAMINB12 603 09/17/2020      Recent Labs       Lab Results  Component Value Date    TSH 1.320 09/17/2020      Recent Labs[] Expand by Default       Lab Results  Component Value Date    ESRSEDRATE 30 11/18/2020      09/04/23: CBC significant for platelets 130 (chronic) BMP unremarkable   MRI brain wo contrast (11/03/23): IMPRESSION: Degraded by artifact.  The T2 FLAIR sequence is nondiagnostic.   No evidence of recent infarction, hemorrhage, or mass.   Probable nonspecific gliosis/demyelination in the frontoparietal white matter bilaterally.  Assessment/Plan:  This is Belinda Mcintosh, a 43 y.o. female with: ***   Plan: ***  Return to clinic in ***  Total time spent  reviewing records, interview, history/exam, documentation, and coordination of care on day of encounter:  *** min  Jacquelyne Balint, MD

## 2024-01-24 ENCOUNTER — Encounter: Payer: Self-pay | Admitting: Neurology

## 2024-01-24 ENCOUNTER — Ambulatory Visit: Payer: No Typology Code available for payment source | Admitting: Neurology

## 2024-01-31 ENCOUNTER — Ambulatory Visit (INDEPENDENT_AMBULATORY_CARE_PROVIDER_SITE_OTHER): Payer: No Typology Code available for payment source | Admitting: Family Medicine

## 2024-01-31 ENCOUNTER — Encounter (HOSPITAL_BASED_OUTPATIENT_CLINIC_OR_DEPARTMENT_OTHER): Payer: Self-pay | Admitting: Family Medicine

## 2024-01-31 VITALS — BP 100/68 | HR 64 | Ht 61.0 in | Wt 128.3 lb

## 2024-01-31 DIAGNOSIS — F419 Anxiety disorder, unspecified: Secondary | ICD-10-CM | POA: Diagnosis not present

## 2024-01-31 DIAGNOSIS — R6889 Other general symptoms and signs: Secondary | ICD-10-CM

## 2024-01-31 DIAGNOSIS — H539 Unspecified visual disturbance: Secondary | ICD-10-CM | POA: Diagnosis not present

## 2024-01-31 DIAGNOSIS — R42 Dizziness and giddiness: Secondary | ICD-10-CM
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
## 2024-01-31 NOTE — Patient Instructions (Addendum)
 Check with dentist for mouth guard   Ophthalmology Offices   [facility]  [address]  [place], [place] [address] [phone]  [facility] [address] [place], [place] [address] Phone: [phone]  [facility] [address] [place], [place] [address] Phone: [phone]  [facility]  Optometrist [address]  [phone]  [facility] Optometrist [address]  [phone]

## 2024-02-01 ENCOUNTER — Encounter (HOSPITAL_BASED_OUTPATIENT_CLINIC_OR_DEPARTMENT_OTHER): Payer: Self-pay | Admitting: Family Medicine

## 2024-02-01 NOTE — Telephone Encounter (Signed)
 Jon Gills, Please see mychart message sent by pt and advise.

## 2024-02-04 NOTE — Addendum Note (Signed)
 Addended byJerre Simon on: 02/04/2024 08:23 AM   Modules accepted: Orders

## 2024-02-05 ENCOUNTER — Ambulatory Visit: Admitting: Physical Therapy
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
## 2024-02-06 ENCOUNTER — Other Ambulatory Visit: Payer: Self-pay

## 2024-02-06 ENCOUNTER — Ambulatory Visit: Attending: Family Medicine | Admitting: Physical Therapy

## 2024-02-06 ENCOUNTER — Encounter: Payer: Self-pay | Admitting: Physical Therapy

## 2024-02-06 DIAGNOSIS — R42 Dizziness and giddiness: Secondary | ICD-10-CM | POA: Insufficient documentation
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
## 2024-02-11 ENCOUNTER — Encounter: Payer: Self-pay | Admitting: Physical Therapy

## 2024-02-11 ENCOUNTER — Ambulatory Visit: Admitting: Physical Therapy

## 2024-02-11 DIAGNOSIS — R42 Dizziness and giddiness: Secondary | ICD-10-CM | POA: Diagnosis not present

## 2024-02-19 ENCOUNTER — Ambulatory Visit (INDEPENDENT_AMBULATORY_CARE_PROVIDER_SITE_OTHER): Admitting: Certified Nurse Midwife

## 2024-02-19 ENCOUNTER — Other Ambulatory Visit (HOSPITAL_COMMUNITY)
Admission: RE | Admit: 2024-02-19 | Discharge: 2024-02-19 | Disposition: A | Discharge status: Home / Self Care | Source: Ambulatory Visit | Attending: Certified Nurse Midwife | Admitting: Certified Nurse Midwife

## 2024-02-19 ENCOUNTER — Ambulatory Visit: Attending: Family Medicine | Admitting: Rehabilitative and Restorative Service Providers"

## 2024-02-19 ENCOUNTER — Telehealth: Payer: Self-pay | Admitting: Rehabilitative and Restorative Service Providers"

## 2024-02-19 ENCOUNTER — Encounter (HOSPITAL_BASED_OUTPATIENT_CLINIC_OR_DEPARTMENT_OTHER): Payer: Self-pay | Admitting: Certified Nurse Midwife

## 2024-02-19 VITALS — BP 105/61 | HR 79 | Wt 130.2 lb

## 2024-02-19 DIAGNOSIS — R1030 Lower abdominal pain, unspecified: Secondary | ICD-10-CM

## 2024-02-19 DIAGNOSIS — Z113 Encounter for screening for infections with a predominantly sexual mode of transmission: Secondary | ICD-10-CM | POA: Insufficient documentation

## 2024-02-19 DIAGNOSIS — Z23 Encounter for immunization: Secondary | ICD-10-CM

## 2024-02-19 DIAGNOSIS — Z7185 Encounter for immunization safety counseling: Secondary | ICD-10-CM

## 2024-02-19 NOTE — Telephone Encounter (Signed)
 PT reached out to Lachelle due to no show for therapy visit. She had mixed up appt days. We discussed and reviewed her schedule. She cx for 4/15 due to a trip with her kids on spring break and is planning to attend next scheduled visit on 4/22.  Farrell Broerman, PT

## 2024-02-19 NOTE — Progress Notes (Signed)
 HPI: 43 y.o. [phone] Divorced Other or two or more races female here to discuss HPV. She and her ex-husband recently had intercourse x 2. He revealed that he had genital warts. They recently traveled together to [place]. He had the warts treated while they were in [place]. Pt states he did wear a condom when they were sexually active. She has no current genital warts and no hx genital warts. She is interested in HPV vaccine.   Past Medical History:  Diagnosis Date   Abdominal pain    Acute appendicitis 08/20/2023   AMA (advanced maternal age) multigravida 35+ 05/14/2017   Anxiety    Flank pain    Genital warts complicating pregnancy, third trimester 04/30/2017   [facility] neg.     History of COVID-19 01/24/2021   History of kidney stones    Low back pain    Nasal sore 12/18/2023   Palpitations    Pelvic pain 11/20/2023   S/P laparoscopic appendectomy 11/01/2023   Shortness of breath 02/01/2021   Supervision of high risk pregnancy, antepartum 02/08/2021              Nursing Staff    Provider      Office Location     [facility]    Dating     LMP      Language     English    Anatomy US     WNL      Flu Vaccine          Genetic/Carrier Screen     NIPS:  Low risk Female   AFP:   Negative  Horizon: Negative       TDaP Vaccine      05/20/21    Hgb A1C or   GTT    Early normal 78, 153, 119  Third trimester : normal      COVID Vaccine              LAB RESULTS       SVD (spontaneous vaginal delivery) 08/21/2021   Umbilical hernia 2018    MEDS:   Current Outpatient Medications on File Prior to Visit  Medication Sig Dispense Refill   escitalopram (LEXAPRO) 10 MG tablet Take 10 mg by mouth daily.     ATORVASTATIN CALCIUM PO Take by mouth. (Patient not taking: Reported on 02/19/2024)     omeprazole (PRILOSEC) 20 MG capsule Take 20 mg by mouth daily. (Patient not taking: Reported on 02/19/2024)     pregabalin (LYRICA) 75 MG capsule Take 75 mg by mouth at bedtime.  (Patient not taking: Reported on 02/19/2024)     Current Facility-Administered Medications on File Prior to Visit  Medication Dose Route Frequency Provider Last Rate Last Admin   lidocaine (XYLOCAINE) 5 % ointment   Topical TID PRN Milas Hock, MD        ALLERGIES: Ivp dye [iodinated contrast media], Shrimp [shellfish allergy], Gabapentin, Prunus persica, Rocephin [ceftriaxone], and Tramadol   PHYSICAL EXAMINATION:    BP 105/61 (BP Location: Right Arm, Patient Position: Sitting, Cuff Size: Normal)   Pulse 79   Wt 130 lb 3.2 oz (59.1 kg)   LMP 02/05/2024 (Approximate)   BMI 24.60 kg/m     General appearance: alert, cooperative and appears stated age  Assessment/Plan: Lower abdominal pain (Primary) - Urine Culture  Routine screening for STI (sexually transmitted infection) - GC/Chlamydia probe amp ([place])not at [facility]  HPV vaccine counseling - HPV 9-valent vaccine,Recombinat  Need for HPV vaccination - HPV 9-valent vaccine,Recombinat - Pt will RTO 2 months for Gardasil Vaccine #2.  Letta Kocher
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
## 2024-02-20 ENCOUNTER — Encounter (HOSPITAL_BASED_OUTPATIENT_CLINIC_OR_DEPARTMENT_OTHER): Payer: Self-pay | Admitting: Certified Nurse Midwife

## 2024-02-20 LAB — GC/CHLAMYDIA PROBE AMP (~~LOC~~) NOT AT ARMC
Chlamydia: NEGATIVE
Comment: NEGATIVE
Comment: NORMAL
Neisseria Gonorrhea: NEGATIVE

## 2024-02-20 LAB — URINE CULTURE

## 2024-02-21 ENCOUNTER — Encounter (HOSPITAL_BASED_OUTPATIENT_CLINIC_OR_DEPARTMENT_OTHER): Payer: Self-pay | Admitting: Certified Nurse Midwife

## 2024-02-21 ENCOUNTER — Other Ambulatory Visit (HOSPITAL_BASED_OUTPATIENT_CLINIC_OR_DEPARTMENT_OTHER): Payer: Self-pay | Admitting: Certified Nurse Midwife

## 2024-02-21 DIAGNOSIS — N760 Acute vaginitis: Secondary | ICD-10-CM

## 2024-02-21 MED ORDER — FLUCONAZOLE 150 MG PO TABS: 150.0000 mg | ORAL_TABLET | ORAL | 2 refills | Status: DC | PRN

## 2024-02-21 MED ORDER — METRONIDAZOLE 500 MG PO TABS: 500.0000 mg | ORAL_TABLET | Freq: Two times a day (BID) | ORAL | 3 refills | Status: DC

## 2024-02-26 ENCOUNTER — Ambulatory Visit: Admitting: Physical Therapy

## 2024-03-03 NOTE — Telephone Encounter (Signed)
 Belinda Gable, do you know phone number to [facility]? I tried looking it up but cannot find it. Pt sent mychart stating that she still has not heard anything about the referral that was placed almost 1 month ago.

## 2024-03-04 ENCOUNTER — Ambulatory Visit: Admitting: Physical Therapy

## 2024-03-21 ENCOUNTER — Encounter (HOSPITAL_BASED_OUTPATIENT_CLINIC_OR_DEPARTMENT_OTHER): Payer: Self-pay | Admitting: Family Medicine

## 2024-03-21 ENCOUNTER — Telehealth (HOSPITAL_BASED_OUTPATIENT_CLINIC_OR_DEPARTMENT_OTHER): Payer: Self-pay | Admitting: Family Medicine

## 2024-03-21 ENCOUNTER — Ambulatory Visit (HOSPITAL_COMMUNITY): Admission: EM | Admit: 2024-03-21 | Discharge: 2024-03-21 | Disposition: A | Discharge status: Home / Self Care
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
## 2024-03-21 NOTE — Discharge Summary (Signed)
 Pt left AMA after triage but before being seen by provider. Pt is discharged from facility at this time.

## 2024-03-21 NOTE — Telephone Encounter (Signed)
 Patient came in looking for a walk in [facility] and wanting to know more about her referral for [facility].  She is going to go to the walk in clinic and will be back for her appt on 5/20 but wanted to know is there anywhere else you recommend.  She also said she has gallbladder issues and appendix pain.

## 2024-03-25 ENCOUNTER — Ambulatory Visit (HOSPITAL_COMMUNITY)
Admission: EM | Admit: 2024-03-25 | Discharge: 2024-03-25 | Disposition: A | Discharge status: Home / Self Care | Attending: Psychiatry | Admitting: Psychiatry

## 2024-03-25 DIAGNOSIS — F32A Depression, unspecified: Secondary | ICD-10-CM

## 2024-03-25 DIAGNOSIS — F419 Anxiety disorder, unspecified: Secondary | ICD-10-CM

## 2024-03-25 MED ORDER — ESCITALOPRAM OXALATE 20 MG PO TABS: 20.0000 mg | ORAL_TABLET | Freq: Every day | ORAL | 0 refills | Status: DC

## 2024-03-25 NOTE — Discharge Instructions (Addendum)
 Psychotherapy Because physical sensations can be related to emotional distress and health anxiety, psychotherapy -- particularly cognitive behavioral therapy (CBT) -- can be an effective treatment. CBT helps you learn skills to manage illness anxiety disorder and find different ways to manage your worries other than excessive medical testing or avoidance of medical care.  CBT can help you:  Identify your fears and beliefs about having a serious medical disease Learn alternate ways to view your body sensations by working to change unhelpful thoughts Become more aware of how your worries affect you and your behavior Change the way you respond to your body sensations and symptoms Learn skills to cope with and tolerate anxiety and stress Reduce avoidance of situations and activities due to physical sensations Reduce behaviors of frequently checking your body for signs of illness and repeatedly seeking reassurance Improve daily functioning at home, at work, in relationships and in social situations Address other mental health disorders, such as depression   Discharge recommendations:   Medications: Patient is to take medications as prescribed. The patient or patient's guardian is to contact a medical professional and/or outpatient provider to address any new side effects that develop. The patient or the patient's guardian should update outpatient providers of any new medications and/or medication changes.    Outpatient Follow up: Please review list of outpatient resources for psychiatry and counseling. Please follow up with your primary care provider for all medical related needs.    Therapy: We recommend that patient participate in individual therapy to address mental health concerns.   Atypical antipsychotics: If you are prescribed an atypical antipsychotic, it is recommended that your height, weight, BMI, blood pressure, fasting lipid panel, and fasting blood sugar be monitored by your  outpatient providers.  Safety:   The following safety precautions should be taken:   No sharp objects. This includes scissors, razors, scrapers, and putty knives.   Chemicals should be removed and locked up.   Medications should be removed and locked up.   Weapons should be removed and locked up. This includes firearms, knives and instruments that can be used to cause injury.   The patient should abstain from use of illicit substances/drugs and abuse of any medications.  If symptoms worsen or do not continue to improve or if the patient becomes actively suicidal or homicidal then it is recommended that the patient return to the closest hospital emergency department, the [facility], or call 911 for further evaluation and treatment. National Suicide Prevention Lifeline 1-800-SUICIDE or (239) 018-0611.  About 988 988 offers 24/7 access to trained crisis counselors who can help people experiencing mental health-related distress. People can call or text 988 or chat 988lifeline.org for themselves or if they are worried about a loved one who may need crisis support.   It is imperative that you follow through with treatment recommendations within 5-7 days from the day of discharge to mitigate further risk to your safety and overall mental well-being.  A list of outpatient therapy and psychiatric providers for medication management has been provided below to get you started in finding the right provider for you.            [facility] [address] [place], [place], [address] [phone] phone (Medicare, Private insurance except Tricare, [facility] [place], and [facility])  [facility] [address] [place], [place], [address] [phone] phone ([address] [employer] Caritas - [place], [place], Dufm Gibbon, Friday Health Plans, [address]  Health, BCBS Healthy [place], [address], [address], [place],  [place], [place], [place], [place], [facility], [employer], [place])  [employer] [phone] [address], Suite [place], [place], [address] [phone] phone [phone] phone [phone] fax  [facility] [address] [place], [place], [address] [phone] phone (Call to confirm insurance coverage) Consultation & Support Services     o Drop-In Hours: 1:00 PM to 5:00 PM     o Days: Monday - Thursday  Crisis Services (24/7)   [facility]   [address] [place], [place], [address] [phone] phone ([address] [place], [place], [place] Medicaid, [place] and [place], [facility], [place])      [facility]- July 2025 [address] [place], [place], [address] [phone] phone [email or web address]  (to complete the intake form and upload ID and insurance cards)  [facility] [address], [address] [place], [place], [address] [phone] phone ([address], [address], [place], [address] Complete Health, [place], [place], [place], [facility], [place], and certain Medicaid plans)  [facility] [phone] [address] [place], [place], [address] [phone] phone [phone] fax (Medicaid, Medicare, Self-pay, call about other insurance coverage)  [facility] (age 78+) [address] [place], [place], [address] [phone] hone [phone] fax ([place], [address], [place], [place], [place], [address], [place], [place], [place], [place], certain [employer], [facility], UMR)  [employer] [address], [place], [address] [phone] phone (Medicare, Medicaid, Unice Gant, call about other insurance coverage)  [facility] [address] [place], [place], [address] [phone] phone [phone] fax (Call [phone] to see what insurance is accepted) Alba Ally, MD  specializes in geropsych)  [facility], [facility]  (medication management only) [address], [address] [place], [place], [address] [phone] phone [phone] fax ([address] Medicaid, [place], [place], [place], [place], [place], [place], [place])  [facility] (medication management only) [address] [place], [place], [address] [phone] phone [phone] fax ([address], Medicare, [place], [place], Tricare [place])  [facility] [address] [place], [place], [address] [phone] phone [phone] fax ([address] [place], [place], [place], [place], [facility], [facility] Medicaid/ Health Choice)  [facility] [place], [employer]. [address] [place], [place], [address] [phone] phone [phone] fax (Medicare, Medicaid, [facility])  [facility] [address] [place], [place] [address] [phone] phone ([address] [place], [place], CBHA, MedCost, Medicare, [place], [facility]) Does genetic testing for medications; does transcranial magnetic stimulation along with basic services)  [facility] [address] [place], [place], [address] [phone] phone (Call about insurance coverage)  [facility] [address] [place], [place], [address] [phone] phone [phone] fax (Call about insurance coverage)  [facility] [address] [place], [place], [address] [phone] phone [phone] fax (Call about insurance coverage)  [employer] [phone], [place], [address] [phone] phone (Medicaid, Tricare, [place], [place], [place])  [employer] [address] [place] [place], [place], [address] [phone] phone (Medicaid, Medicare, call about other insurance coverage)  [facility] [address] [place], [place], [address] [phone] phone [phone] fax (Medicaid, Medicare, Tricare, call about other insurance coverage)  [facility] [address] [place], [address] [phone] phone ([address], [place], [place], [place], [place] types - Alliance, Secretary/administrator, Partners, [place], [place] Health Choice, Healthy [place], [place], [employer], and Complete)  [facility] [address] [place], [place], [address] [phone]  phone Completely online treatment platform Contact: Personal assistant - [employer] Specialist [phone] phone [phone] fax ([address], [place], [employer], Friday Health Plan, [place], [place], [place], [place], [address], [place])

## 2024-03-25 NOTE — ED Notes (Signed)
D/Cd by provider
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
## 2024-04-01 ENCOUNTER — Encounter (HOSPITAL_BASED_OUTPATIENT_CLINIC_OR_DEPARTMENT_OTHER): Payer: Self-pay | Admitting: Family Medicine

## 2024-04-01 ENCOUNTER — Ambulatory Visit (INDEPENDENT_AMBULATORY_CARE_PROVIDER_SITE_OTHER): Admitting: Family Medicine

## 2024-04-01 VITALS — BP 108/62 | HR 58 | Ht 61.0 in | Wt 132.9 lb

## 2024-04-01 DIAGNOSIS — M26609 Unspecified temporomandibular joint disorder, unspecified side: Secondary | ICD-10-CM | POA: Insufficient documentation

## 2024-04-01 DIAGNOSIS — Z0000 Encounter for general adult medical examination without abnormal findings: Secondary | ICD-10-CM

## 2024-04-01 DIAGNOSIS — E782 Mixed hyperlipidemia: Secondary | ICD-10-CM | POA: Diagnosis not present

## 2024-04-01 DIAGNOSIS — N3281 Overactive bladder: Secondary | ICD-10-CM | POA: Insufficient documentation

## 2024-04-01 DIAGNOSIS — R102 Pelvic and perineal pain: Secondary | ICD-10-CM | POA: Diagnosis not present

## 2024-04-01 LAB — POCT URINALYSIS DIP (CLINITEK)
Bilirubin, UA: NEGATIVE
Glucose, UA: NEGATIVE mg/dL
Ketones, POC UA: NEGATIVE mg/dL
Leukocytes, UA: NEGATIVE
Nitrite, UA: NEGATIVE
POC PROTEIN,UA: NEGATIVE
Spec Grav, UA: <=1.005 — AB (ref 1.010–1.025)
Urobilinogen, UA: 0.2 U/dL
pH, UA: 5.5 (ref 5.0–8.0)

## 2024-04-01 MED ORDER — CYCLOBENZAPRINE HCL 10 MG PO TABS
10.0000 mg | ORAL_TABLET | Freq: Every day | ORAL | 2 refills | Status: DC
Start: 2024-04-01 — End: 2024-09-29

## 2024-04-01 NOTE — Progress Notes (Signed)
 Subjective:   Belinda Mcintosh [birth year]  04/01/2024   CC: Chief Complaint  Patient presents with   Annual Exam    Physical patient having pain in abdomen and bladder is still bothering her. Patient is taking oxybutynin but this is not working patient is still having dizziness    HPI: Belinda Mcintosh is a 43 y.o. female who presents for a routine health maintenance exam.  Labs collected at time of visit.   ABDOMINAL PAIN:  Patient reports suprapubic pain ongoing for the past 2 weeks.  She states that she has been had some mild urgency and frequency of urination.  She has a history of overactive bladder and was previously treated with oxybutynin.  She has stopped taking this medication due to suprapubic pain.  She denies dysuria, fevers or chills, flank pain or nausea or vomiting.   JAW PAIN:  Patient is concerned that her TMJ may be contributing to chronic jaw pain, headaches, dizziness and vision changes.  Patient is currently being worked up by neurology and ENT for possible vestibular issues.  She states that she did receive a mouthguard approximately 1 month ago, but has not been wearing regularly.  She chronically reports clicking popping of the jaw and significant pain.   HEALTH SCREENINGS: - Vision Screening: not applicable - Dental Visits: up to date - Pap smear: up to date - Breast Exam: up to date - STD Screening: Declined - Mammogram (40+): Up to date  - Colonoscopy (45+): Not applicable  - Bone Density (65+ or under 65 with predisposing conditions): Not applicable  - Lung CA screening with low-dose CT:  Not applicable Adults age 27-80 who are current cigarette smokers or quit within the last 15 years. Must have 20 pack year history.   Depression and Anxiety Screen done today and results listed below:     04/01/2024   10:47 AM 01/31/2024   11:04 AM 11/20/2023   11:42 AM 05/17/2022   10:30 AM 08/19/2021   11:57 AM  Depression screen PHQ 2/9  Decreased Interest 0 2 0  0 0  Down, Depressed, Hopeless 3 3 0 0 0  PHQ - 2 Score 3 5 0 0 0  Altered sleeping 3 0   0  Tired, decreased energy 3 0   0  Change in appetite 1 0   0  Feeling bad or failure about yourself  3 0   0  Trouble concentrating 3 1   0  Moving slowly or fidgety/restless 3 0   0  Suicidal thoughts 0 0   0  PHQ-9 Score 19 6   0  Difficult doing work/chores Very difficult Somewhat difficult         04/01/2024   10:48 AM 01/31/2024   11:06 AM 05/17/2022   10:30 AM 08/19/2021   11:57 AM  GAD 7 : Generalized Anxiety Score  Nervous, Anxious, on Edge  0 0 0  Control/stop worrying 1 3 0 0  Worry too much - different things 3 3 0 0  Trouble relaxing 3 1 0 0  Restless 0 0 0 0  Easily annoyed or irritable 3 1 0 0  Afraid - awful might happen 0 1 0 0  Total GAD 7 Score  9 0 0  Anxiety Difficulty Somewhat difficult Not difficult at all      IMMUNIZATIONS: - Tdap: Tetanus vaccination status reviewed: last tetanus booster within 10 years. - HPV: Completing series - Influenza: Postponed to flu season -  Pneumovax: Not applicable - Prevnar 20: Not applicable - Shingrix (50+): Not applicable   Past medical history, surgical history, medications, allergies, family history and social history reviewed with patient today and changes made to appropriate areas of the chart.   Past Medical History:  Diagnosis Date   Abdominal pain    Acute appendicitis 08/20/2023   AMA (advanced maternal age) multigravida 35+ 05/14/2017   Anxiety    Flank pain    Genital warts complicating pregnancy, third trimester 04/30/2017   [facility] HSV neg.     History of COVID-19 01/24/2021   History of kidney stones    Low back pain    Nasal sore 12/18/2023   Palpitations    Pelvic pain 11/20/2023   S/P laparoscopic appendectomy 11/01/2023   Shortness of breath 02/01/2021   Supervision of high risk pregnancy, antepartum 02/08/2021              Nursing Staff    Provider      Office  Location     [id number]    Dating     LMP      Language     English    Anatomy US      WNL      Flu Vaccine          Genetic/Carrier Screen     NIPS:  Low risk Female   AFP:   Negative  Horizon: Negative       TDaP Vaccine      05/20/21    Hgb A1C or   GTT    Early normal 78, 153, 119  Third trimester : normal      COVID Vaccine              LAB RESULTS       SVD (spontaneous vaginal delivery) 08/21/2021   Umbilical hernia 2018    Past Surgical History:  Procedure Laterality Date   APPENDECTOMY     COSMETIC SURGERY     INDUCED ABORTION     LAPAROSCOPIC APPENDECTOMY N/A 08/21/2023   Procedure: APPENDECTOMY LAPAROSCOPIC;  Surgeon: Oralee Billow, MD;  Location: [facility];  Service: General;  Laterality: N/A;   LIPOSUCTION     NOSE SURGERY      Current Outpatient Medications on File Prior to Visit  Medication Sig   escitalopram  (LEXAPRO ) 20 MG tablet Take 1 tablet (20 mg total) by mouth daily.   fluconazole  (DIFLUCAN ) 150 MG tablet Take 1 tablet (150 mg total) by mouth every other day as needed.   oxybutynin (DITROPAN) 5 MG tablet Take 5 mg by mouth 2 (two) times daily.   predniSONE (DELTASONE) 20 MG tablet Take 3 pills for 3 days, 2 pills for 3 days, 1 pill for 3 days, 1/2 pill for 3 days.  Do not take with NSAIDs.  Take all pills for that day in the morning with food   ATORVASTATIN CALCIUM  PO Take by mouth. (Patient not taking: Reported on 01/31/2024)   No current facility-administered medications on file prior to visit.    Allergies  Allergen Reactions   Ivp Dye [Iodinated Contrast Media] Anaphylaxis, Hives and Cough   Shrimp [Shellfish Allergy] Itching   Gabapentin Itching    Depression and itching   Prunus Persica Cough   Rocephin  [Ceftriaxone ] Hives   Tramadol  Anxiety     Social History   Socioeconomic History   Marital status: Divorced    Spouse name: Not on file   Number  of children: 1   Years of education: Not on file   Highest education level: Not on file  Occupational History    Not on file  Tobacco Use   Smoking status: Former    Current packs/day: 0.00    Types: Cigarettes    Quit date: 09/21/2016    Years since quitting: 7.5    Passive exposure: Never   Smokeless tobacco: Never  Vaping Use   Vaping status: Never Used  Substance and Sexual Activity   Alcohol use: No   Drug use: No   Sexual activity: Yes    Birth control/protection: Condom  Other Topics Concern   Not on file  Social History Narrative   ** Merged History Encounter **    Are you right handed or left handed? right   Are you currently employed ?    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   Who lives with you? Kids and sister   What type of home do you live in: 1 story or 2 story? two   Caffeine none    Social Drivers of Corporate investment banker Strain: High Risk (01/31/2024)   Overall Financial Resource Strain (CARDIA)    Difficulty of Paying Living Expenses: Hard  Food Insecurity: Low Risk  (03/27/2024)   Received from [facility]   Hunger Vital Sign    Worried About Running Out of Food in the Last Year: Never true    Ran Out of Food in the Last Year: Never true  Transportation Needs: No Transportation Needs (03/27/2024)   Received from [facility]    In the past 12 months, has lack of reliable transportation kept you from medical appointments, meetings, work or from getting things needed for daily living? : No  Physical Activity: Sufficiently Active (01/31/2024)   Exercise Vital Sign    Days of Exercise per Week: 5 days    Minutes of Exercise per Session: 60 min  Stress: Stress Concern Present (01/31/2024)   [employer] of Occupational Health - Occupational Stress Questionnaire    Feeling of Stress : Rather much  Social Connections: Socially Isolated (01/31/2024)   Social Connection and Isolation Panel [NHANES]    Frequency of Communication with Friends and Family: More than three times a week    Frequency of Social  Gatherings with Friends and Family: Never    Attends Religious Services: Never    Database administrator or Organizations: No    Attends Banker Meetings: Never    Marital Status: Divorced  Catering manager Violence: Not At Risk (08/21/2023)   Humiliation, Afraid, Rape, and Kick questionnaire    Fear of Current or Ex-Partner: No    Emotionally Abused: No    Physically Abused: No    Sexually Abused: No   Social History   Tobacco Use  Smoking Status Former   Current packs/day: 0.00   Types: Cigarettes   Quit date: 09/21/2016   Years since quitting: 7.5   Passive exposure: Never  Smokeless Tobacco Never   Social History   Substance and Sexual Activity  Alcohol Use No    Family History  Problem Relation Age of Onset   Hypertension Mother    Diabetes Mother    Breast cancer Mother        Breast Cancer   Cancer Father    Hypertension Father    Diabetes Father    Cancer Sister  Fibromyalgia Sister    Other Sister        spinal stenosis   Alcohol abuse Neg Hx    Arthritis Neg Hx    Asthma Neg Hx    COPD Neg Hx    Depression Neg Hx    Drug abuse Neg Hx    Early death Neg Hx    Heart disease Neg Hx    Hearing loss Neg Hx    Hyperlipidemia Neg Hx    Kidney disease Neg Hx    Learning disabilities Neg Hx    Mental illness Neg Hx    Mental retardation Neg Hx    Miscarriages / Stillbirths Neg Hx    Stroke Neg Hx    Vision loss Neg Hx    Varicose Veins Neg Hx      ROS: Denies fever, fatigue, unexplained weight loss/gain, chest pain, SHOB, and palpitations. Denies neurological deficits, gastrointestinal or genitourinary complaints, and skin changes.   Objective:   Today's Vitals   04/01/24 1043  BP: 108/62  Pulse: (!) 58  SpO2: 99%  Weight: 132 lb 14.4 oz (60.3 kg)  Height: 5\' 1"  (1.549 m)  PainSc: 5   PainLoc: Abdomen    GENERAL APPEARANCE: Well-appearing, in NAD. Well nourished.  SKIN: Pink, warm and dry. Turgor normal. No rash, lesion,  ulceration, or ecchymoses. Hair evenly distributed.  HEENT: HEAD: Normocephalic.  Mild movement to the right with opening of the jaw palpated on mandible and insertion point.  Mild clicking present bilaterally. EYES: PERRLA. EOMI. Lids intact w/o defect. Sclera white, Conjunctiva pink w/o exudate.  EARS: External ear w/o redness, swelling, masses or lesions. EAC clear. TM's intact, translucent w/o bulging, appropriate landmarks visualized. Appropriate acuity to conversational tones.  NOSE: Septum midline w/o deformity. Nares patent, mucosa pink and non-inflamed w/o drainage. No sinus tenderness.  THROAT: Uvula midline. Oropharynx clear. Tonsils non-inflamed w/o exudate. Oral mucosa pink and moist.  NECK: Supple, Trachea midline. Full ROM w/o pain or tenderness. No lymphadenopathy. Thyroid  non-tender w/o enlargement or palpable masses.  RESPIRATORY: Chest wall symmetrical w/o masses. Respirations even and non-labored. Breath sounds clear to auscultation bilaterally. No wheezes, rales, rhonchi, or crackles. CARDIAC: S1, S2 present, regular rate and rhythm. No gallops, murmurs, rubs, or clicks. PMI w/o lifts, heaves, or thrills. No carotid bruits. Capillary refill <2 seconds. Peripheral pulses 2+ bilaterally. GI: Abdomen soft w/o distention.  Mild suprapubic tenderness.  Normoactive bowel sounds. No palpable masses or tenderness. No guarding or rebound tenderness. Liver and spleen w/o tenderness or enlargement. No CVA tenderness.  MSK: Muscle tone and strength appropriate for age, w/o atrophy or abnormal movement.  EXTREMITIES: Active ROM intact, w/o tenderness, crepitus, or contracture. No obvious joint deformities or effusions. No clubbing, edema, or cyanosis.  NEUROLOGIC: CN's II-XII intact. Motor strength symmetrical with no obvious weakness. No sensory deficits. DTR's 2+ symmetric bilaterally. Steady, even gait.  PSYCH/MENTAL STATUS: Alert, oriented x 3. Cooperative, appropriate mood and affect.    Results for orders placed or performed in visit on 04/01/24  POCT URINALYSIS DIP (CLINITEK)   Collection Time: 04/01/24 11:37 AM  Result Value Ref Range   Color, UA yellow yellow   Clarity, UA clear clear   Glucose, UA negative negative mg/dL   Bilirubin, UA negative negative   Ketones, POC UA negative negative mg/dL   Spec Grav, UA <=1.610 (A) 1.010 - 1.025   Blood, UA trace-intact (A) negative   pH, UA 5.5 5.0 - 8.0   POC PROTEIN,UA negative negative, trace  Urobilinogen, UA 0.2 0.2 or 1.0 E.U./dL   Nitrite, UA Negative Negative   Leukocytes, UA Negative Negative     Assessment & Plan:  1. Annual physical exam (Primary) Patient up-to-date on vaccinations and preventative screenings.  Discussed healthy lifestyle and exercise with patient.  2. Mixed hyperlipidemia Obtain fasting lipid panel as part of annual exam and with history of mixed hyperlipidemia, using statin therapy in the past.  Patient currently not taking statin therapy and monitoring diet and exercise. - Lipid panel  3. Suprapubic pain 5. Overactive bladder Urinalysis negative for signs of infection or significant blood or protein present.  Recommend patient increase clear fluids, restart her oxybutynin, and she [facility].  Recommend if symptoms return or worsen in the next week, return to PCP will consider imaging.  Referral placed. - POCT URINALYSIS DIP (CLINITEK)  4. TMJ (temporomandibular joint syndrome) Recommend patient establish with dental provider and start Flexeril 1 tablet as needed at bedtime for jaw relaxation.  Recommend she obtain a proper fitting nightguard to protect from bruxism.  She should continue to proceed with neurology follow-up and ENT as scheduled. - cyclobenzaprine (FLEXERIL) 10 MG tablet; Take 1 tablet (10 mg total) by mouth at bedtime.  Dispense: 30 tablet; Refill: 2   Orders Placed This Encounter  Procedures   Lipid panel   Ambulatory referral to  Urology    Referral Priority:   Routine    Referral Type:   Consultation    Referral Reason:   Specialty Services Required    Requested Specialty:   Urology    Number of Visits Requested:   1   POCT URINALYSIS DIP (CLINITEK)    PATIENT COUNSELING:  - Encouraged a healthy well-balanced diet. Patient may adjust caloric intake to maintain or achieve ideal body weight. May reduce intake of dietary saturated fat and total fat and have adequate dietary potassium and calcium  preferably from fresh fruits, vegetables, and low-fat dairy products.   - Advised to avoid cigarette smoking. - Discussed with the patient that most people either abstain from alcohol or drink within safe limits (<=14/week and <=4 drinks/occasion for males, <=7/weeks and <= 3 drinks/occasion for females) and that the risk for alcohol disorders and other health effects rises proportionally with the number of drinks per week and how often a drinker exceeds daily limits. - Discussed cessation/primary prevention of drug use and availability of treatment for abuse.  - Discussed sexually transmitted diseases, avoidance of unintended pregnancy and contraceptive alternatives.  - Stressed the importance of regular exercise - Injury prevention: Discussed safety belts, safety helmets, smoke detector, smoking near bedding or upholstery.  - Dental health: Discussed importance of regular tooth brushing, flossing, and dental visits.   NEXT PREVENTATIVE PHYSICAL DUE IN 1 YEAR.  Return in about 1 year (around 04/01/2025) for ANNUAL PHYSICAL.  Patient to reach out to office if new, worrisome, or unresolved symptoms arise or if no improvement in patient's condition. Patient verbalized understanding and is agreeable to treatment plan. All questions answered to patient's satisfaction.    Nonda Bays, [place]

## 2024-04-01 NOTE — Patient Instructions (Addendum)
 Follow up with Neurology as scheduled.   [email or web address]  [facility] Locations: [facility] [address] Johnella Naas [place], [place] [address] [phone]   [facility] [address] [place], [place] [address] [phone]  [facility] [address] Stanley Earls [place], [place] [address] [phone]  [facility] [address] Herschel Lords [place], [place] [address] [phone]  [facility] [address], [place], [place] [address] [phone]   [facility] Location: [facility] [address], [address] [place], [place] [address] Details: This 45-chair [facility] is open to all patients; no pre-registration is required. Doors open at 6:00 a.m.

## 2024-04-02 ENCOUNTER — Ambulatory Visit (HOSPITAL_BASED_OUTPATIENT_CLINIC_OR_DEPARTMENT_OTHER): Payer: Self-pay | Admitting: Family Medicine

## 2024-04-02 LAB — LIPID PANEL
Chol/HDL Ratio: 3.5 ratio (ref 0.0–4.4)
Cholesterol, Total: 164 mg/dL (ref 100–199)
HDL: 47 mg/dL (ref >39)
LDL Chol Calc (NIH): 102 mg/dL — ABNORMAL HIGH (ref 0–99)
Triglycerides: 80 mg/dL (ref 0–149)
VLDL Cholesterol Cal: 15 mg/dL (ref 5–40)

## 2024-04-02 NOTE — Progress Notes (Signed)
 Pepper,  Your cholesterol has increased slightly since last year and with stopping the cholesterol medication. You can continue with dietary changes and regular exercise to keep this controlled currently. It is not at a level requiring cholesterol medicine. We can continue to monitor this yearly.

## 2024-04-15 ENCOUNTER — Encounter: Payer: Self-pay | Admitting: Psychology

## 2024-04-15 NOTE — Telephone Encounter (Signed)
 Please see message sent by pt and advise.

## 2024-04-21 ENCOUNTER — Ambulatory Visit (HOSPITAL_BASED_OUTPATIENT_CLINIC_OR_DEPARTMENT_OTHER): Admitting: Certified Nurse Midwife

## 2024-04-21 ENCOUNTER — Ambulatory Visit (HOSPITAL_BASED_OUTPATIENT_CLINIC_OR_DEPARTMENT_OTHER)

## 2024-04-21 VITALS — BP 109/70 | HR 64 | Ht 61.0 in | Wt 138.0 lb

## 2024-04-21 DIAGNOSIS — R1310 Dysphagia, unspecified: Secondary | ICD-10-CM

## 2024-04-21 DIAGNOSIS — N393 Stress incontinence (female) (male): Secondary | ICD-10-CM | POA: Diagnosis not present

## 2024-04-21 DIAGNOSIS — Z3202 Encounter for pregnancy test, result negative: Secondary | ICD-10-CM | POA: Diagnosis not present

## 2024-04-21 DIAGNOSIS — R102 Pelvic and perineal pain: Secondary | ICD-10-CM | POA: Diagnosis not present

## 2024-04-21 LAB — POCT URINE PREGNANCY: Preg Test, Ur: NEGATIVE

## 2024-04-21 NOTE — Progress Notes (Unsigned)
  Pt states that she started running approx one month ago and has started to notice urinary leakage (stress incontinence) while running and coughing. She has a history of 2 prior vaginal deliveries. Her youngest child is 2. Birth weights ~7lb.  Pt reports an occasional pelvic pain. States there is no risk of pregnancy and no risk of STD.    Urine Culture Referral to Dr. Frutoso Jing (Stress Incontinence) US  to rule out ovarian abnormalities

## 2024-04-21 NOTE — Progress Notes (Signed)
 NURSE VISIT- INJECTION  SUBJECTIVE:  Belinda Mcintosh is a 43 y.o. [phone] female here for a Gardasil for hpv prevention. She is a GYN patient.   OBJECTIVE:  BP 109/70 (BP Location: Left Arm, Patient Position: Sitting, Cuff Size: Normal)   Pulse 64   Ht 5\' 1"  (1.549 m)   Wt 138 lb (62.6 kg)   LMP 03/28/2024 (Approximate)   BMI 26.07 kg/m   Appears well, in no apparent distress  Injection administered in: Right deltoid  No orders of the defined types were placed in this encounter.   ASSESSMENT: GYN patient Gardasil for hpv prevention. PLAN: Follow-up: as scheduled

## 2024-04-22 DIAGNOSIS — N393 Stress incontinence (female) (male): Secondary | ICD-10-CM | POA: Insufficient documentation

## 2024-04-22 LAB — URINE CULTURE

## 2024-04-23 ENCOUNTER — Ambulatory Visit (HOSPITAL_BASED_OUTPATIENT_CLINIC_OR_DEPARTMENT_OTHER): Payer: Self-pay | Admitting: Certified Nurse Midwife

## 2024-05-12 ENCOUNTER — Encounter: Payer: Self-pay | Admitting: Psychology

## 2024-05-12 ENCOUNTER — Encounter: Attending: Psychology | Admitting: Psychology

## 2024-05-12 DIAGNOSIS — F4320 Adjustment disorder, unspecified: Secondary | ICD-10-CM | POA: Diagnosis not present

## 2024-05-12 DIAGNOSIS — R4189 Other symptoms and signs involving cognitive functions and awareness: Secondary | ICD-10-CM | POA: Insufficient documentation
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
## 2024-05-20 ENCOUNTER — Telehealth: Payer: Self-pay | Admitting: *Deleted

## 2024-05-20 NOTE — Telephone Encounter (Signed)
 Patient left a message stating Dr Tina has her taking iron pills. States she has been having diarrhea, my blood pressure is low and my heart beating. I feel like I'm going to pass out.   Called patient back using an interpreter. States she took iron tablets in February and was started on Lexapro . Developed esophagitis and stopped all meds. Has started back on the iron in the past 2 days. States she had diarrhea today after taking iron and felt like she was going to pass out this morning. States BP was ~ 90/60. Is feeling better at present time. Instructed to stop taking oral iron. She says this is the only pill she is currently taking and she thinks it is what is causing her problems. Instructed to go to the ED if symptoms resume. To call on Friday to let us  know how she feeling after stopping the iron. Pt also says she saw audiologist yesterday and psychologist on Monday for dizziness.

## 2024-06-02 ENCOUNTER — Other Ambulatory Visit (INDEPENDENT_AMBULATORY_CARE_PROVIDER_SITE_OTHER)

## 2024-06-02 ENCOUNTER — Ambulatory Visit (INDEPENDENT_AMBULATORY_CARE_PROVIDER_SITE_OTHER): Admitting: Gastroenterology

## 2024-06-02 ENCOUNTER — Encounter: Payer: Self-pay | Admitting: Gastroenterology

## 2024-06-02 VITALS — BP 108/70 | HR 72 | Ht 61.0 in | Wt 128.2 lb

## 2024-06-02 DIAGNOSIS — R079 Chest pain, unspecified: Secondary | ICD-10-CM | POA: Diagnosis not present

## 2024-06-02 DIAGNOSIS — R002 Palpitations: Secondary | ICD-10-CM

## 2024-06-02 DIAGNOSIS — R1013 Epigastric pain: Secondary | ICD-10-CM

## 2024-06-02 DIAGNOSIS — R1310 Dysphagia, unspecified: Secondary | ICD-10-CM

## 2024-06-02 DIAGNOSIS — R1319 Other dysphagia: Secondary | ICD-10-CM

## 2024-06-02 DIAGNOSIS — R195 Other fecal abnormalities: Secondary | ICD-10-CM

## 2024-06-02 DIAGNOSIS — R42 Dizziness and giddiness: Secondary | ICD-10-CM

## 2024-06-02 LAB — COMPREHENSIVE METABOLIC PANEL WITH GFR
ALT: 41 U/L — ABNORMAL HIGH (ref 0–35)
AST: 21 U/L (ref 0–37)
Albumin: 4.6 g/dL (ref 3.5–5.2)
Alkaline Phosphatase: 57 U/L (ref 39–117)
BUN: 14 mg/dL (ref 6–23)
CO2: 24 meq/L (ref 19–32)
Calcium: 9.1 mg/dL (ref 8.4–10.5)
Chloride: 104 meq/L (ref 96–112)
Creatinine, Ser: 0.62 mg/dL (ref 0.40–1.20)
GFR: 109.61 mL/min (ref >60.00)
Glucose, Bld: 87 mg/dL (ref 70–99)
Potassium: 4.1 meq/L (ref 3.5–5.1)
Sodium: 136 meq/L (ref 135–145)
Total Bilirubin: 0.6 mg/dL (ref 0.2–1.2)
Total Protein: 7.3 g/dL (ref 6.0–8.3)

## 2024-06-02 LAB — CBC WITH DIFFERENTIAL/PLATELET
Basophils Absolute: 0 K/uL (ref 0.0–0.1)
Basophils Relative: 0.3 % (ref 0.0–3.0)
Eosinophils Absolute: 0.1 K/uL (ref 0.0–0.7)
Eosinophils Relative: 0.9 % (ref 0.0–5.0)
HCT: 42.5 % (ref 36.0–46.0)
Hemoglobin: 14.1 g/dL (ref 12.0–15.0)
Lymphocytes Relative: 24.3 % (ref 12.0–46.0)
Lymphs Abs: 1.8 K/uL (ref 0.7–4.0)
MCHC: 33 g/dL (ref 30.0–36.0)
MCV: 90.4 fl (ref 78.0–100.0)
Monocytes Absolute: 0.3 K/uL (ref 0.1–1.0)
Monocytes Relative: 4.3 % (ref 3.0–12.0)
Neutro Abs: 5.3 K/uL (ref 1.4–7.7)
Neutrophils Relative %: 70.2 % (ref 43.0–77.0)
Platelets: 128 K/uL — ABNORMAL LOW (ref 150.0–400.0)
RBC: 4.71 Mil/uL (ref 3.87–5.11)
RDW: 13.9 % (ref 11.5–15.5)
WBC: 7.6 K/uL (ref 4.0–10.5)

## 2024-06-02 LAB — IBC + FERRITIN
Ferritin: 23.8 ng/mL (ref 10.0–291.0)
Iron: 142 ug/dL (ref 42–145)
Saturation Ratios: 37.7 % (ref 20.0–50.0)
TIBC: 376.6 ug/dL (ref 250.0–450.0)
Transferrin: 269 mg/dL (ref 212.0–360.0)

## 2024-06-02 LAB — VITAMIN D 25 HYDROXY (VIT D DEFICIENCY, FRACTURES): VITD: 28.65 ng/mL — ABNORMAL LOW (ref 30.00–100.00)

## 2024-06-02 LAB — TSH: TSH: 0.56 u[IU]/mL (ref 0.35–5.50)

## 2024-06-02 LAB — LIPASE: Lipase: 15 U/L (ref 11.0–59.0)

## 2024-06-02 MED ORDER — PANTOPRAZOLE SODIUM 40 MG PO TBEC: 40.0000 mg | DELAYED_RELEASE_TABLET | Freq: Every day | ORAL | 3 refills | Status: DC

## 2024-06-02 NOTE — Patient Instructions (Addendum)
 Your provider has requested that you go to the basement level for lab work before leaving today. Press B on the elevator. The lab is located at the first door on the left as you exit the elevator.   We placed a referral to cardiology and their office will call you to schedule an appointment. If you havent heard from them within 14 business days please call their office.   [facility] [address] 5th Floor [place],  [place]  [address] [phone]  Start Protonix  daily AFTER completing stool study.  You have been scheduled for a Barium Esophogram at [facility] (1st floor of the hospital) on 06/18/24 at 10 am. Please arrive 30 minutes prior to your appointment for registration. Make certain not to have anything to eat or drink 3 hours prior to your test. If you need to reschedule for any reason, please contact radiology at [phone] to do so. __________________________________________________________________ A barium swallow is an examination that concentrates on views of the esophagus. This tends to be a double contrast exam (barium and two liquids which, when combined, create a gas to distend the wall of the oesophagus) or single contrast (non-ionic iodine based). The study is usually tailored to your symptoms so a good history is essential. Attention is paid during the study to the form, structure and configuration of the esophagus, looking for functional disorders (such as aspiration, dysphagia, achalasia, motility and reflux) EXAMINATION You may be asked to change into a gown, depending on the type of swallow being performed. A radiologist and radiographer will perform the procedure. The radiologist will advise you of the type of contrast selected for your procedure and direct you during the exam. You will be asked to stand, sit or lie in several different positions and to hold a small amount of fluid in your mouth before being asked to swallow while  the imaging is performed .In some instances you may be asked to swallow barium coated marshmallows to assess the motility of a solid food bolus. The exam can be recorded as a digital or video fluoroscopy procedure. POST PROCEDURE It will take 1-2 days for the barium to pass through your system. To facilitate this, it is important, unless otherwise directed, to increase your fluids for the next 24-48hrs and to resume your normal diet.  This test typically takes about 30 minutes to perform. __________________________________________________________________________________   _______________________________________________________  If your blood pressure at your visit was 140/90 or greater, please contact your primary care physician to follow up on this.  _______________________________________________________  If you are age 94 or older, your body mass index should be between 23-30. Your Body mass index is 24.23 kg/m. If this is out of the aforementioned range listed, please consider follow up with your Primary Care Provider.  If you are age 82 or younger, your body mass index should be between 19-25. Your Body mass index is 24.23 kg/m. If this is out of the aformentioned range listed, please consider follow up with your Primary Care Provider.   ________________________________________________________  The [facility] providers would like to encourage you to use MYCHART to communicate with providers for non-urgent requests or questions.  Due to long hold times on the telephone, sending your provider a message by [facility] may be a faster and more efficient way to get a response.  Please allow 48 business hours for a response.  Please remember that this is for non-urgent requests.  _______________________________________________________

## 2024-06-02 NOTE — Progress Notes (Signed)
 Belinda Mcintosh [id number] December 14, 1980   Chief Complaint: Difficulty swallowing, abdominal pain  Referring Provider: Knute Thersia Bitters, * Primary GI MD: Sampson  HPI: Belinda Mcintosh is a 43 y.o. female with past medical history of anxiety, kidney stones, appendectomy, umbilical hernia who presents today for a complaint of difficulty swallowing and abdominal pain.    Labs 01/30/2024: Platelets 142, otherwise normal CBC, normal CMP, normal TSH, normal vitamin B12  Patient seen in ED 03/25/2024 reporting dizziness, thought to be psychological per patient.  History of MRI brain 12/03/2023 which showed lesions, lumbar puncture negative.  In ED patient reported dizziness started around October 2024.  Multiple personal stressors at that time, also had flushing of her ear canal which cause dizziness.  Has completed 3 weeks of vestibular therapy.  Was prescribed Lexapro  10 mg daily for anxiety, only took medication for a few months and stopped 3 weeks prior to ED visit.  04/01/2024 had visit with PCP and reported jaw pain, noted clicking/popping of the jaw and significant pain.  Has mouthguard but not wearing regularly.  Neuropsych evaluation 05/12/2024.  Somatic complaints and dizziness seen as likely secondary to significant and chronic stress.  See note for more details.  She has been referred to [place].  05/20/2024 seen by [facility].  Endorsed dizziness and palpitations at that time.  Started on fluoxetine/Prozac.   Patient's primary language is Spanish and an interpreter is present for the visit today.  Patient states she has been having intermittent difficulty swallowing ongoing for about 3 months.  States this started when she began taking Lexapro .  She began having intermittent feelings of food getting stuck in her chest as well as epigastric abdominal pain and decreased appetite.  States she spoke to her neurologist in [place] and was  advised to stop the medications, after which time her symptoms did start getting better but have not completely resolved.  She has also noticed increased belching.  Occasionally will have epigastric pain with radiation upwards into her chest.  She denies acid reflux.  She reports that while in [place] her husband was found to have H. pylori and was treated for this.  Patient was not tested or treated. (Note that patient and husband going through divorce).  States she was recently started on fluoxetine and started having the same symptoms she had on Lexapro  with difficulty swallowing and epigastric pain.  States it seems like she does not tolerate any medications well and often has stomach pain with new medications.  She reports history of loose stools which seems to be worse on an iron supplement.  She denies any blood in her stool or melena.  Is having 3-4 bowel movements daily when previously she would have 1-2 bowel movements daily.  Stools can be loose or watery.  She denies any associated abdominal or rectal pain.  States that all of her problems seem to be in her upper abdomen.  She follows with hematology due to thrombocytopenia.  States she also has vitamin D  deficiency and was advised to start a supplement but has not done so yet.  She is interested in discussing iron infusions as she feels she does not tolerate oral iron well.  Patient denies nausea, vomiting, fever, chills, unintentional weight loss.  She has been having continued problems with dizziness which she does say seems to be better when she takes iron.  States her husband drove her here due to dizziness.  Also having  intermittent palpitations and epigastric/chest pain.  Though this has been thought due to her anxiety, patient states she has not been evaluated by cardiology and she would like a referral for this.  Reports she has been having increased frequency of urination but denies painful urination.  Advised she reach out to  her PCP for further evaluation of this.  Previous GI Procedures/Imaging   CT A/P 12/30/2023 1. No renal, ureteral or bladder calculi. 2. Mild bladder wall thickening could be due to lack of distension but could not exclude cystitis. Recommend correlation with urinalysis. 3. No other significant abdominal/pelvic findings, mass lesions or adenopathy.  Past Medical History:  Diagnosis Date   Abdominal pain    Acute appendicitis 08/20/2023   AMA (advanced maternal age) multigravida 35+ 05/14/2017   Anxiety    Flank pain    Genital warts complicating pregnancy, third trimester 04/30/2017   [facility] HSV neg.     History of COVID-19 01/24/2021   History of kidney stones    Low back pain    Nasal sore 12/18/2023   Palpitations    Pelvic pain 11/20/2023   S/P laparoscopic appendectomy 11/01/2023   Shortness of breath 02/01/2021   Supervision of high risk pregnancy, antepartum 02/08/2021              Nursing Staff    Provider      Office Location     [id number]    Dating     LMP      Language     English    Anatomy US      WNL      Flu Vaccine          Genetic/Carrier Screen     NIPS:  Low risk Female   AFP:   Negative  Horizon: Negative       TDaP Vaccine      05/20/21    Hgb A1C or   GTT    Early normal 78, 153, 119  Third trimester : normal      COVID Vaccine              LAB RESULTS       SVD (spontaneous vaginal delivery) 08/21/2021   Umbilical hernia 2018    Past Surgical History:  Procedure Laterality Date   APPENDECTOMY     COSMETIC SURGERY     INDUCED ABORTION     LAPAROSCOPIC APPENDECTOMY N/A 08/21/2023   Procedure: APPENDECTOMY LAPAROSCOPIC;  Surgeon: Eletha Boas, MD;  Location: [facility];  Service: General;  Laterality: N/A;   LIPOSUCTION     NOSE SURGERY      Current Outpatient Medications  Medication Sig Dispense Refill   ATORVASTATIN CALCIUM  PO Take by mouth. (Patient not taking: Reported on 04/21/2024)     cyclobenzaprine  (FLEXERIL ) 10 MG  tablet Take 1 tablet (10 mg total) by mouth at bedtime. 30 tablet 2   escitalopram  (LEXAPRO ) 20 MG tablet Take 1 tablet (20 mg total) by mouth daily. 30 tablet 0   fluconazole  (DIFLUCAN ) 150 MG tablet Take 1 tablet (150 mg total) by mouth every other day as needed. 2 tablet 2   oxybutynin (DITROPAN) 5 MG tablet Take 5 mg by mouth 2 (two) times daily.     predniSONE (DELTASONE) 20 MG tablet Take 3 pills for 3 days, 2 pills for 3 days, 1 pill for 3 days, 1/2 pill for 3 days.  Do not take with NSAIDs.  Take all pills  for that day in the morning with food (Patient not taking: Reported on 04/21/2024)     No current facility-administered medications for this visit.    Allergies as of 06/02/2024 - Review Complete 05/12/2024  Allergen Reaction Noted   Ivp dye [iodinated contrast media] Anaphylaxis, Hives, and Cough 08/20/2023   Shrimp [shellfish allergy] Itching 11/21/2016   Gabapentin Itching 11/16/2023   Prunus persica Cough 01/30/2024   Rocephin  [ceftriaxone ] Hives 08/20/2023   Tramadol  Anxiety 10/18/2023    Family History  Problem Relation Age of Onset   Hypertension Mother    Diabetes Mother    Breast cancer Mother        Breast Cancer   Cancer Father    Hypertension Father    Diabetes Father    Cancer Sister    Fibromyalgia Sister    Other Sister        spinal stenosis   Alcohol abuse Neg Hx    Arthritis Neg Hx    Asthma Neg Hx    COPD Neg Hx    Depression Neg Hx    Drug abuse Neg Hx    Early death Neg Hx    Heart disease Neg Hx    Hearing loss Neg Hx    Hyperlipidemia Neg Hx    Kidney disease Neg Hx    Learning disabilities Neg Hx    Mental illness Neg Hx    Mental retardation Neg Hx    Miscarriages / Stillbirths Neg Hx    Stroke Neg Hx    Vision loss Neg Hx    Varicose Veins Neg Hx     Social History   Tobacco Use   Smoking status: Former    Current packs/day: 0.00    Types: Cigarettes    Quit date: 09/21/2016    Years since quitting: 7.7    Passive exposure:  Never   Smokeless tobacco: Never  Vaping Use   Vaping status: Never Used  Substance Use Topics   Alcohol use: No   Drug use: No     Review of Systems:    Constitutional: No unexplained weight loss, fever, chills, weakness or fatigue Skin: No rash or itching Cardiovascular: Positive for chest pain and palpitations, though not present at time of visit  Respiratory: No SOB or cough Gastrointestinal: See HPI and otherwise negative Genitourinary: Increased frequency of urination, no dysuria Neurological: Positive dizziness, no syncope Musculoskeletal: No new muscle or joint pain Hematologic: No bleeding or bruising    Physical Exam:  Vital signs: BP 108/70 (BP Location: Left Arm, Patient Position: Sitting, Cuff Size: Normal)   Pulse 72   Ht 5' 1 (1.549 m)   Wt 128 lb 4 oz (58.2 kg)   BMI 24.23 kg/m    Constitutional: Pleasant female in NAD, alert and cooperative Head:  Normocephalic and atraumatic.  Eyes: No scleral icterus.  Mouth: No oral lesions. Respiratory: Respirations even and unlabored. Lungs clear to auscultation bilaterally.  No wheezes, crackles, or rhonchi.  Cardiovascular:  Regular rate and rhythm. No murmurs. No peripheral edema. Gastrointestinal:  Soft, nondistended, nontender. No rebound or guarding. Normal bowel sounds. No appreciable masses or hepatomegaly. Rectal:  Not performed.  Neurologic:  Alert and oriented x4;  grossly normal neurologically.  Skin:   Dry and intact without significant lesions or rashes. Psychiatric: Oriented to person, place and time. Demonstrates good judgement and reason without abnormal affect or behaviors.   RELEVANT LABS AND IMAGING: CBC    Component Value Date/Time   WBC 7.1  12/30/2023 1647   RBC 4.75 12/30/2023 1647   HGB 14.0 12/30/2023 1647   HGB 11.6 05/20/2021 0841   HCT 41.9 12/30/2023 1647   HCT 35.1 05/20/2021 0841   PLT 138 (L) 12/30/2023 1647   PLT 131 (L) 05/20/2021 0841   MCV 88.2 12/30/2023 1647   MCV 87  05/20/2021 0841   MCH 29.5 12/30/2023 1647   MCHC 33.4 12/30/2023 1647   RDW 13.1 12/30/2023 1647   RDW 12.9 05/20/2021 0841   LYMPHSABS 1.9 12/30/2023 1647   LYMPHSABS 1.6 02/22/2021 0943   MONOABS 0.3 12/30/2023 1647   EOSABS 0.0 12/30/2023 1647   EOSABS 0.1 02/22/2021 0943   BASOSABS 0.0 12/30/2023 1647   BASOSABS 0.0 02/22/2021 0943    CMP     Component Value Date/Time   NA 137 12/30/2023 1647   NA 137 11/18/2020 1702   K 3.9 12/30/2023 1647   CL 105 12/30/2023 1647   CO2 23 12/30/2023 1647   GLUCOSE 95 12/30/2023 1647   BUN 8 12/30/2023 1647   BUN 13 11/18/2020 1702   CREATININE 0.72 12/30/2023 1647   CALCIUM  9.2 12/30/2023 1647   PROT 7.3 12/30/2023 1647   PROT 7.8 11/18/2020 1702   ALBUMIN 3.9 12/30/2023 1647   ALBUMIN 4.2 12/24/2023 1408   AST 14 (L) 12/30/2023 1647   ALT 21 12/30/2023 1647   ALKPHOS 52 12/30/2023 1647   BILITOT 0.3 12/30/2023 1647   BILITOT <0.2 11/18/2020 1702   GFRNONAA >60 12/30/2023 1647   GFRAA 122 11/18/2020 1702     Assessment/Plan:   Epigastric pain Esophageal dysphagia Chest pain Dizziness Palpitations Patient reports about 3 months of intermittent trouble swallowing and epigastric pain.  States that symptoms began after she started Lexapro  and improved when she discontinued the medication though symptoms did persist.  Noticed that symptoms again worsened after starting fluoxetine.  She will intermittently feel food gets stuck in her chest and has epigastric pain as well as chest pain. Reports that her husband was found to be positive for H. pylori while they were in [place], and he was treated but she was never tested or treated. She denies melena or rectal bleeding.  Denies acid reflux, nausea, vomiting, fever, chills.  Has had increased belching.  We discussed options for further workup to include upper endoscopy, however she would like to hold off on this given her current problems with dizziness and palpitations, and I agree  that she should have a cardiac evaluation prior to scheduling any procedures. Will send referral today. She requests to have iron and vitamin D  levels rechecked today.  - Check labs: CBC, CMP, lipase, TSH, TTG, IgA, iron/ferritin, Vit D - Check H. Pylori stool - Order barium swallow - Start Protonix  40 mg after submitting stool H. pylori - Send referral to cardiology for further evaluation of dizziness, chest pain, palpitations - Will likely need EGD in future - Advised patient to discuss with her hematologist the option of iron infusions as she appears to have trouble tolerating oral iron  Loose stools Patient reports history of stomach problems and loose stools, which seems to have worsened recently on iron. Has also had a change in medication for anxiety. She denies any blood in her stool or melena and denies any associated abdominal pain.  Possible medication side effect but will evaluate for infection or inflammatory process.  - Check C. Diff, stool culture, fecal calprotectin - If symptoms worsen consider colonoscopy   Camie Furbish, PA-C [facility] 06/02/2024, 8:07  AM  Patient Care Team: Caudle, Thersia Bitters, FNP as PCP - General (Family Medicine)

## 2024-06-04 NOTE — Progress Notes (Signed)
 Attending Physician's Attestation   I have reviewed the chart.   I agree with the Advanced Practitioner's note, impression, and recommendations with any updates as below. Reasonable approach based on patient other constitutional symptoms that are occurring.  If barium swallow is concerning, or urgent endoscopy will need to be considered.   Aloha Finner, MD [facility] Advanced Endoscopy Office # [phone]

## 2024-06-09 ENCOUNTER — Ambulatory Visit: Payer: Self-pay | Admitting: Gastroenterology

## 2024-06-11 ENCOUNTER — Institutional Professional Consult (permissible substitution) (INDEPENDENT_AMBULATORY_CARE_PROVIDER_SITE_OTHER): Admitting: Otolaryngology

## 2024-06-17 ENCOUNTER — Other Ambulatory Visit (HOSPITAL_COMMUNITY)
Admission: RE | Admit: 2024-06-17 | Discharge: 2024-06-17 | Disposition: A | Discharge status: Home / Self Care | Source: Ambulatory Visit | Attending: Certified Nurse Midwife | Admitting: Certified Nurse Midwife

## 2024-06-17 ENCOUNTER — Ambulatory Visit (INDEPENDENT_AMBULATORY_CARE_PROVIDER_SITE_OTHER): Admitting: Certified Nurse Midwife

## 2024-06-17 ENCOUNTER — Encounter (HOSPITAL_BASED_OUTPATIENT_CLINIC_OR_DEPARTMENT_OTHER): Payer: Self-pay | Admitting: Certified Nurse Midwife

## 2024-06-17 VITALS — BP 104/61 | HR 65 | Ht 61.0 in | Wt 139.0 lb

## 2024-06-17 DIAGNOSIS — R109 Unspecified abdominal pain: Secondary | ICD-10-CM | POA: Diagnosis not present

## 2024-06-17 DIAGNOSIS — Z113 Encounter for screening for infections with a predominantly sexual mode of transmission: Secondary | ICD-10-CM | POA: Insufficient documentation

## 2024-06-17 DIAGNOSIS — N9089 Other specified noninflammatory disorders of vulva and perineum: Secondary | ICD-10-CM

## 2024-06-17 DIAGNOSIS — B379 Candidiasis, unspecified: Secondary | ICD-10-CM

## 2024-06-17 LAB — POC URINALSYSI DIPSTICK (AUTOMATED)
Bilirubin, UA: NEGATIVE
Blood, UA: NEGATIVE
Glucose, UA: NEGATIVE
Ketones, UA: NEGATIVE
Leukocytes, UA: NEGATIVE
Nitrite, UA: NEGATIVE
Protein, UA: NEGATIVE
Spec Grav, UA: <=1.005 — AB (ref 1.010–1.025)
Urobilinogen, UA: 0.2 U/dL
pH, UA: 5.5 (ref 5.0–8.0)

## 2024-06-17 MED ORDER — FLUCONAZOLE 150 MG PO TABS: 150.0000 mg | ORAL_TABLET | ORAL | 2 refills | Status: DC | PRN

## 2024-06-17 NOTE — Progress Notes (Unsigned)
  Subjective:     Belinda Mcintosh is a 43 y.o. female here for problem gyn visit. Pt has noticed a small single solitary lesion (mm) on right labia. Pt states it is uncomfortable at that area. She is sexually active with ex-husband and he does have Hx Genital Warts, but he wears a condom.    The following portions of the patient's history were reviewed and updated as appropriate: allergies, current medications, past family history, past medical history, past social history, past surgical history, and problem list.   Review of Systems Pertinent items are noted in HPI.    Objective:    BP 104/61   Pulse 65   Ht 5' 1 (1.549 m)   Wt 139 lb (63 kg)   SpO2 98%   BMI 26.26 kg/m  General appearance: alert, cooperative, appears stated age, and no distress Abdomen: soft, non-tender; bowel sounds normal; no masses,  no organomegaly Pelvic: cervix normal in appearance, external genitalia normal, no adnexal masses or tenderness, no cervical motion tenderness, rectovaginal septum normal, uterus normal size, shape, and consistency, vagina normal without discharge, and tiny spot/lesion right labia that almost appears like a tiny laceration.  Pt appears to possibly have external candidiasis with some erythema noted bilaterally. No genital warts.   Assessment:    Vulvar lesion.    Plan:    Pt desires   to be screened for STI and desires serum screening HSV1 and HSV2 Diflucan  150mg  po today and repeat if needed in 48 hours.  Arland MARLA Roller

## 2024-06-18 ENCOUNTER — Inpatient Hospital Stay (HOSPITAL_COMMUNITY): Admission: RE | Admit: 2024-06-18 | Source: Ambulatory Visit

## 2024-06-18 LAB — CERVICOVAGINAL ANCILLARY ONLY
Chlamydia: NEGATIVE
Comment: NEGATIVE
Comment: NEGATIVE
Comment: NORMAL
Neisseria Gonorrhea: NEGATIVE
Trichomonas: NEGATIVE

## 2024-06-18 LAB — HEPATITIS C ANTIBODY: Hep C Virus Ab: NONREACTIVE

## 2024-06-18 LAB — RPR: RPR Ser Ql: NONREACTIVE

## 2024-06-18 LAB — HSV 1 AND 2 AB, IGG
HSV 1 Glycoprotein G Ab, IgG: REACTIVE — AB
HSV 2 IgG, Type Spec: NONREACTIVE

## 2024-06-18 LAB — HEPATITIS B SURFACE ANTIGEN: Hepatitis B Surface Ag: NEGATIVE

## 2024-06-18 LAB — HIV ANTIBODY (ROUTINE TESTING W REFLEX): HIV Screen 4th Generation wRfx: NONREACTIVE

## 2024-06-19 ENCOUNTER — Other Ambulatory Visit

## 2024-06-19 DIAGNOSIS — R1319 Other dysphagia: Secondary | ICD-10-CM

## 2024-06-19 DIAGNOSIS — R1013 Epigastric pain: Secondary | ICD-10-CM

## 2024-06-19 LAB — URINE CULTURE

## 2024-06-20 ENCOUNTER — Ambulatory Visit (HOSPITAL_BASED_OUTPATIENT_CLINIC_OR_DEPARTMENT_OTHER): Payer: Self-pay | Admitting: Certified Nurse Midwife

## 2024-06-20 ENCOUNTER — Other Ambulatory Visit (HOSPITAL_BASED_OUTPATIENT_CLINIC_OR_DEPARTMENT_OTHER): Payer: Self-pay | Admitting: Certified Nurse Midwife

## 2024-06-20 DIAGNOSIS — B379 Candidiasis, unspecified: Secondary | ICD-10-CM | POA: Insufficient documentation

## 2024-06-20 DIAGNOSIS — B9689 Other specified bacterial agents as the cause of diseases classified elsewhere: Secondary | ICD-10-CM

## 2024-06-20 LAB — CLOSTRIDIUM DIFFICILE BY PCR: Toxigenic C. Difficile by PCR: NEGATIVE

## 2024-06-20 LAB — SPECIMEN STATUS REPORT

## 2024-06-20 MED ORDER — FLUCONAZOLE 150 MG PO TABS: 150.0000 mg | ORAL_TABLET | ORAL | 2 refills | Status: DC | PRN

## 2024-06-20 MED ORDER — VALACYCLOVIR HCL 1 G PO TABS: 1000.0000 mg | ORAL_TABLET | Freq: Two times a day (BID) | ORAL | 0 refills | Status: DC

## 2024-06-21 LAB — H. PYLORI ANTIGEN, STOOL: H pylori Ag, Stl: POSITIVE — AB

## 2024-06-21 LAB — CALPROTECTIN, FECAL: Calprotectin, Fecal: 20 ug/g (ref 0–120)

## 2024-06-23 ENCOUNTER — Inpatient Hospital Stay: Payer: Self-pay | Attending: Internal Medicine

## 2024-06-23 ENCOUNTER — Ambulatory Visit: Payer: Self-pay

## 2024-06-23 DIAGNOSIS — R790 Abnormal level of blood mineral: Secondary | ICD-10-CM

## 2024-06-23 DIAGNOSIS — Z79899 Other long term (current) drug therapy: Secondary | ICD-10-CM | POA: Diagnosis not present

## 2024-06-23 DIAGNOSIS — D696 Thrombocytopenia, unspecified: Secondary | ICD-10-CM | POA: Insufficient documentation

## 2024-06-23 DIAGNOSIS — R002 Palpitations: Secondary | ICD-10-CM | POA: Insufficient documentation

## 2024-06-23 DIAGNOSIS — K30 Functional dyspepsia: Secondary | ICD-10-CM | POA: Insufficient documentation

## 2024-06-23 LAB — CBC WITH DIFFERENTIAL (CANCER CENTER ONLY)
Abs Immature Granulocytes: 0.01 K/uL (ref 0.00–0.07)
Basophils Absolute: 0 K/uL (ref 0.0–0.1)
Basophils Relative: 1 %
Eosinophils Absolute: 0.1 K/uL (ref 0.0–0.5)
Eosinophils Relative: 1 %
HCT: 41.4 % (ref 36.0–46.0)
Hemoglobin: 13.8 g/dL (ref 12.0–15.0)
Immature Granulocytes: 0 %
Lymphocytes Relative: 31 %
Lymphs Abs: 1.7 K/uL (ref 0.7–4.0)
MCH: 29.5 pg (ref 26.0–34.0)
MCHC: 33.3 g/dL (ref 30.0–36.0)
MCV: 88.5 fL (ref 80.0–100.0)
Monocytes Absolute: 0.2 K/uL (ref 0.1–1.0)
Monocytes Relative: 4 %
Neutro Abs: 3.5 K/uL (ref 1.7–7.7)
Neutrophils Relative %: 63 %
Platelet Count: 114 K/uL — ABNORMAL LOW (ref 150–400)
RBC: 4.68 MIL/uL (ref 3.87–5.11)
RDW: 12.4 % (ref 11.5–15.5)
WBC Count: 5.5 K/uL (ref 4.0–10.5)
nRBC: 0 % (ref 0.0–0.2)

## 2024-06-23 LAB — STOOL CULTURE: E coli, Shiga toxin Assay: NEGATIVE

## 2024-06-23 LAB — FERRITIN: Ferritin: 45 ng/mL (ref 11–307)

## 2024-06-24 ENCOUNTER — Other Ambulatory Visit: Payer: Self-pay | Admitting: Gastroenterology

## 2024-06-24 MED ORDER — BISMUTH/METRONIDAZ/TETRACYCLIN 140-125-125 MG PO CAPS: 3.0000 | ORAL_CAPSULE | Freq: Four times a day (QID) | ORAL | 0 refills | Status: DC

## 2024-06-25 NOTE — Progress Notes (Signed)
 Belinda Mcintosh OFFICE PROGRESS NOTE  Patient Care Team: Caudle, Thersia Bitters, FNP as PCP - General (Family Medicine)  43 y.o.female with history of appendectomy, kidney stone referred to Hematology for thrombocytopenia.    History showed mild chronic thrombocytopenia.  This has not changed for years, no clinical concerns. Recent labs showed normal B12.   Of note she reports leg cramping, previous electrolytes were normal.  Unclear to why she has some cramping, will check iron storage today.  Recent findings of H pylori. Expected malabsorption on iron and b12 like in the few months. Assessment & Plan Thrombocytopenia (HCC) Chronic, unchanged dated to 2018 from [facility].  Does not expect symptoms. Repeat B12 and MMA, folate Low iron stores Recommend gentle iron daily with Vitamin C tablet.   Treat H pylori per GI, she is starting today Follow-up in 6 months with repeat CBC and ferritin.  Orders Placed This Encounter  Procedures   Folate    Standing Status:   Future    Expiration Date:   06/26/2025   Vitamin B12    Standing Status:   Future    Expiration Date:   06/26/2025   Iron and Iron Binding Capacity ([facility] only)    Standing Status:   Future    Expiration Date:   06/26/2025   Methylmalonic acid, serum    Standing Status:   Future    Expiration Date:   07/27/2024     [name], MD  INTERVAL HISTORY: Patient returns for follow-up.   She is not taking iron. Report some food stuck and stopped her iron. She was found to have H pylori recently. She went to [place] for work up for MS and was negative. She returned and tried iron and had indigestion. She thought iron causing palpitation.  2 days of menstrual bleeding, not heavy. Changing 2-3 pads a day. 2nd day and 3rd days are heavier soaked with blood.  No bloody stool or dark stool.  PHYSICAL EXAMINATION:   Vitals:   06/26/24 0950  BP: 105/71  Pulse: 71  Resp: 17  Temp: 97.7 F (36.5 C)  SpO2:  97%   Filed Weights   06/26/24 0950  Weight: 139 lb 6.4 oz (63.2 kg)    GENERAL: alert, no distress and comfortable SKIN: skin color normal and no bruising or petechiae or jaundice on exposed skin EYES: normal, sclera clear  Relevant data reviewed during this visit included labs. New labs ordered.

## 2024-06-25 NOTE — Assessment & Plan Note (Addendum)
 Chronic, unchanged dated to 2018 from [facility].  Does not expect symptoms. Repeat B12 and MMA, folate

## 2024-06-26 ENCOUNTER — Inpatient Hospital Stay (HOSPITAL_BASED_OUTPATIENT_CLINIC_OR_DEPARTMENT_OTHER): Payer: No Typology Code available for payment source

## 2024-06-26 VITALS — BP 105/71 | HR 71 | Temp 97.7°F | Resp 17 | Wt 139.4 lb

## 2024-06-26 DIAGNOSIS — D696 Thrombocytopenia, unspecified: Secondary | ICD-10-CM | POA: Diagnosis not present

## 2024-06-26 DIAGNOSIS — R790 Abnormal level of blood mineral: Secondary | ICD-10-CM | POA: Diagnosis not present

## 2024-06-26 MED ORDER — OMEPRAZOLE 40 MG PO CPDR: 40.0000 mg | DELAYED_RELEASE_CAPSULE | Freq: Two times a day (BID) | ORAL | 0 refills | Status: DC

## 2024-06-26 MED ORDER — BISMUTH SUBSALICYLATE 262 MG PO TABS: 262.0000 mg | ORAL_TABLET | Freq: Four times a day (QID) | ORAL | 0 refills | Status: DC

## 2024-06-26 MED ORDER — TETRACYCLINE HCL 500 MG PO CAPS: 500.0000 mg | ORAL_CAPSULE | Freq: Four times a day (QID) | ORAL | 0 refills | Status: DC

## 2024-06-26 NOTE — Telephone Encounter (Signed)
 Do you want us  to try the PA for this rx or do the break down drugs. Please advise. Thank you.

## 2024-06-26 NOTE — Assessment & Plan Note (Addendum)
 Recommend gentle iron daily with Vitamin C tablet.   Treat H pylori per GI, she is starting today Follow-up in 6 months with repeat CBC and ferritin.

## 2024-06-26 NOTE — Telephone Encounter (Signed)
 Per Camie Furbish, PA the pylera rx has been broken down as follows:   Pepto bismal 262mg  QID #56 Metronidazole  250mg  QID #56 Tetracycline  500mg  QID #56 Omeprazole  40mg  BID #28  I used pacific interpreters to try and reach her. Her voice mail is full and we were unable to leave her a message. I tried to reach the [facility] and tell them however they are currently closed for lunch. I will try again after 2 pm.  I called [facility] back and spoke with the pharmacist Sunjay. He said he would pass on the message about the medicine change when she comes to get it. He said he had already spoken to her the first time she came in about picking up the pylera. I told him to have her hold the pantoprazole  that she is on while on the omeprazole .

## 2024-06-27 ENCOUNTER — Inpatient Hospital Stay: Payer: Self-pay

## 2024-06-27 DIAGNOSIS — D696 Thrombocytopenia, unspecified: Secondary | ICD-10-CM | POA: Diagnosis not present

## 2024-06-27 DIAGNOSIS — R790 Abnormal level of blood mineral: Secondary | ICD-10-CM

## 2024-06-27 LAB — VITAMIN B12: Vitamin B-12: 337 pg/mL (ref 180–914)

## 2024-06-27 LAB — IRON AND IRON BINDING CAPACITY (CC-WL,HP ONLY)
Iron: 46 ug/dL (ref 28–170)
Saturation Ratios: 14 % (ref 10.4–31.8)
TIBC: 342 ug/dL (ref 250–450)
UIBC: 296 ug/dL (ref 148–442)

## 2024-07-03 ENCOUNTER — Inpatient Hospital Stay (HOSPITAL_COMMUNITY): Admission: RE | Admit: 2024-07-03 | Source: Ambulatory Visit

## 2024-07-05 LAB — METHYLMALONIC ACID, SERUM: Methylmalonic Acid, Quantitative: 112 nmol/L (ref 0–378)

## 2024-07-07 ENCOUNTER — Ambulatory Visit: Payer: Self-pay

## 2024-07-07 DIAGNOSIS — D696 Thrombocytopenia, unspecified: Secondary | ICD-10-CM

## 2024-07-08 NOTE — Telephone Encounter (Signed)
-----   Message from [name] sent at 07/07/2024  4:32 PM EDT ----- Please let her know her labs look stable, no new concerns.  Will plan on follow-up in 6 months.  Lab a week before visit. Thank you. ----- Message ----- From: Rebecka, Lab In [place] Sent: 06/27/2024   9:53 AM EDT To: [name], MD

## 2024-07-08 NOTE — Telephone Encounter (Signed)
 Notified of message below using an interpreter.  Wants to know if she can take Vitamin B 12. She is still feeling and dizzy.

## 2024-07-11 ENCOUNTER — Other Ambulatory Visit (HOSPITAL_BASED_OUTPATIENT_CLINIC_OR_DEPARTMENT_OTHER): Payer: Self-pay | Admitting: Certified Nurse Midwife

## 2024-07-11 MED ORDER — VALACYCLOVIR HCL 1 G PO TABS
1000.0000 mg | ORAL_TABLET | Freq: Two times a day (BID) | ORAL | 0 refills | Status: DC
Start: 2024-07-11 — End: 2024-09-29

## 2024-07-11 NOTE — Telephone Encounter (Signed)
 PC to patient using Spanish interpreter, informed her she may take Vitamin B 12 vitamins per Dr. Tina.  She verbalizes understanding.

## 2024-07-11 NOTE — Telephone Encounter (Signed)
-----   Message from Nurse Tammi H sent at 07/09/2024  8:28 AM EDT ----- Dr Tina. She wants to know if she can take B 12 ----- Message ----- From: Tina Pauletta BROCKS, MD Sent: 07/07/2024   4:32 PM EDT To: Zorita DELENA Croak, RN  Please let her know her labs look stable, no new concerns.  Will plan on follow-up in 6 months.  Lab a week before visit. Thank you. ----- Message ----- From: Rebecka, Lab In [place] Sent: 06/27/2024   9:53 AM EDT To: Pauletta BROCKS Tina, MD

## 2024-07-22 ENCOUNTER — Ambulatory Visit: Payer: Self-pay | Admitting: Gastroenterology

## 2024-07-22 ENCOUNTER — Encounter: Payer: Self-pay | Admitting: Gastroenterology

## 2024-07-22 VITALS — BP 96/68 | HR 93 | Ht 61.0 in | Wt 142.0 lb

## 2024-07-22 DIAGNOSIS — Z8639 Personal history of other endocrine, nutritional and metabolic disease: Secondary | ICD-10-CM | POA: Diagnosis not present

## 2024-07-22 DIAGNOSIS — R1319 Other dysphagia: Secondary | ICD-10-CM

## 2024-07-22 DIAGNOSIS — R194 Change in bowel habit: Secondary | ICD-10-CM | POA: Diagnosis not present

## 2024-07-22 DIAGNOSIS — R748 Abnormal levels of other serum enzymes: Secondary | ICD-10-CM

## 2024-07-22 DIAGNOSIS — R002 Palpitations: Secondary | ICD-10-CM | POA: Diagnosis not present

## 2024-07-22 DIAGNOSIS — R42 Dizziness and giddiness: Secondary | ICD-10-CM | POA: Diagnosis not present

## 2024-07-22 DIAGNOSIS — R1310 Dysphagia, unspecified: Secondary | ICD-10-CM | POA: Diagnosis not present

## 2024-07-22 DIAGNOSIS — R1013 Epigastric pain: Secondary | ICD-10-CM

## 2024-07-22 DIAGNOSIS — Z8619 Personal history of other infectious and parasitic diseases: Secondary | ICD-10-CM

## 2024-07-22 MED ORDER — OMEPRAZOLE 40 MG PO CPDR: 40.0000 mg | DELAYED_RELEASE_CAPSULE | Freq: Every day | ORAL | 3 refills | Status: DC

## 2024-07-22 NOTE — Progress Notes (Signed)
 Attending Physician's Attestation   I have reviewed the chart.   I agree with the Advanced Practitioner's note, impression, and recommendations with any updates as below. Hopefully we will find that H. pylori is no longer present.  But with her persisting symptoms, it will be interesting to see the barium swallow findings but even if the barium swallow is unremarkable and she continues to have dysphagia symptoms, upper endoscopy would likely be necessary.  She will need optimization to ensure there is no other issues from a cardiac standpoint, so hopefully she will be able to have that workup completed prior.   Aloha Finner, MD [facility] Advanced Endoscopy Office # [phone]

## 2024-07-22 NOTE — Patient Instructions (Addendum)
 Your provider has ordered Diatherix stool testing for you. You have received a kit from our office today containing all necessary supplies to complete this test. Please carefully read the stool collection instructions provided in the kit before opening the accompanying materials. In addition, be sure there is a label providing your full name and date of birth on the puritan opti-swab tube that is supplied in the kit (if you do not see a label with this information on your test tube, please make us  aware before test collection!). After completing the test, you should secure the purtian tube into the specimen biohazard bag. The [facility] E-Req sheet (including date and time of specimen collection) should be placed into the outside pocket of the specimen biohazard bag and returned to the [place] lab (basement floor of [facility]) within 3 days of collection. Please make sure to give the specimen to a staff member at the lab. DO NOT leave the specimen on the counter.  Wait till October 1st to submit   If the specimen date and time (can be found in the upper right boxed portion of the sheet) are not filled out on the E-Req sheet, the test will NOT be performed.   Due to recent changes in healthcare laws, you may see the results of your imaging and laboratory studies on MyChart before your provider has had a chance to review them.  We understand that in some cases there may be results that are confusing or concerning to you. Not all laboratory results come back in the same time frame and the provider may be waiting for multiple results in order to interpret others.  Please give us  48 hours in order for your provider to thoroughly review all the results before contacting the office for clarification of your results.    I appreciate the  opportunity to care for you  Thank You   Camie Heinz,PA-C

## 2024-07-22 NOTE — Progress Notes (Signed)
 Belinda Mcintosh [id number] 09-16-1981   Chief Complaint: Stomach acid, increased stool frequency  Referring Provider: Knute Thersia Bitters, * Primary GI MD: Dr. Wilhelmenia  HPI: Belinda Mcintosh is a 43 y.o. female with past medical history of anxiety, kidney stones, appendectomy, umbilical hernia who presents today for follow up.     Labs 01/30/2024: Platelets 142, otherwise normal CBC, normal CMP, normal TSH, normal vitamin B12   Patient seen in ED 03/25/2024 reporting dizziness, thought to be psychological per patient.  History of MRI brain 12/03/2023 which showed lesions, lumbar puncture negative.  In ED patient reported dizziness started around October 2024.  Multiple personal stressors at that time, also had flushing of her ear canal which cause dizziness.  Has completed 3 weeks of vestibular therapy.  Was prescribed Lexapro  10 mg daily for anxiety, only took medication for a few months and stopped 3 weeks prior to ED visit.   04/01/2024 had visit with PCP and reported jaw pain, noted clicking/popping of the jaw and significant pain.  Has mouthguard but not wearing regularly.   Neuropsych evaluation 05/12/2024.  Somatic complaints and dizziness seen as likely secondary to significant and chronic stress.  See note for more details.  She has been referred to [facility].   05/20/2024 seen by [facility].  Endorsed dizziness and palpitations at that time.  Started on fluoxetine/Prozac.   Initially seen 06/02/2024 for multiple GI complaints.  Endorsed 3 months of intermittent trouble swallowing and epigastric pain which began after starting Lexapro  and improved when she discontinued the medication though symptoms did persist, worsened after starting fluoxetine.  Noted that her husband had been found to have be positive for H. pylori while they were in [place] and was treated, but patient herself was never tested or treated.  She was hesitant to go  through with upper endoscopy due to current problems with dizziness and palpitations, and cardiac evaluation prior to procedures was advised.  Referral sent. She also endorsed loose stools which had worsened recently on iron.  On labs seen to have chronically low platelets, otherwise normal CBC.  Mild elevation in ALT to 41, otherwise normal liver and kidney function.  Iron 142, ferritin 23.8, normal lipase, low vitamin D , normal TSH  She had a negative C. difficile and stool culture, normal fecal calprotectin, but found to be positive for H. pylori and was treated with Pylera equivalent.  Follows with hematology for thrombocytopenia.  Scheduled to have barium swallow tomorrow.   Patient's primary language is Spanish and an interpreter is present for the visit today, though patient speaks English throughout most of the visit.  Patient states she had some changes in her insurance and ran into issues scheduling barium swallow because of this, but is planning to have this done as scheduled tomorrow.  States she completed course of antibiotics, though it is a bit unclear whether she took as prescribed.  States it was a lot to take 4 times daily and at times she took only 3 times daily (?).  Since completing treatment states that her trouble swallowing has improved by 90% and has also had improvement in epigastric pain.  Had previously been having some chest pain which she says is also better since completing treatment for H. pylori.  She denies any acid reflux or heartburn, but does state that she feels hungry all the time and has acid in her stomach if she has not eaten, though she denies any epigastric  pain or burning sensations.  Since completing antibiotic treatment has noticed a change in her bowel habits.  Previously was having 1-2 bowel movements daily, now having 3-4 bowel movements daily with occasional sensation of incomplete evacuation.  Feels she has to go to the bathroom more frequently.   States stools are soft but is not having any diarrhea.  Denies hard stools or straining.  Denies blood in her stool and only sees dark stools if taking iron or Pepto-Bismol.  Has noticed foul-smelling flatulence.  States that she has been having bladder problems and reports diagnosis of interstitial cystitis.  Reports she is having evaluation for this in about a month (delayed due to insurance issues).  States she can have some lower abdominal discomfort because of this which sometimes radiates to her lower back.  She continues to have dizziness and heart palpitations which she describes as hiccups of her heartbeat.  Referral was placed to cardiology at last visit but this is still pending and she has not been contacted by cardiology.  Previous GI Procedures/Imaging   CT A/P 12/30/2023 1. No renal, ureteral or bladder calculi. 2. Mild bladder wall thickening could be due to lack of distension but could not exclude cystitis. Recommend correlation with urinalysis. 3. No other significant abdominal/pelvic findings, mass lesions or adenopathy.   Past Medical History:  Diagnosis Date   Abdominal pain    Acute appendicitis 08/20/2023   AMA (advanced maternal age) multigravida 35+ 05/14/2017   Anxiety    Flank pain    Genital warts complicating pregnancy, third trimester 04/30/2017   [facility] HSV neg.     History of COVID-19 01/24/2021   History of kidney stones    Low back pain    Nasal sore 12/18/2023   Palpitations    Pelvic pain 11/20/2023   S/P laparoscopic appendectomy 11/01/2023   Shortness of breath 02/01/2021   Supervision of high risk pregnancy, antepartum 02/08/2021              Nursing Staff    Provider      Office Location     [id number]    Dating     LMP      Language     English    Anatomy US      WNL      Flu Vaccine          Genetic/Carrier Screen     NIPS:  Low risk Female   AFP:   Negative  Horizon: Negative       TDaP Vaccine      05/20/21     Hgb A1C or   GTT    Early normal 78, 153, 119  Third trimester : normal      COVID Vaccine              LAB RESULTS       SVD (spontaneous vaginal delivery) 08/21/2021   Umbilical hernia 2018    Past Surgical History:  Procedure Laterality Date   APPENDECTOMY     COSMETIC SURGERY     INDUCED ABORTION     LAPAROSCOPIC APPENDECTOMY N/A 08/21/2023   Procedure: APPENDECTOMY LAPAROSCOPIC;  Surgeon: Eletha Boas, MD;  Location: [facility];  Service: General;  Laterality: N/A;   LIPOSUCTION     NOSE SURGERY      Current Outpatient Medications  Medication Sig Dispense Refill   cyclobenzaprine  (FLEXERIL ) 10 MG tablet Take 1 tablet (10 mg total) by mouth at  bedtime. 30 tablet 2   omeprazole  (PRILOSEC) 40 MG capsule Take 1 capsule (40 mg total) by mouth 2 (two) times daily. 28 capsule 0   oxybutynin (DITROPAN) 5 MG tablet Take 5 mg by mouth 2 (two) times daily.     predniSONE (DELTASONE) 20 MG tablet Take 3 pills for 3 days, 2 pills for 3 days, 1 pill for 3 days, 1/2 pill for 3 days.  Do not take with NSAIDs.  Take all pills for that day in the morning with food     sertraline (ZOLOFT) 50 MG tablet Take 50 mg by mouth daily.     tetracycline  (SUMYCIN ) 500 MG capsule Take 1 capsule (500 mg total) by mouth 4 (four) times daily. 56 capsule 0   ATORVASTATIN CALCIUM  PO Take by mouth. (Patient not taking: Reported on 07/22/2024)     Bismuth  Subsalicylate 262 MG TABS Take 1 tablet (262 mg total) by mouth 4 (four) times daily. (Patient not taking: Reported on 07/22/2024) 56 tablet 0   fluconazole  (DIFLUCAN ) 150 MG tablet Take 1 tablet (150 mg total) by mouth every other day as needed. (Patient not taking: Reported on 07/22/2024) 2 tablet 2   FLUoxetine (PROZAC) 20 MG capsule Take 20 mg by mouth every morning. (Patient not taking: Reported on 07/22/2024)     metroNIDAZOLE  (FLAGYL ) 250 MG tablet Take 1 tablet (250 mg total) by mouth 4 (four) times daily for 14 days. (Patient not taking: Reported on 07/22/2024) 56 tablet  0   valACYclovir  (VALTREX ) 1000 MG tablet Take 1 tablet (1,000 mg total) by mouth 2 (two) times daily. Take for 10 days (Patient not taking: Reported on 07/22/2024) 20 tablet 0   No current facility-administered medications for this visit.    Allergies as of 07/22/2024 - Review Complete 07/22/2024  Allergen Reaction Noted   Ivp dye [iodinated contrast media] Anaphylaxis, Hives, and Cough 08/20/2023   Shrimp [shellfish allergy] Itching 11/21/2016   Gabapentin Itching 11/16/2023   Prunus persica Cough 01/30/2024   Rocephin  [ceftriaxone ] Hives 08/20/2023   Tramadol  Anxiety 10/18/2023    Family History  Problem Relation Age of Onset   Hypertension Mother    Diabetes Mother    Breast cancer Mother        Breast Cancer   Cancer Father    Hypertension Father    Diabetes Father    Cancer Sister    Fibromyalgia Sister    Other Sister        spinal stenosis   Alcohol abuse Neg Hx    Arthritis Neg Hx    Asthma Neg Hx    COPD Neg Hx    Depression Neg Hx    Drug abuse Neg Hx    Early death Neg Hx    Heart disease Neg Hx    Hearing loss Neg Hx    Hyperlipidemia Neg Hx    Kidney disease Neg Hx    Learning disabilities Neg Hx    Mental illness Neg Hx    Mental retardation Neg Hx    Miscarriages / Stillbirths Neg Hx    Stroke Neg Hx    Vision loss Neg Hx    Varicose Veins Neg Hx     Social History   Tobacco Use   Smoking status: Former    Current packs/day: 0.00    Types: Cigarettes    Quit date: 09/21/2016    Years since quitting: 7.8    Passive exposure: Never   Smokeless tobacco: Never  Vaping Use  Vaping status: Never Used  Substance Use Topics   Alcohol use: No   Drug use: No     Review of Systems:    Constitutional: No weight loss, fever, chills Cardiovascular: No chest pain, positive palpitations Respiratory: No SOB  Gastrointestinal: See HPI and otherwise negative Neurological: Positive dizziness Hematologic: No bleeding or bruising    Physical Exam:   Vital signs: BP 96/68   Pulse 93   Ht 5' 1 (1.549 m)   Wt 142 lb (64.4 kg)   BMI 26.83 kg/m   Wt Readings from Last 3 Encounters:  07/22/24 142 lb (64.4 kg)  06/26/24 139 lb 6.4 oz (63.2 kg)  06/17/24 139 lb (63 kg)   Constitutional: Pleasant, well-appearing female in NAD, alert and cooperative Head:  Normocephalic and atraumatic.  Eyes: No scleral icterus. Respiratory: Respirations even and unlabored. Lungs clear to auscultation bilaterally.  No wheezes, crackles, or rhonchi.  Cardiovascular:  Regular rate and rhythm. No murmurs. No peripheral edema. Gastrointestinal:  Soft, nondistended, nontender. No rebound or guarding. Normal bowel sounds. No appreciable masses or hepatomegaly. Rectal:  Not performed.  Neurologic:  Alert and oriented x4;  grossly normal neurologically.  Skin:   Dry and intact without significant lesions or rashes. Psychiatric: Oriented to person, place and time. Demonstrates good judgement and reason without abnormal affect or behaviors.   RELEVANT LABS AND IMAGING: CBC    Component Value Date/Time   WBC 5.5 06/23/2024 0924   WBC 7.6 06/02/2024 1204   RBC 4.68 06/23/2024 0924   HGB 13.8 06/23/2024 0924   HGB 11.6 05/20/2021 0841   HCT 41.4 06/23/2024 0924   HCT 35.1 05/20/2021 0841   PLT 114 (L) 06/23/2024 0924   PLT 131 (L) 05/20/2021 0841   MCV 88.5 06/23/2024 0924   MCV 87 05/20/2021 0841   MCH 29.5 06/23/2024 0924   MCHC 33.3 06/23/2024 0924   RDW 12.4 06/23/2024 0924   RDW 12.9 05/20/2021 0841   LYMPHSABS 1.7 06/23/2024 0924   LYMPHSABS 1.6 02/22/2021 0943   MONOABS 0.2 06/23/2024 0924   EOSABS 0.1 06/23/2024 0924   EOSABS 0.1 02/22/2021 0943   BASOSABS 0.0 06/23/2024 0924   BASOSABS 0.0 02/22/2021 0943    CMP     Component Value Date/Time   NA 136 06/02/2024 1204   NA 137 11/18/2020 1702   K 4.1 06/02/2024 1204   CL 104 06/02/2024 1204   CO2 24 06/02/2024 1204   GLUCOSE 87 06/02/2024 1204   BUN 14 06/02/2024 1204   BUN 13  11/18/2020 1702   CREATININE 0.62 06/02/2024 1204   CALCIUM  9.1 06/02/2024 1204   PROT 7.3 06/02/2024 1204   PROT 7.8 11/18/2020 1702   ALBUMIN 4.6 06/02/2024 1204   ALBUMIN 4.2 12/24/2023 1408   AST 21 06/02/2024 1204   ALT 41 (H) 06/02/2024 1204   ALKPHOS 57 06/02/2024 1204   BILITOT 0.6 06/02/2024 1204   BILITOT <0.2 11/18/2020 1702   GFRNONAA >60 12/30/2023 1647   GFRAA 122 11/18/2020 1702     Assessment/Plan:   History of H. pylori infection Epigastric abdominal pain Dysphagia Patient seen today for follow-up of multiple GI complaints.  At last visit endorsed 3 months of intermittent trouble swallowing and epigastric pain which she thought was possibly related to some anxiety medication she was taking.  H. pylori stool test came back positive and she was treated with Pylera equivalent.  Sounds like she did not quite take this as prescribed but states that she did complete  full treatment.  Since treatment, reports significant improvement in epigastric pain, dysphagia, as well as resolution of chest pain. Had some issues with insurance and was unable to complete barium swallow, but this is scheduled for tomorrow and she plans to have the study done. Has been having some acid in her stomach if she has not eaten but denies any pain or burning sensations.  Has stopped taking PPI so we will restart this to see if that helps.  - Plan for barium swallow study tomorrow.  Pending findings may need EGD. - Start omeprazole  40 mg daily - Needs H. pylori stool eradication study 10/1.  Will do Diatherix as she is restarting omeprazole .  Change in bowel habits Patient reports having loose/soft stools, though endorses a sensation of incomplete evacuation and has noticed increased frequency of bowel movements since completing antibiotics.  Having 3-4 bowel movements daily whereas she was having 1-2 bowel movements daily before.  No blood in her stool or melena.  Denies any associated abdominal or  rectal pain with bowel movements.  Could be altered due to recent antibiotic use, though on review of previous note she did endorse some loose stools when on iron.  Stool studies were negative for infection and she had a normal fecal calprotectin.  - Recommend trial of fiber supplement.  If symptoms persist or worsen consider colonoscopy.  Could be done at the same time as EGD if that ends up being ordered.  Elevated liver enzymes On labs at last visit patient found to have mild elevation in ALT to 41, otherwise normal liver enzymes.  Has chronic thrombocytopenia which is followed by hematology.  No abnormalities of the liver seen on CT scan in February.  - Repeat hepatic function panel with further workup if liver enzymes remain elevated.  Dizziness Heart palpitations Patient continues to have intermittent dizziness and heart palpitations, though chest pain has improved since being treated for H. pylori.  She has not yet had cardiology consult for this, though possibly her symptoms are related to anxiety.  - Will follow-up on cardiology referral today.   Camie Furbish, PA-C [facility] 07/22/2024, 11:25 AM  Patient Care Team: Knute Thersia Bitters, FNP as PCP - General (Family Medicine)

## 2024-07-23 ENCOUNTER — Ambulatory Visit (HOSPITAL_COMMUNITY)
Admission: RE | Admit: 2024-07-23 | Discharge: 2024-07-23 | Disposition: A | Discharge status: Home / Self Care | Source: Ambulatory Visit | Attending: Gastroenterology | Admitting: Gastroenterology

## 2024-07-23 DIAGNOSIS — R1310 Dysphagia, unspecified: Secondary | ICD-10-CM | POA: Diagnosis not present

## 2024-07-23 DIAGNOSIS — R1013 Epigastric pain: Secondary | ICD-10-CM | POA: Diagnosis not present

## 2024-07-23 DIAGNOSIS — R1319 Other dysphagia: Secondary | ICD-10-CM | POA: Insufficient documentation

## 2024-07-23 DIAGNOSIS — Z4682 Encounter for fitting and adjustment of non-vascular catheter: Secondary | ICD-10-CM | POA: Diagnosis not present

## 2024-08-11 ENCOUNTER — Ambulatory Visit: Admitting: Obstetrics and Gynecology

## 2024-08-15 ENCOUNTER — Ambulatory Visit: Admitting: Obstetrics and Gynecology

## 2024-08-18 ENCOUNTER — Other Ambulatory Visit (INDEPENDENT_AMBULATORY_CARE_PROVIDER_SITE_OTHER)

## 2024-08-18 DIAGNOSIS — R748 Abnormal levels of other serum enzymes: Secondary | ICD-10-CM | POA: Diagnosis not present

## 2024-08-18 LAB — HEPATIC FUNCTION PANEL
ALT: 31 U/L (ref 0–35)
AST: 18 U/L (ref 0–37)
Albumin: 4.4 g/dL (ref 3.5–5.2)
Alkaline Phosphatase: 65 U/L (ref 39–117)
Bilirubin, Direct: 0.1 mg/dL (ref 0.0–0.3)
Total Bilirubin: 0.3 mg/dL (ref 0.2–1.2)
Total Protein: 7.5 g/dL (ref 6.0–8.3)

## 2024-08-19 ENCOUNTER — Ambulatory Visit: Payer: Self-pay | Admitting: Gastroenterology

## 2024-08-22 DIAGNOSIS — R102 Pelvic and perineal pain unspecified side: Secondary | ICD-10-CM | POA: Diagnosis not present

## 2024-08-22 DIAGNOSIS — N898 Other specified noninflammatory disorders of vagina: Secondary | ICD-10-CM | POA: Diagnosis not present

## 2024-08-29 DIAGNOSIS — G479 Sleep disorder, unspecified: Secondary | ICD-10-CM | POA: Diagnosis not present

## 2024-08-29 DIAGNOSIS — R202 Paresthesia of skin: Secondary | ICD-10-CM | POA: Diagnosis not present

## 2024-08-29 DIAGNOSIS — E559 Vitamin D deficiency, unspecified: Secondary | ICD-10-CM | POA: Diagnosis not present

## 2024-08-29 DIAGNOSIS — R200 Anesthesia of skin: Secondary | ICD-10-CM | POA: Diagnosis not present

## 2024-08-29 DIAGNOSIS — R4589 Other symptoms and signs involving emotional state: Secondary | ICD-10-CM | POA: Diagnosis not present

## 2024-08-29 DIAGNOSIS — R519 Headache, unspecified: Secondary | ICD-10-CM | POA: Diagnosis not present

## 2024-09-02 DIAGNOSIS — F439 Reaction to severe stress, unspecified: Secondary | ICD-10-CM | POA: Diagnosis not present

## 2024-09-02 DIAGNOSIS — F411 Generalized anxiety disorder: Secondary | ICD-10-CM | POA: Diagnosis not present

## 2024-09-02 DIAGNOSIS — F4323 Adjustment disorder with mixed anxiety and depressed mood: Secondary | ICD-10-CM | POA: Diagnosis not present

## 2024-09-11 DIAGNOSIS — F439 Reaction to severe stress, unspecified: Secondary | ICD-10-CM | POA: Diagnosis not present

## 2024-09-11 DIAGNOSIS — F4323 Adjustment disorder with mixed anxiety and depressed mood: Secondary | ICD-10-CM | POA: Diagnosis not present

## 2024-09-11 DIAGNOSIS — F411 Generalized anxiety disorder: Secondary | ICD-10-CM | POA: Diagnosis not present

## 2024-09-29 ENCOUNTER — Encounter (HOSPITAL_BASED_OUTPATIENT_CLINIC_OR_DEPARTMENT_OTHER): Payer: Self-pay | Admitting: Family Medicine

## 2024-09-29 ENCOUNTER — Ambulatory Visit (INDEPENDENT_AMBULATORY_CARE_PROVIDER_SITE_OTHER): Admitting: Family Medicine

## 2024-09-29 VITALS — BP 108/68 | HR 79 | Ht 61.0 in | Wt 148.9 lb

## 2024-09-29 DIAGNOSIS — G8929 Other chronic pain: Secondary | ICD-10-CM | POA: Diagnosis not present

## 2024-09-29 DIAGNOSIS — R208 Other disturbances of skin sensation: Secondary | ICD-10-CM | POA: Diagnosis not present

## 2024-09-29 DIAGNOSIS — E782 Mixed hyperlipidemia: Secondary | ICD-10-CM

## 2024-09-29 DIAGNOSIS — R1031 Right lower quadrant pain: Secondary | ICD-10-CM | POA: Diagnosis not present

## 2024-09-29 MED ORDER — PREGABALIN 75 MG PO CAPS: 75.0000 mg | ORAL_CAPSULE | Freq: Every day | ORAL | 3 refills | Status: AC

## 2024-09-29 NOTE — Patient Instructions (Signed)
 Please return for fasting blood work.  For fasting, if your blood work is in the morning please do not eat any food after midnight.  You may have water or black coffee prior to your lab work.  Please take all regularly prescribed medications even if you are fasting.  If your blood work is in the afternoon, please fast for at least 5 to 6 hours.  You may continue to drink water and/or black coffee prior to your lab work.  Please take all scheduled medications even if you are fasting.
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
## 2024-10-02 LAB — HSV 1 AND 2 AB, IGG
HSV 1 Glycoprotein G Ab, IgG: REACTIVE — AB
HSV 2 IgG, Type Spec: NONREACTIVE

## 2024-10-02 LAB — HIV ANTIBODY (ROUTINE TESTING W REFLEX): HIV Screen 4th Generation wRfx: NONREACTIVE

## 2024-10-02 LAB — RPR: RPR Ser Ql: NONREACTIVE

## 2024-10-02 LAB — HEPATITIS B SURFACE ANTIGEN: Hepatitis B Surface Ag: NEGATIVE

## 2024-10-02 LAB — HEPATITIS C ANTIBODY: Hep C Virus Ab: NONREACTIVE

## 2024-10-09 ENCOUNTER — Encounter (HOSPITAL_BASED_OUTPATIENT_CLINIC_OR_DEPARTMENT_OTHER): Payer: Self-pay | Admitting: Family Medicine

## 2024-10-09 DIAGNOSIS — R208 Other disturbances of skin sensation: Secondary | ICD-10-CM

## 2024-10-09 DIAGNOSIS — E782 Mixed hyperlipidemia: Secondary | ICD-10-CM

## 2024-10-09 DIAGNOSIS — G8929 Other chronic pain: Secondary | ICD-10-CM

## 2024-10-13 ENCOUNTER — Other Ambulatory Visit: Payer: Self-pay | Admitting: Family Medicine

## 2024-10-13 DIAGNOSIS — N6489 Other specified disorders of breast: Secondary | ICD-10-CM

## 2024-10-13 NOTE — Telephone Encounter (Signed)
 Please see mychart feed and advise if you have any recommendations for pt while we wait for her to come to have testing done.

## 2024-10-15 ENCOUNTER — Ambulatory Visit (HOSPITAL_BASED_OUTPATIENT_CLINIC_OR_DEPARTMENT_OTHER): Admitting: Family Medicine

## 2024-10-24 ENCOUNTER — Other Ambulatory Visit: Payer: Self-pay | Admitting: Family Medicine

## 2024-10-24 DIAGNOSIS — N6489 Other specified disorders of breast: Secondary | ICD-10-CM

## 2024-10-30 ENCOUNTER — Encounter

## 2024-10-30 ENCOUNTER — Inpatient Hospital Stay: Admission: RE | Admit: 2024-10-30 | Discharge: 2024-10-30 | Attending: Family Medicine | Admitting: Family Medicine

## 2024-10-30 DIAGNOSIS — N6001 Solitary cyst of right breast: Secondary | ICD-10-CM | POA: Diagnosis not present

## 2024-10-30 DIAGNOSIS — N6489 Other specified disorders of breast: Secondary | ICD-10-CM

## 2024-10-30 DIAGNOSIS — R928 Other abnormal and inconclusive findings on diagnostic imaging of breast: Secondary | ICD-10-CM | POA: Diagnosis not present

## 2024-11-02 ENCOUNTER — Telehealth: Admitting: Family Medicine

## 2024-11-02 ENCOUNTER — Encounter

## 2024-11-02 DIAGNOSIS — R110 Nausea: Secondary | ICD-10-CM

## 2024-11-02 DIAGNOSIS — R197 Diarrhea, unspecified: Secondary | ICD-10-CM

## 2024-11-02 MED ORDER — DICYCLOMINE HCL 10 MG PO CAPS: 10.0000 mg | ORAL_CAPSULE | Freq: Three times a day (TID) | ORAL | 0 refills | Status: AC

## 2024-11-02 MED ORDER — ONDANSETRON HCL 4 MG PO TABS: 4.0000 mg | ORAL_TABLET | Freq: Three times a day (TID) | ORAL | 0 refills | Status: AC | PRN
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
## 2024-11-02 NOTE — Patient Instructions (Signed)
 Diarrhea, Adult Diarrhea is frequent loose and sometimes watery bowel movements. Diarrhea can make you feel weak and cause you to become dehydrated. Dehydration is a condition in which there is not enough water or other fluids in the body. Dehydration can make you tired and thirsty, cause you to have a dry mouth, and decrease how often you urinate. Diarrhea typically lasts 2-3 days. However, it can last longer if it is a sign of something more serious. It is important to treat your diarrhea as told by your health care provider. Follow these instructions at home: Eating and drinking     Follow these recommendations as told by your health care provider: Take an oral rehydration solution (ORS). This is an over-the-counter medicine that helps return your body to its normal balance of nutrients and water. It is found at pharmacies and retail stores. Drink enough fluid to keep your urine pale yellow. Drink fluids such as water, diluted fruit juice, and low-calorie sports drinks. You can drink milk also, if desired. Sucking on ice chips is another way to get fluids. Avoid drinking fluids that contain a lot of sugar or caffeine, such as soda, energy drinks, and regular sports drinks. Avoid alcohol. Eat bland, easy-to-digest foods in small amounts as you are able. These foods include bananas, applesauce, rice, lean meats, toast, and crackers. Avoid spicy or fatty foods.  Medicines Take over-the-counter and prescription medicines only as told by your health care provider. If you were prescribed antibiotics, take them as told by your health care provider. Do not stop using the antibiotic even if you start to feel better. General instructions  Wash your hands often using soap and water for at least 20 seconds. If soap and water are not available, use hand sanitizer. Others in the household should wash their hands as well. Hands should be washed: After using the toilet or changing a diaper. Before  preparing, cooking, or serving food. While caring for a sick person or while visiting someone in a hospital. Rest at home while you recover. Take a warm bath to relieve any burning or pain from frequent diarrhea episodes. Watch your condition for any changes. Contact a health care provider if: You have a fever. Your diarrhea gets worse. You have new symptoms. You vomit every time you eat or drink. You feel light-headed, dizzy, or have a headache. You have muscle cramps. You have signs of dehydration, such as: Dark urine, very little urine, or no urine. Cracked lips. Dry mouth. Sunken eyes. Sleepiness. Weakness. You have bloody or black stools or stools that look like tar. You have severe pain, cramping, or bloating in your abdomen. Your skin feels cold and clammy. You feel confused. Get help right away if: You have chest pain or your heart is beating very quickly. You have trouble breathing or you are breathing very quickly. You feel extremely weak or you faint. These symptoms may be an emergency. Get help right away. Call 911. Do not wait to see if the symptoms will go away. Do not drive yourself to the hospital. This information is not intended to replace advice given to you by your health care provider. Make sure you discuss any questions you have with your health care provider. Document Revised: 04/18/2022 Document Reviewed: 04/18/2022 Elsevier Patient Education  2024 [employer].

## 2024-11-14 ENCOUNTER — Ambulatory Visit: Admitting: Obstetrics and Gynecology

## 2024-11-20 ENCOUNTER — Ambulatory Visit (HOSPITAL_BASED_OUTPATIENT_CLINIC_OR_DEPARTMENT_OTHER): Payer: Self-pay | Admitting: Obstetrics and Gynecology

## 2024-12-01 ENCOUNTER — Ambulatory Visit (HOSPITAL_BASED_OUTPATIENT_CLINIC_OR_DEPARTMENT_OTHER): Payer: Self-pay | Admitting: Family Medicine

## 2024-12-01 NOTE — Progress Notes (Signed)
 Lashawnta,  There is a slight decrease in the likely complicated benign cyst in the right breast from previous imaging. We will plan to repeat your diagnostic mammogram and right breast in 1 year. If you have further questions, please let us  know.

## 2024-12-19 ENCOUNTER — Encounter (HOSPITAL_BASED_OUTPATIENT_CLINIC_OR_DEPARTMENT_OTHER): Payer: Self-pay | Admitting: Family Medicine

## 2025-04-29 ENCOUNTER — Encounter (HOSPITAL_BASED_OUTPATIENT_CLINIC_OR_DEPARTMENT_OTHER): Admitting: Family Medicine
